# Patient Record
Sex: Female | Born: 1935
Health system: Southern US, Community
[De-identification: ages and names within clinical notes are randomized; demographics above are authoritative.]

## PROBLEM LIST (undated history)

## (undated) DIAGNOSIS — J349 Unspecified disorder of nose and nasal sinuses: Secondary | ICD-10-CM

## (undated) DIAGNOSIS — Z789 Other specified health status: Secondary | ICD-10-CM

## (undated) DIAGNOSIS — H9190 Unspecified hearing loss, unspecified ear: Secondary | ICD-10-CM

## (undated) HISTORY — PX: HERNIA REPAIR: SHX51

---

## 1999-05-26 ENCOUNTER — Other Ambulatory Visit: Admission: RE | Admit: 1999-05-26 | Discharge: 1999-05-26 | Payer: Self-pay | Admitting: Obstetrics and Gynecology

## 1999-05-26 ENCOUNTER — Ambulatory Visit (HOSPITAL_COMMUNITY): Admission: RE | Admit: 1999-05-26 | Discharge: 1999-05-26 | Payer: Self-pay | Admitting: Family Medicine

## 1999-05-26 ENCOUNTER — Encounter: Payer: Self-pay | Admitting: Family Medicine

## 2001-07-19 ENCOUNTER — Ambulatory Visit (HOSPITAL_COMMUNITY): Admission: RE | Admit: 2001-07-19 | Discharge: 2001-07-19 | Payer: Self-pay | Admitting: Family Medicine

## 2001-07-19 ENCOUNTER — Encounter: Payer: Self-pay | Admitting: Family Medicine

## 2001-08-11 ENCOUNTER — Encounter: Payer: Self-pay | Admitting: Emergency Medicine

## 2001-08-11 ENCOUNTER — Emergency Department (HOSPITAL_COMMUNITY): Admission: EM | Admit: 2001-08-11 | Discharge: 2001-08-11 | Payer: Self-pay | Admitting: Emergency Medicine

## 2001-08-20 ENCOUNTER — Ambulatory Visit (HOSPITAL_COMMUNITY): Admission: RE | Admit: 2001-08-20 | Discharge: 2001-08-20 | Payer: Self-pay | Admitting: Pulmonary Disease

## 2003-03-19 ENCOUNTER — Ambulatory Visit (HOSPITAL_COMMUNITY): Admission: RE | Admit: 2003-03-19 | Discharge: 2003-03-19 | Payer: Self-pay | Admitting: Obstetrics & Gynecology

## 2003-03-19 ENCOUNTER — Encounter: Payer: Self-pay | Admitting: Obstetrics & Gynecology

## 2003-03-26 ENCOUNTER — Encounter (HOSPITAL_COMMUNITY): Admission: RE | Admit: 2003-03-26 | Discharge: 2003-04-25 | Payer: Self-pay | Admitting: Obstetrics & Gynecology

## 2003-03-26 ENCOUNTER — Encounter: Payer: Self-pay | Admitting: Obstetrics & Gynecology

## 2004-06-13 ENCOUNTER — Emergency Department (HOSPITAL_COMMUNITY): Admission: EM | Admit: 2004-06-13 | Discharge: 2004-06-13 | Payer: Self-pay | Admitting: Emergency Medicine

## 2004-11-30 ENCOUNTER — Ambulatory Visit (HOSPITAL_COMMUNITY): Admission: RE | Admit: 2004-11-30 | Discharge: 2004-11-30 | Payer: Self-pay | Admitting: Family Medicine

## 2004-12-02 ENCOUNTER — Ambulatory Visit: Payer: Self-pay | Admitting: Internal Medicine

## 2004-12-07 ENCOUNTER — Encounter (HOSPITAL_COMMUNITY): Admission: RE | Admit: 2004-12-07 | Discharge: 2004-12-08 | Payer: Self-pay | Admitting: Internal Medicine

## 2004-12-07 ENCOUNTER — Ambulatory Visit: Payer: Self-pay | Admitting: *Deleted

## 2004-12-16 ENCOUNTER — Ambulatory Visit (HOSPITAL_COMMUNITY): Admission: RE | Admit: 2004-12-16 | Discharge: 2004-12-16 | Payer: Self-pay | Admitting: Internal Medicine

## 2004-12-16 ENCOUNTER — Ambulatory Visit: Payer: Self-pay | Admitting: *Deleted

## 2004-12-17 ENCOUNTER — Ambulatory Visit: Payer: Self-pay | Admitting: *Deleted

## 2004-12-21 ENCOUNTER — Ambulatory Visit (HOSPITAL_COMMUNITY): Admission: RE | Admit: 2004-12-21 | Discharge: 2004-12-21 | Payer: Self-pay | Admitting: Family Medicine

## 2005-01-19 ENCOUNTER — Ambulatory Visit: Payer: Self-pay | Admitting: *Deleted

## 2005-07-25 ENCOUNTER — Ambulatory Visit (HOSPITAL_COMMUNITY): Admission: RE | Admit: 2005-07-25 | Discharge: 2005-07-25 | Payer: Self-pay | Admitting: Family Medicine

## 2005-10-31 HISTORY — PX: COLONOSCOPY: SHX174

## 2006-04-04 ENCOUNTER — Ambulatory Visit (HOSPITAL_COMMUNITY): Admission: RE | Admit: 2006-04-04 | Discharge: 2006-04-04 | Payer: Self-pay | Admitting: Family Medicine

## 2006-04-20 ENCOUNTER — Ambulatory Visit (HOSPITAL_COMMUNITY): Admission: RE | Admit: 2006-04-20 | Discharge: 2006-04-20 | Payer: Self-pay | Admitting: Family Medicine

## 2006-05-11 ENCOUNTER — Ambulatory Visit: Payer: Self-pay | Admitting: Internal Medicine

## 2006-05-30 ENCOUNTER — Ambulatory Visit (HOSPITAL_COMMUNITY): Admission: RE | Admit: 2006-05-30 | Discharge: 2006-05-30 | Payer: Self-pay | Admitting: Internal Medicine

## 2006-05-30 ENCOUNTER — Ambulatory Visit: Payer: Self-pay | Admitting: Internal Medicine

## 2010-11-21 ENCOUNTER — Encounter: Payer: Self-pay | Admitting: *Deleted

## 2010-12-28 ENCOUNTER — Other Ambulatory Visit (HOSPITAL_COMMUNITY): Payer: Self-pay | Admitting: Family Medicine

## 2010-12-28 ENCOUNTER — Ambulatory Visit (HOSPITAL_COMMUNITY)
Admission: RE | Admit: 2010-12-28 | Discharge: 2010-12-28 | Disposition: A | Discharge status: Home / Self Care | Payer: Medicare Other | Source: Ambulatory Visit | Attending: Family Medicine | Admitting: Family Medicine

## 2010-12-28 DIAGNOSIS — R52 Pain, unspecified: Secondary | ICD-10-CM

## 2010-12-28 DIAGNOSIS — M79609 Pain in unspecified limb: Secondary | ICD-10-CM | POA: Insufficient documentation

## 2012-01-12 ENCOUNTER — Other Ambulatory Visit (HOSPITAL_COMMUNITY): Payer: Self-pay | Admitting: Family Medicine

## 2012-01-12 ENCOUNTER — Ambulatory Visit (HOSPITAL_COMMUNITY)
Admission: RE | Admit: 2012-01-12 | Discharge: 2012-01-12 | Disposition: A | Discharge status: Home / Self Care | Payer: Medicare Other | Source: Ambulatory Visit | Attending: Family Medicine | Admitting: Family Medicine

## 2012-01-12 DIAGNOSIS — M25551 Pain in right hip: Secondary | ICD-10-CM

## 2012-01-12 DIAGNOSIS — W19XXXA Unspecified fall, initial encounter: Secondary | ICD-10-CM

## 2012-01-12 DIAGNOSIS — M25559 Pain in unspecified hip: Secondary | ICD-10-CM | POA: Insufficient documentation

## 2012-01-12 DIAGNOSIS — M898X5 Other specified disorders of bone, thigh: Secondary | ICD-10-CM

## 2012-05-08 ENCOUNTER — Other Ambulatory Visit: Payer: Self-pay | Admitting: Obstetrics & Gynecology

## 2012-05-08 MED ORDER — KETOROLAC TROMETHAMINE 30 MG/ML IJ SOLN: 30.0000 mg | Freq: Once | INTRAMUSCULAR | Status: DC

## 2012-05-09 ENCOUNTER — Encounter (HOSPITAL_COMMUNITY): Payer: Self-pay | Admitting: Pharmacy Technician

## 2012-05-18 ENCOUNTER — Encounter (HOSPITAL_COMMUNITY)
Admission: RE | Admit: 2012-05-18 | Discharge: 2012-05-18 | Disposition: A | Discharge status: Home / Self Care | Payer: Medicare Other | Source: Ambulatory Visit | Attending: Obstetrics & Gynecology | Admitting: Obstetrics & Gynecology

## 2012-05-18 ENCOUNTER — Other Ambulatory Visit: Payer: Self-pay

## 2012-05-18 ENCOUNTER — Encounter (HOSPITAL_COMMUNITY): Payer: Self-pay

## 2012-05-18 HISTORY — DX: Other specified health status: Z78.9

## 2012-05-18 HISTORY — DX: Unspecified disorder of nose and nasal sinuses: J34.9

## 2012-05-18 HISTORY — DX: Unspecified hearing loss, unspecified ear: H91.90

## 2012-05-18 LAB — COMPREHENSIVE METABOLIC PANEL
ALT: 13 U/L (ref 0–35)
AST: 18 U/L (ref 0–37)
Albumin: 4.3 g/dL (ref 3.5–5.2)
CO2: 32 mEq/L (ref 19–32)
Calcium: 10.6 mg/dL — ABNORMAL HIGH (ref 8.4–10.5)
Chloride: 99 mEq/L (ref 96–112)
GFR calc non Af Amer: 78 mL/min — ABNORMAL LOW (ref >90)
Sodium: 140 mEq/L (ref 135–145)
Total Bilirubin: 0.7 mg/dL (ref 0.3–1.2)

## 2012-05-18 LAB — CBC
Platelets: 204 10*3/uL (ref 150–400)
RBC: 4.56 MIL/uL (ref 3.87–5.11)
RDW: 12.9 % (ref 11.5–15.5)
WBC: 6.5 10*3/uL (ref 4.0–10.5)

## 2012-05-18 LAB — SURGICAL PCR SCREEN
MRSA, PCR: NEGATIVE
Staphylococcus aureus: NEGATIVE

## 2012-05-18 NOTE — Patient Instructions (Addendum)
22 Candice Ray  05/18/2012   Your procedure is scheduled on:  Wednesday, 05/23/12  Report to [facility] at 0700 AM.  Call this number if you have problems the morning of surgery: [phone]   Remember:   Do not eat food:After Midnight.  May have clear liquids:until Midnight .  NOTHING after midnight, Tuesday!  Take these medicines the morning of surgery with A SIP OF WATER: Please DO NOT take any of your diabetic medications. We will check your blood sugar upon arrival to the short stay area.   Do not wear jewelry, make-up or nail polish.  Do not wear lotions, powders, or perfumes. You may wear deodorant.  Do not shave 48 hours prior to surgery. Men may shave face and neck.  Do not bring valuables to the hospital.  Contacts, dentures or bridgework may not be worn into surgery.  Leave suitcase in the car. After surgery it may be brought to your room.  For patients admitted to the hospital, checkout time is 11:00 AM the day of discharge.   Patients discharged the day of surgery will not be allowed to drive home.  Name and phone number of your driver: family  Special Instructions: CHG Shower Use Special Wash: 1/2 bottle night before surgery and 1/2 bottle morning of surgery.   Please read over the following fact sheets that you were given: Pain Booklet, MRSA Information, Surgical Site Infection Prevention, Anesthesia Post-op Instructions and Care and Recovery After Surgery   Endometrial Ablation Endometrial ablation removes the lining of the uterus (endometrium). It is usually a same day, outpatient treatment. Ablation helps avoid major surgery (such as a hysterectomy). A hysterectomy is removal of the cervix and uterus. Endometrial ablation has less risk and complications, has a shorter recovery period and is less expensive. After endometrial ablation, most women will have little or no menstrual bleeding. You may not keep your fertility. Pregnancy is no longer likely after this procedure  but if you are pre-menopausal, you still need to use a reliable method of birth control following the procedure because pregnancy can occur. REASONS TO HAVE THE PROCEDURE MAY INCLUDE:  Heavy periods.   Bleeding that is causing anemia.   Anovulatory bleeding, very irregular, bleeding.   Bleeding submucous fibroids (on the lining inside the uterus) if they are smaller than 3 centimeters.  REASONS NOT TO HAVE THE PROCEDURE MAY INCLUDE:  You wish to have more children.   You have a pre-cancerous or cancerous problem. The cause of any abnormal bleeding must be diagnosed before having the procedure.   You have pain coming from the uterus.   You have a submucus fibroid larger than 3 centimeters.   You recently had a baby.   You recently had an infection in the uterus.   You have a severe retro-flexed, tipped uterus and cannot insert the instrument to do the ablation.   You had a Cesarean section or deep major surgery on the uterus.   The inner cavity of the uterus is too large for the endometrial ablation instrument.  RISKS AND COMPLICATIONS   Perforation of the uterus.   Bleeding.   Infection of the uterus, bladder or vagina.   Injury to surrounding organs.   Cutting the cervix.   An air bubble to the lung (air embolus).   Pregnancy following the procedure.   Failure of the procedure to help the problem requiring hysterectomy.   Decreased ability to diagnose cancer in the lining of the uterus.  BEFORE  THE PROCEDURE  The lining of the uterus must be tested to make sure there is no pre-cancerous or cancer cells present.   Medications may be given to make the lining of the uterus thinner.   Ultrasound may be used to evaluate the size and look for abnormalities of the uterus.   Future pregnancy is not desired.  PROCEDURE  There are different ways to destroy the lining of the uterus.   Resectoscope - radio frequency-alternating electric current is the most common one  used.   Cryotherapy - freezing the lining of the uterus.   Heated Free Liquid - heated salt (saline) solution inserted into the uterus.   Microwave - uses high energy microwaves in the uterus.   Thermal Balloon - a catheter with a balloon tip is inserted into the uterus and filled with heated fluid.  Your caregiver will talk with you about the method used in this clinic. They will also instruct you on the pros and cons of the procedure. Endometrial ablation is performed along with a procedure called operative hysteroscopy. A narrow viewing tube is inserted through the birth canal (vagina) and through the cervix into the uterus. A tiny camera attached to the viewing tube (hysteroscope) allows the uterine cavity to be shown on a TV monitor during surgery. Your uterus is filled with a harmless liquid to make the procedure easier. The lining of the uterus is then removed. The lining can also be removed with a resectoscope which allows your surgeon to cut away the lining of the uterus under direct vision. Usually, you will be able to go home within an hour after the procedure. HOME CARE INSTRUCTIONS   Do not drive for 24 hours.   No tampons, douching or intercourse for 2 weeks or until your caregiver approves.   Rest at home for 24 to 48 hours. You may then resume normal activities unless told differently by your caregiver.   Take your temperature two times a day for 4 days, and record it.   Take any medications your caregiver has ordered, as directed.   Use some form of contraception if you are pre-menopausal and do not want to get pregnant.  Bleeding after the procedure is normal. It varies from light spotting and mildly watery to bloody discharge for 4 to 6 weeks. You may also have mild cramping. Only take over-the-counter or prescription medicines for pain, discomfort, or fever as directed by your caregiver. Do not use aspirin, as this may aggravate bleeding. Frequent urination during the first  24 hours is normal. You will not know how effective your surgery is until at least 3 months after the surgery. SEEK IMMEDIATE MEDICAL CARE IF:   Bleeding is heavier than a normal menstrual cycle.   An oral temperature above 102 F (38.9 C) develops.   You have increasing cramps or pains not relieved with medication or develop belly (abdominal) pain which does not seem to be related to the same area of earlier cramping and pain.   You are light headed, weak or have fainting episodes.   You develop pain in the shoulder strap areas.   You have chest or leg pain.   You have abnormal vaginal discharge.   You have painful urination.  Document Released: 08/26/2004 Document Revised: 10/06/2011 Document Reviewed: 11/24/2007 [facility] Patient Information 2012 [place], [place].Dilation and Curettage or Vacuum Curettage Care After These instructions give you information on caring for yourself after your procedure. Your doctor may also give you more specific instructions.  Call your doctor if you have any problems or questions after your procedure. HOME CARE  Do not drive for 24 hours.   Return to normal activities in 1 week.   Take your temperature 2 times a day for 4 days. Write it down. Tell your doctor if you have a fever.   Do not stand for a long time.   Do not lift, push, or pull anything over 10 pounds (4.5 kilograms).   Limit stair climbing to once or twice a day.   Rest often.   Continue with your usual diet.   Drink enough fluids to keep your pee (urine) clear or pale yellow.   If you have dry, hard poop (constipation), you may:   Take a medicine to help you go poop (laxative) as told by your doctor.   Add fruit and bran to your diet.   Take showers, not baths, for as long as told by your doctor.   Do not swim or use a hot tub until your doctor says it is okay.   Have someone with you for 2 to 4 days after the procedure.   Do not douche, use tampons, or have sex  (intercourse) until your doctor says it is okay.   Only take medicines as told by your doctor. Do not take aspirin. It can cause bleeding.   Keep all doctor visits.  GET HELP RIGHT AWAY IF:   You start to bleed more than a regular period.   You have a fever.   You have chest pain.   You have trouble breathing.   You feel dizzy or feel like passing out (fainting).   You pass out.   You have pain in the tops of your shoulders.   You have vaginal bleeding with or without clumps (clots) of blood.   You have cramps or pain not helped by medicine.   You have belly (abdominal) pain.   You have a bad smelling fluid coming from your vagina.   You get a rash.   You have problems with any medicine.  MAKE SURE YOU:   Understand these instructions.   Will watch your condition.   Will get help right away if you are not doing well or get worse.  Document Released: 07/26/2008 Document Revised: 10/06/2011 Document Reviewed: 07/26/2008 [facility] Patient Information 2012 [place], [place].

## 2012-05-23 ENCOUNTER — Ambulatory Visit (HOSPITAL_COMMUNITY): Payer: Medicare Other | Admitting: Anesthesiology

## 2012-05-23 ENCOUNTER — Encounter (HOSPITAL_COMMUNITY)
Admission: RE | Disposition: A | Discharge status: Home / Self Care | Payer: Self-pay | Source: Ambulatory Visit | Attending: Obstetrics & Gynecology

## 2012-05-23 ENCOUNTER — Encounter (HOSPITAL_COMMUNITY): Payer: Self-pay | Admitting: Anesthesiology

## 2012-05-23 ENCOUNTER — Ambulatory Visit (HOSPITAL_COMMUNITY)
Admission: RE | Admit: 2012-05-23 | Discharge: 2012-05-23 | Disposition: A | Discharge status: Home / Self Care | Payer: Medicare Other | Source: Ambulatory Visit | Attending: Obstetrics & Gynecology | Admitting: Obstetrics & Gynecology

## 2012-05-23 ENCOUNTER — Encounter (HOSPITAL_COMMUNITY): Payer: Self-pay | Admitting: *Deleted

## 2012-05-23 DIAGNOSIS — N840 Polyp of corpus uteri: Secondary | ICD-10-CM

## 2012-05-23 DIAGNOSIS — Z01812 Encounter for preprocedural laboratory examination: Secondary | ICD-10-CM | POA: Insufficient documentation

## 2012-05-23 DIAGNOSIS — Z01810 Encounter for preprocedural cardiovascular examination: Secondary | ICD-10-CM | POA: Insufficient documentation

## 2012-05-23 DIAGNOSIS — E119 Type 2 diabetes mellitus without complications: Secondary | ICD-10-CM | POA: Insufficient documentation

## 2012-05-23 HISTORY — PX: HYSTEROSCOPY WITH D & C: SHX1775

## 2012-05-23 HISTORY — PX: POLYPECTOMY: SHX5525

## 2012-05-23 LAB — GLUCOSE, CAPILLARY: Glucose-Capillary: 174 mg/dL — ABNORMAL HIGH (ref 70–99)

## 2012-05-23 SURGERY — POLYPECTOMY
Anesthesia: General | Site: Uterus | Wound class: Clean Contaminated

## 2012-05-23 MED ORDER — ONDANSETRON HCL 4 MG/2ML IJ SOLN: 4.0000 mg | Freq: Once | INTRAMUSCULAR | Status: DC | PRN

## 2012-05-23 MED ORDER — KETOROLAC TROMETHAMINE 30 MG/ML IJ SOLN
INTRAMUSCULAR | Status: AC
  Filled 2012-05-23: qty 1

## 2012-05-23 MED ORDER — CEFAZOLIN SODIUM-DEXTROSE 2-3 GM-% IV SOLR: 2.0000 g | INTRAVENOUS | Status: DC

## 2012-05-23 MED ORDER — LACTATED RINGERS IV SOLN
INTRAVENOUS | Status: DC
  Administered 2012-05-23: 1000 mL via INTRAVENOUS

## 2012-05-23 MED ORDER — FENTANYL CITRATE 0.05 MG/ML IJ SOLN
INTRAMUSCULAR | Status: AC
  Filled 2012-05-23: qty 2

## 2012-05-23 MED ORDER — FENTANYL CITRATE 0.05 MG/ML IJ SOLN: 25.0000 ug | INTRAMUSCULAR | Status: DC | PRN

## 2012-05-23 MED ORDER — 0.9 % SODIUM CHLORIDE (POUR BTL) OPTIME
TOPICAL | Status: DC | PRN
  Administered 2012-05-23: 1000 mL

## 2012-05-23 MED ORDER — MIDAZOLAM HCL 2 MG/2ML IJ SOLN
1.0000 mg | INTRAMUSCULAR | Status: DC | PRN
  Administered 2012-05-23: 2 mg via INTRAVENOUS

## 2012-05-23 MED ORDER — LIDOCAINE HCL (PF) 1 % IJ SOLN
INTRAMUSCULAR | Status: AC
  Filled 2012-05-23: qty 5

## 2012-05-23 MED ORDER — MIDAZOLAM HCL 2 MG/2ML IJ SOLN
INTRAMUSCULAR | Status: AC
  Filled 2012-05-23: qty 2

## 2012-05-23 MED ORDER — PROPOFOL 10 MG/ML IV BOLUS
INTRAVENOUS | Status: DC | PRN
  Administered 2012-05-23: 120 mg via INTRAVENOUS

## 2012-05-23 MED ORDER — FENTANYL CITRATE 0.05 MG/ML IJ SOLN
INTRAMUSCULAR | Status: DC | PRN
  Administered 2012-05-23: 50 ug via INTRAVENOUS
  Administered 2012-05-23: 25 ug via INTRAVENOUS

## 2012-05-23 MED ORDER — ONDANSETRON HCL 8 MG PO TABS: 8.0000 mg | ORAL_TABLET | Freq: Three times a day (TID) | ORAL | Status: AC | PRN

## 2012-05-23 MED ORDER — SODIUM CHLORIDE 0.9 % IR SOLN
Status: DC | PRN
  Administered 2012-05-23: 3000 mL

## 2012-05-23 MED ORDER — ONDANSETRON HCL 4 MG/2ML IJ SOLN
INTRAMUSCULAR | Status: AC
  Filled 2012-05-23: qty 2

## 2012-05-23 MED ORDER — HYDROCODONE-ACETAMINOPHEN 5-500 MG PO TABS: 1.0000 | ORAL_TABLET | Freq: Four times a day (QID) | ORAL | Status: AC | PRN

## 2012-05-23 MED ORDER — CEFAZOLIN SODIUM 1-5 GM-% IV SOLN
INTRAVENOUS | Status: DC | PRN
  Administered 2012-05-23: 2 g via INTRAVENOUS

## 2012-05-23 MED ORDER — LIDOCAINE HCL 1 % IJ SOLN
INTRAMUSCULAR | Status: DC | PRN
  Administered 2012-05-23: 50 mg via INTRADERMAL

## 2012-05-23 MED ORDER — CEFAZOLIN SODIUM-DEXTROSE 2-3 GM-% IV SOLR
INTRAVENOUS | Status: AC
  Filled 2012-05-23: qty 50

## 2012-05-23 MED ORDER — KETOROLAC TROMETHAMINE 10 MG PO TABS: 10.0000 mg | ORAL_TABLET | Freq: Three times a day (TID) | ORAL | Status: AC | PRN

## 2012-05-23 MED ORDER — PROPOFOL 10 MG/ML IV EMUL
INTRAVENOUS | Status: AC
  Filled 2012-05-23: qty 20

## 2012-05-23 MED ORDER — KETOROLAC TROMETHAMINE 30 MG/ML IJ SOLN
30.0000 mg | Freq: Once | INTRAMUSCULAR | Status: AC
  Administered 2012-05-23: 30 mg via INTRAVENOUS

## 2012-05-23 SURGICAL SUPPLY — 37 items
BAG DECANTER FOR FLEXI CONT (MISCELLANEOUS) IMPLANT
BAG HAMPER (MISCELLANEOUS) ×3 IMPLANT
BLADE SURG 15 STRL LF DISP TIS (BLADE) IMPLANT
BLADE SURG 15 STRL SS (BLADE)
CATH THERMACHOICE III (CATHETERS) IMPLANT
CLOTH BEACON ORANGE TIMEOUT ST (SAFETY) ×3 IMPLANT
COVER LIGHT HANDLE STERIS (MISCELLANEOUS) ×6 IMPLANT
FORMALIN 10 PREFIL 120ML (MISCELLANEOUS) ×3 IMPLANT
FORMALIN 10 PREFIL 480ML (MISCELLANEOUS) IMPLANT
GAUZE SPONGE 4X4 16PLY XRAY LF (GAUZE/BANDAGES/DRESSINGS) ×3 IMPLANT
GLOVE BIOGEL PI IND STRL 7.0 (GLOVE) ×4 IMPLANT
GLOVE BIOGEL PI IND STRL 8 (GLOVE) ×2 IMPLANT
GLOVE BIOGEL PI INDICATOR 7.0 (GLOVE) ×2
GLOVE BIOGEL PI INDICATOR 8 (GLOVE) ×1
GLOVE ECLIPSE 6.5 STRL STRAW (GLOVE) ×3 IMPLANT
GLOVE ECLIPSE 8.0 STRL XLNG CF (GLOVE) ×3 IMPLANT
GLOVE EXAM NITRILE MD LF STRL (GLOVE) ×3 IMPLANT
GOWN STRL REIN XL XLG (GOWN DISPOSABLE) ×9 IMPLANT
INST SET HYSTEROSCOPY (KITS) ×3 IMPLANT
IV D5W 500ML (IV SOLUTION) IMPLANT
IV NS IRRIG 3000ML ARTHROMATIC (IV SOLUTION) ×3 IMPLANT
KIT ROOM TURNOVER AP CYSTO (KITS) ×3 IMPLANT
KIT ROOM TURNOVER APOR (KITS) IMPLANT
MANIFOLD NEPTUNE II (INSTRUMENTS) ×3 IMPLANT
MARKER SKIN DUAL TIP RULER LAB (MISCELLANEOUS) ×3 IMPLANT
NS IRRIG 1000ML POUR BTL (IV SOLUTION) ×3 IMPLANT
PACK BASIC III (CUSTOM PROCEDURE TRAY) ×1
PACK PERI GYN (CUSTOM PROCEDURE TRAY) IMPLANT
PACK SRG BSC III STRL LF ECLPS (CUSTOM PROCEDURE TRAY) ×2 IMPLANT
PAD ARMBOARD 7.5X6 YLW CONV (MISCELLANEOUS) ×3 IMPLANT
PAD TELFA 3X4 1S STER (GAUZE/BANDAGES/DRESSINGS) ×3 IMPLANT
SET BASIN LINEN APH (SET/KITS/TRAYS/PACK) ×3 IMPLANT
SET IRRIG Y TYPE TUR BLADDER L (SET/KITS/TRAYS/PACK) ×3 IMPLANT
SHEET LAVH (DRAPES) ×3 IMPLANT
SUT MNCRL 0 VIOLET CTX 36 (SUTURE) IMPLANT
SUT MONOCRYL 0 CTX 36 (SUTURE)
YANKAUER SUCT BULB TIP 10FT TU (MISCELLANEOUS) ×3 IMPLANT

## 2012-05-23 NOTE — H&P (Signed)
Candice Ray is an 78 y.o. female long menopausal who had evaluation for pelvic pain which included an ultrasound which revealed an endometrial polyp.  Biopsy in the office was consistent with that and was benign.  We will do an operative hysteroscopy and removal of polyp and uterine curettage.  No LMP recorded. Patient is postmenopausal.    Past Medical History  Diagnosis Date  . Diabetes mellitus   . Sinus trouble   . HOH (hard of hearing)   . Poor historian     Past Surgical History  Procedure Date  . Hernia repair     umbilical, [facility]    History reviewed. No pertinent family history.  Social History:  reports that she has never smoked. She does not have any smokeless tobacco history on file. She reports that she does not drink alcohol or use illicit drugs.  Allergies:  Allergies  Allergen Reactions  . Contrast Media (Iodinated Diagnostic Agents) Swelling    Prescriptions prior to admission  Medication Sig Dispense Refill  . Calcium Carbonate-Vitamin D (CALTRATE 600+D PO) Take 1 tablet by mouth 2 (two) times daily.      . metFORMIN (GLUCOPHAGE) 500 MG tablet Take 500 mg by mouth 2 (two) times daily.      . Multiple Vitamin (MULTIVITAMIN WITH MINERALS) TABS Take 1 tablet by mouth daily.      . saxagliptin HCl (ONGLYZA) 5 MG TABS tablet Take 5 mg by mouth daily.        ROS  Review of Systems  Constitutional: Negative for fever, chills, weight loss, malaise/fatigue and diaphoresis.  HENT: Negative for hearing loss, ear pain, nosebleeds, congestion, sore throat, neck pain, tinnitus and ear discharge.   Eyes: Negative for blurred vision, double vision, photophobia, pain, discharge and redness.  Respiratory: Negative for cough, hemoptysis, sputum production, shortness of breath, wheezing and stridor.   Cardiovascular: Negative for chest pain, palpitations, orthopnea, claudication, leg swelling and PND.  Gastrointestinal: Positive for abdominal pain. Negative for heartburn,  nausea, vomiting, diarrhea, constipation, blood in stool and melena.  Genitourinary: Negative for dysuria, urgency, frequency, hematuria and flank pain.  Musculoskeletal: Negative for myalgias, back pain, joint pain and falls.  Skin: Negative for itching and rash.  Neurological: Negative for dizziness, tingling, tremors, sensory change, speech change, focal weakness, seizures, loss of consciousness, weakness and headaches.  Endo/Heme/Allergies: Negative for environmental allergies and polydipsia. Does not bruise/bleed easily.  Psychiatric/Behavioral: Negative for depression, suicidal ideas, hallucinations, memory loss and substance abuse. The patient is not nervous/anxious and does not have insomnia.      Blood pressure 128/85, pulse 107, temperature 98.1 F (36.7 C), temperature source Oral, resp. rate 19, height 5\' 5"  (1.651 m), weight 77.111 kg (170 lb), SpO2 93.00%. Physical Exam Physical Exam  Vitals reviewed. Constitutional: She is oriented to person, place, and time. She appears well-developed and well-nourished.  HENT:  Head: Normocephalic and atraumatic.  Right Ear: External ear normal.  Left Ear: External ear normal.  Nose: Nose normal.  Mouth/Throat: Oropharynx is clear and moist.  Eyes: Conjunctivae and EOM are normal. Pupils are equal, round, and reactive to light. Right eye exhibits no discharge. Left eye exhibits no discharge. No scleral icterus.  Neck: Normal range of motion. Neck supple. No tracheal deviation present. No thyromegaly present.  Cardiovascular: Normal rate, regular rhythm, normal heart sounds and intact distal pulses.  Exam reveals no gallop and no friction rub.   No murmur heard. Respiratory: Effort normal and breath sounds normal. No respiratory distress.  She has no wheezes. She has no rales. She exhibits no tenderness.  GI: Soft. Bowel sounds are normal. She exhibits no distension and no mass. There is tenderness. There is no rebound and no guarding.    Genitourinary:       Vulva is normal without lesions Vagina is pink moist without discharge Cervix normal in appearance and pap is normal Uterus is normal except endometrial polyp Adnexa is negative  by sonogram  Musculoskeletal: Normal range of motion. She exhibits no edema and no tenderness.  Neurological: She is alert and oriented to person, place, and time. She has normal reflexes. She displays normal reflexes. No cranial nerve deficit. She exhibits normal muscle tone. Coordination normal.  Skin: Skin is warm and dry. No rash noted. No erythema. No pallor.  Psychiatric: She has a normal mood and affect. Her behavior is normal. Judgment and thought content normal.   Recent Results (from the past 336 hour(s))  SURGICAL PCR SCREEN   Collection Time   05/18/12  7:36 AM      Component Value Range   MRSA, PCR NEGATIVE  NEGATIVE   Staphylococcus aureus NEGATIVE  NEGATIVE  CBC   Collection Time   05/18/12  8:00 AM      Component Value Range   WBC 6.5  4.0 - 10.5 K/uL   RBC 4.56  3.87 - 5.11 MIL/uL   Hemoglobin 13.5  12.0 - 15.0 g/dL   HCT 82.9  56.2 - 13.0 %   MCV 87.1  78.0 - 100.0 fL   MCH 29.6  26.0 - 34.0 pg   MCHC 34.0  30.0 - 36.0 g/dL   RDW 86.5  78.4 - 69.6 %   Platelets 204  150 - 400 K/uL  COMPREHENSIVE METABOLIC PANEL   Collection Time   05/18/12  8:00 AM      Component Value Range   Sodium 140  135 - 145 mEq/L   Potassium 4.4  3.5 - 5.1 mEq/L   Chloride 99  96 - 112 mEq/L   CO2 32  19 - 32 mEq/L   Glucose, Bld 213 (*) 70 - 99 mg/dL   BUN 13  6 - 23 mg/dL   Creatinine, Ser 2.95  0.50 - 1.10 mg/dL   Calcium 28.4 (*) 8.4 - 10.5 mg/dL   Total Protein 7.0  6.0 - 8.3 g/dL   Albumin 4.3  3.5 - 5.2 g/dL   AST 18  0 - 37 U/L   ALT 13  0 - 35 U/L   Alkaline Phosphatase 47  39 - 117 U/L   Total Bilirubin 0.7  0.3 - 1.2 mg/dL   GFR calc non Af Amer 78 (*) >90 mL/min   GFR calc Af Amer >90  >90 mL/min  GLUCOSE, CAPILLARY   Collection Time   05/23/12  7:01 AM       Component Value Range   Glucose-Capillary 198 (*) 70 - 99 mg/dL     Results for orders placed during the hospital encounter of 05/23/12 (from the past 24 hour(s))  GLUCOSE, CAPILLARY     Status: Abnormal   Collection Time   05/23/12  7:01 AM      Component Value Range   Glucose-Capillary 198 (*) 70 - 99 mg/dL    No results found.  Assessment/Plan: 1.  Post menopausal female with endometrial polyp  Patient understands reason for procedure and removal.  Alichia Alridge H 05/23/2012, 8:39 AM

## 2012-05-23 NOTE — Anesthesia Preprocedure Evaluation (Signed)
Anesthesia Evaluation  Patient identified by MRN, date of birth, ID band Patient awake    Reviewed: Allergy & Precautions, H&P , NPO status , Patient's Chart, lab work & pertinent test results  Airway Mallampati: I      Dental  (+) Edentulous Upper and Partial Lower   Pulmonary neg pulmonary ROS,  breath sounds clear to auscultation        Cardiovascular negative cardio ROS  Rhythm:Regular     Neuro/Psych    GI/Hepatic   Endo/Other  Poorly Controlled, Type 2, Oral Hypoglycemic Agents  Renal/GU      Musculoskeletal   Abdominal   Peds  Hematology   Anesthesia Other Findings   Reproductive/Obstetrics                           Anesthesia Physical Anesthesia Plan  ASA: III  Anesthesia Plan: General   Post-op Pain Management:    Induction: Intravenous  Airway Management Planned: LMA  Additional Equipment:   Intra-op Plan:   Post-operative Plan: Extubation in OR  Informed Consent: I have reviewed the patients History and Physical, chart, labs and discussed the procedure including the risks, benefits and alternatives for the proposed anesthesia with the patient or authorized representative who has indicated his/her understanding and acceptance.     Plan Discussed with:   Anesthesia Plan Comments:         Anesthesia Quick Evaluation

## 2012-05-23 NOTE — Anesthesia Postprocedure Evaluation (Signed)
  Anesthesia Post-op Note  Patient: Candice Ray  Procedure(s) Performed: Procedure(s) (LRB): POLYPECTOMY (N/A) DILATATION AND CURETTAGE /HYSTEROSCOPY (N/A)  Patient Location: PACU  Anesthesia Type: General  Level of Consciousness: awake, alert , oriented and patient cooperative  Airway and Oxygen Therapy: Patient Spontanous Breathing  Post-op Pain: none  Post-op Assessment: Post-op Vital signs reviewed, Patient's Cardiovascular Status Stable, Respiratory Function Stable, Patent Airway and No signs of Nausea or vomiting  Post-op Vital Signs: Reviewed and stable  Complications: No apparent anesthesia complications

## 2012-05-23 NOTE — Op Note (Signed)
Preoperative diagnosis:  Endometrial polyp  Postoperative diagnosis:  Same as above  Procedure:  Operative hysteroscopy with removal of endometrial polyp and uterine curettage  Surgeon:  Duane Lope H  Anesthesia:  Laryngeal mask airway  Findings:  Patient had an outpatient workup for pelvic pain which included an ultrasound.  The ultrasound was normal Except for a thickened endometrium consistent with an endometrial polyp.  I did an endometrial biopsy in the office which returned as benign and fragments of an endometrial polyp.  As a result she is brought in today for an outpatient operative hysteroscopy with removal of polyp.  At the time of surgery today the patient has a single fundal benign appearing endometrial polyp.  The rest of the endometrium was normal.  Description of operation:  The patient was brought to the operating room and placed in the supine position where she underwent laryngeal mask airway anesthesia.  She was placed in the dorsal lithotomy position prepped and draped in the usual sterile fashion.  A Graves speculum was placed.  The cervix was grasped with a single-tooth tenaculum.  The cervix was dilated using Hegar dilators.  The hysteroscope was placed.   Normal saline was used as a distending media.  The endometrial polyp was found to be at the fundus single and benign-appearing.  The rest of the endometrium was normal.  The polyp was removed using forceps.  The endometrium was then scraped with a curet with no real significant specimen obtained.  The patient was awakened from anesthesia taken to the recovery room in good stable condition with all counts being correct she received 1 g of Ancef preoperatively.  She'll be discharged time from the PACU.  Ela Moffat H 9:34 AM 05/23/2012

## 2012-05-23 NOTE — Transfer of Care (Signed)
Immediate Anesthesia Transfer of Care Note  Patient: Candice Ray  Procedure(s) Performed: Procedure(s) (LRB): POLYPECTOMY (N/A) DILATATION AND CURETTAGE /HYSTEROSCOPY (N/A)  Patient Location: PACU  Anesthesia Type: General  Level of Consciousness: awake and patient cooperative  Airway & Oxygen Therapy: Patient Spontanous Breathing and Patient connected to face mask oxygen  Post-op Assessment: Report given to PACU RN, Post -op Vital signs reviewed and stable and Patient moving all extremities  Post vital signs: Reviewed and stable  Complications: No apparent anesthesia complications

## 2012-05-28 ENCOUNTER — Encounter (HOSPITAL_COMMUNITY): Payer: Self-pay | Admitting: Obstetrics & Gynecology

## 2012-07-27 ENCOUNTER — Ambulatory Visit (HOSPITAL_COMMUNITY)
Admission: RE | Admit: 2012-07-27 | Discharge: 2012-07-27 | Disposition: A | Discharge status: Home / Self Care | Payer: Medicare Other | Source: Ambulatory Visit | Attending: Family Medicine | Admitting: Family Medicine

## 2012-07-27 ENCOUNTER — Other Ambulatory Visit (HOSPITAL_COMMUNITY): Payer: Self-pay | Admitting: Family Medicine

## 2012-07-27 DIAGNOSIS — M25559 Pain in unspecified hip: Secondary | ICD-10-CM | POA: Insufficient documentation

## 2012-07-27 DIAGNOSIS — G8929 Other chronic pain: Secondary | ICD-10-CM

## 2013-01-15 ENCOUNTER — Other Ambulatory Visit: Payer: Self-pay | Admitting: Obstetrics & Gynecology

## 2013-01-15 MED ORDER — KETOROLAC TROMETHAMINE 10 MG PO TABS: 10.0000 mg | ORAL_TABLET | Freq: Three times a day (TID) | ORAL | Status: DC | PRN

## 2013-11-20 ENCOUNTER — Other Ambulatory Visit (HOSPITAL_COMMUNITY): Payer: Self-pay | Admitting: Family Medicine

## 2013-11-20 DIAGNOSIS — R109 Unspecified abdominal pain: Secondary | ICD-10-CM

## 2013-11-22 ENCOUNTER — Other Ambulatory Visit (HOSPITAL_COMMUNITY): Payer: Self-pay | Admitting: Family Medicine

## 2013-11-22 ENCOUNTER — Ambulatory Visit (HOSPITAL_COMMUNITY)
Admission: RE | Admit: 2013-11-22 | Discharge: 2013-11-22 | Disposition: A | Discharge status: Home / Self Care | Payer: Medicare Other | Source: Ambulatory Visit | Attending: Family Medicine | Admitting: Family Medicine

## 2013-11-22 DIAGNOSIS — R1031 Right lower quadrant pain: Secondary | ICD-10-CM | POA: Insufficient documentation

## 2013-11-22 DIAGNOSIS — R109 Unspecified abdominal pain: Secondary | ICD-10-CM

## 2013-12-24 ENCOUNTER — Encounter: Payer: Self-pay | Admitting: Gastroenterology

## 2013-12-24 ENCOUNTER — Ambulatory Visit (INDEPENDENT_AMBULATORY_CARE_PROVIDER_SITE_OTHER): Payer: Medicare Other | Admitting: Gastroenterology

## 2013-12-24 VITALS — BP 136/78 | HR 109 | Temp 97.9°F | Ht 61.0 in | Wt 172.2 lb

## 2013-12-24 DIAGNOSIS — R197 Diarrhea, unspecified: Secondary | ICD-10-CM

## 2013-12-24 DIAGNOSIS — R1031 Right lower quadrant pain: Secondary | ICD-10-CM | POA: Insufficient documentation

## 2013-12-24 DIAGNOSIS — Z860101 Personal history of adenomatous and serrated colon polyps: Secondary | ICD-10-CM | POA: Insufficient documentation

## 2013-12-24 DIAGNOSIS — R198 Other specified symptoms and signs involving the digestive system and abdomen: Secondary | ICD-10-CM

## 2013-12-24 DIAGNOSIS — K529 Noninfective gastroenteritis and colitis, unspecified: Secondary | ICD-10-CM

## 2013-12-24 DIAGNOSIS — Z86010 Personal history of colonic polyps: Secondary | ICD-10-CM

## 2013-12-24 DIAGNOSIS — R194 Change in bowel habit: Secondary | ICD-10-CM | POA: Insufficient documentation

## 2013-12-24 MED ORDER — PEG 3350-KCL-NA BICARB-NACL 420 G PO SOLR: 4000.0000 mL | ORAL | Status: DC

## 2013-12-24 NOTE — Patient Instructions (Signed)
1. Please have your labs done and take stool specimen to lab. 2. We have scheduled you for a colonoscopy with Dr. Gala Romney. Please see separate instructions.  3. Apply warm compress five times a day on your sore. You may try neosporin per directions. If no improvement in couple of days, call Dr. Everette Rank.

## 2013-12-24 NOTE — Progress Notes (Signed)
Primary Care Physician:  MCINNIS,ANGUS G, MD  Primary Gastroenterologist:  Michael Rourk, MD   Chief Complaint  Patient presents with  . Diarrhea    HPI:  Candice Ray is a 80 y.o. female here for further evaluation of RLQ pain, diarrhea. Patient last seen by our practice room 2007 when she had her colonoscopy. Previously evaluated on multiple occasions for right lower quadrant pain, constipation. Colonoscopy in 2007 revealed multiple rectal tubular adenomas.   C/o new RLQ pain since summer time. Watery stools for awhile, six months or so. BM 4-5 per day. But if stools just soft, then 2-3 per day. Rare hard stool. No melena, brbpr. At times nauseated but no vomiting. Denies weight loss, heartburn, dysphagia. Complains of a sore in her genitalia region which has had some drainage. Began about 4 days ago. Feels better in the last day or 2 however.   Recent noncontrast CT abdomen and pelvis was unremarkable.  Please note, patient is a very difficult historian in part due to her difficulty hearing  Current Outpatient Prescriptions  Medication Sig Dispense Refill  . Calcium Carbonate-Vitamin D (CALTRATE 600+D PO) Take 1 tablet by mouth 2 (two) times daily.      . metFORMIN (GLUCOPHAGE) 500 MG tablet Take 500 mg by mouth 2 (two) times daily.      . Multiple Vitamin (MULTIVITAMIN WITH MINERALS) TABS Take 1 tablet by mouth daily.      . saxagliptin HCl (ONGLYZA) 5 MG TABS tablet Take 5 mg by mouth daily.      . polyethylene glycol-electrolytes (TRILYTE) 420 G solution Take 4,000 mLs by mouth as directed.  4000 mL  0   No current facility-administered medications for this visit.    Allergies as of 12/24/2013 - Review Complete 12/24/2013  Allergen Reaction Noted  . Contrast media [iodinated diagnostic agents] Swelling 05/23/2012    Past Medical History  Diagnosis Date  . Diabetes mellitus   . Sinus trouble   . HOH (hard of hearing)   . Poor historian     Past Surgical History   Procedure Laterality Date  . Hernia repair      umbilical, [facility]  . Polypectomy  05/23/2012    Procedure: POLYPECTOMY;  Surgeon: Luther H Eure, MD;  Location: [facility];  Service: Gynecology;  Laterality: N/A;  endometrial polypectomy  . Hysteroscopy w/d&c  05/23/2012    Procedure: DILATATION AND CURETTAGE /HYSTEROSCOPY;  Surgeon: Luther H Eure, MD;  Location: [facility];  Service: Gynecology;  Laterality: N/A;  . Colonoscopy  2007    Dr. Rourk: tubular adenomas/rectal    Family History  Problem Relation Age of Onset  . Colon cancer Neg Hx     History   Social History  . Marital Status: Married    Spouse Name: N/A    Number of Children: N/A  . Years of Education: N/A   Occupational History  . Not on file.   Social History Main Topics  . Smoking status: Never Smoker   . Smokeless tobacco: Not on file  . Alcohol Use: No  . Drug Use: No  . Sexual Activity:    Other Topics Concern  . Not on file   Social History Narrative  . No narrative on file      ROS:  General: Negative for anorexia, weight loss, fever, chills, fatigue, weakness. Eyes: Negative for vision changes.  ENT: Negative for hoarseness, difficulty swallowing , nasal congestion. CV: Negative for chest pain, angina, palpitations, dyspnea on exertion, peripheral   edema.  Respiratory: Negative for dyspnea at rest, dyspnea on exertion, cough, sputum, wheezing.  GI: See history of present illness. GU:  Negative for dysuria, hematuria, urinary incontinence, urinary frequency, nocturnal urination. See hpi. MS: Negative for joint pain, low back pain.  Derm: Negative for rash or itching.  Neuro: Negative for weakness, abnormal sensation, seizure, frequent headaches, memory loss, confusion.  Psych: Negative for anxiety, depression, suicidal ideation, hallucinations.  Endo: Negative for unusual weight change.  Heme: Negative for bruising or bleeding. Allergy: Negative for rash or hives.    Physical Examination:  BP  136/78  Pulse 109  Temp(Src) 97.9 F (36.6 C) (Oral)  Ht 5\' 1"  (1.549 m)  Wt 172 lb 3.2 oz (78.109 kg)  BMI 32.55 kg/m2   General: Well-nourished, well-developed in no acute distress.  Head: Normocephalic, atraumatic.   Eyes: Conjunctiva pink, no icterus. Mouth: Oropharyngeal mucosa moist and pink , no lesions erythema or exudate. Neck: Supple without thyromegaly, masses, or lymphadenopathy.  Lungs: Clear to auscultation bilaterally.  Heart: Regular rate and rhythm, no murmurs rubs or gallops.  Abdomen: Bowel sounds are normal, right mid to right lower abdominal pain very mild to deep palpation,nondistended, no hepatosplenomegaly or masses, no abdominal bruits or    hernia , no rebound or guarding.   Rectal: Not performed. Evaluation of external genitalia revealed very small boil on outside of labia, no erythema, scab present. Extremities: No lower extremity edema. No clubbing or deformities.  Neuro: Alert and oriented x 4 , grossly normal neurologically.  Skin: Warm and dry, no rash or jaundice.   Psych: Alert and cooperative, normal mood and affect.  Labs: Hemoglobin A1c 9.5  Imaging Studies: No results found.

## 2013-12-24 NOTE — Assessment & Plan Note (Signed)
80 year old lady with history of chronic right-sided abdominal pain predominantly mid to lower abdomen, change in bowel habits with diarrhea for 6 months. History of tubular adenomas at time of colonoscopy in 2007. Of note, patient has had history of chronic right-sided abdominal pain which we evaluated her extensively in the remote past although we've not seen her in the last 8 years. Would recommend checking stool specimens and labs. Otherwise we'll go ahead and put her on the schedule for colonoscopy primarily for diagnostic purposes but also for surveillance given her history of polyps. She remains to be a fairly active 80 year old with only significant medical history of diabetes. She voiced interest in pursuing colonoscopy today as well.  I have discussed the risks, alternatives, benefits with regards to but not limited to the risk of reaction to medication, bleeding, infection, perforation and the patient is agreeable to proceed. Written consent to be obtained.

## 2013-12-25 ENCOUNTER — Encounter (HOSPITAL_COMMUNITY): Payer: Self-pay | Admitting: Pharmacy Technician

## 2013-12-25 NOTE — Progress Notes (Signed)
Cc to pcp °

## 2013-12-30 LAB — GIARDIA/CRYPTOSPORIDIUM (EIA)
Cryptosporidium Screen (EIA): NEGATIVE
GIARDIA SCREEN (EIA): NEGATIVE

## 2013-12-30 LAB — CLOSTRIDIUM DIFFICILE BY PCR: Toxigenic C. Difficile by PCR: NOT DETECTED

## 2014-01-06 ENCOUNTER — Encounter (HOSPITAL_COMMUNITY)
Admission: RE | Disposition: A | Discharge status: Home / Self Care | Payer: Self-pay | Source: Ambulatory Visit | Attending: Internal Medicine

## 2014-01-06 ENCOUNTER — Ambulatory Visit (HOSPITAL_COMMUNITY)
Admission: RE | Admit: 2014-01-06 | Discharge: 2014-01-06 | Disposition: A | Discharge status: Home / Self Care | Payer: Medicare Other | Source: Ambulatory Visit | Attending: Internal Medicine | Admitting: Internal Medicine

## 2014-01-06 ENCOUNTER — Encounter (HOSPITAL_COMMUNITY): Payer: Self-pay | Admitting: *Deleted

## 2014-01-06 DIAGNOSIS — Z01812 Encounter for preprocedural laboratory examination: Secondary | ICD-10-CM | POA: Insufficient documentation

## 2014-01-06 DIAGNOSIS — R197 Diarrhea, unspecified: Secondary | ICD-10-CM | POA: Insufficient documentation

## 2014-01-06 DIAGNOSIS — K529 Noninfective gastroenteritis and colitis, unspecified: Secondary | ICD-10-CM

## 2014-01-06 DIAGNOSIS — D126 Benign neoplasm of colon, unspecified: Secondary | ICD-10-CM | POA: Insufficient documentation

## 2014-01-06 DIAGNOSIS — R1031 Right lower quadrant pain: Secondary | ICD-10-CM

## 2014-01-06 DIAGNOSIS — R194 Change in bowel habit: Secondary | ICD-10-CM

## 2014-01-06 DIAGNOSIS — E119 Type 2 diabetes mellitus without complications: Secondary | ICD-10-CM | POA: Insufficient documentation

## 2014-01-06 DIAGNOSIS — K5730 Diverticulosis of large intestine without perforation or abscess without bleeding: Secondary | ICD-10-CM | POA: Insufficient documentation

## 2014-01-06 DIAGNOSIS — Z86010 Personal history of colonic polyps: Secondary | ICD-10-CM

## 2014-01-06 HISTORY — PX: COLONOSCOPY: SHX5424

## 2014-01-06 LAB — GLUCOSE, CAPILLARY: Glucose-Capillary: 200 mg/dL — ABNORMAL HIGH (ref 70–99)

## 2014-01-06 SURGERY — COLONOSCOPY
Anesthesia: Moderate Sedation

## 2014-01-06 MED ORDER — MIDAZOLAM HCL 5 MG/5ML IJ SOLN
INTRAMUSCULAR | Status: AC
  Filled 2014-01-06: qty 10

## 2014-01-06 MED ORDER — SIMETHICONE 40 MG/0.6ML PO SUSP
ORAL | Status: DC | PRN
  Administered 2014-01-06: 10:00:00

## 2014-01-06 MED ORDER — ONDANSETRON HCL 4 MG/2ML IJ SOLN
INTRAMUSCULAR | Status: AC
  Filled 2014-01-06: qty 2

## 2014-01-06 MED ORDER — SODIUM CHLORIDE 0.9 % IV SOLN
INTRAVENOUS | Status: DC
  Administered 2014-01-06: 10:00:00 via INTRAVENOUS

## 2014-01-06 MED ORDER — ONDANSETRON HCL 4 MG/2ML IJ SOLN
INTRAMUSCULAR | Status: DC | PRN
  Administered 2014-01-06: 4 mg via INTRAVENOUS

## 2014-01-06 MED ORDER — MIDAZOLAM HCL 5 MG/5ML IJ SOLN
INTRAMUSCULAR | Status: DC | PRN
  Administered 2014-01-06 (×2): 1 mg via INTRAVENOUS
  Administered 2014-01-06 (×2): 2 mg via INTRAVENOUS

## 2014-01-06 MED ORDER — MEPERIDINE HCL 100 MG/ML IJ SOLN
INTRAMUSCULAR | Status: DC | PRN
  Administered 2014-01-06 (×3): 25 mg via INTRAVENOUS

## 2014-01-06 MED ORDER — MEPERIDINE HCL 100 MG/ML IJ SOLN
INTRAMUSCULAR | Status: AC
  Filled 2014-01-06: qty 2

## 2014-01-06 NOTE — Interval H&P Note (Signed)
History and Physical Interval Note:  01/06/2014 10:24 AM  Candice Ray  has presented today for surgery, with the diagnosis of RIGHT SIDED ABDOMINAL PAIN, CHRONIC DIARRHEA AND HISTORY OF COLON POLYPS  The various methods of treatment have been discussed with the patient and family. After consideration of risks, benefits and other options for treatment, the patient has consented to  Procedure(s) with comments: COLONOSCOPY (N/A) - 11:00 as a surgical intervention .  The patient's history has been reviewed, patient examined, no change in status, stable for surgery.  I have reviewed the patient's chart and labs.  Questions were answered to the patient's satisfaction.     No change. Colonoscopy per plan.The risks, benefits, limitations, alternatives and imponderables have been reviewed with the patient. Questions have been answered. All parties are agreeable.   Manus Rudd

## 2014-01-06 NOTE — Discharge Instructions (Addendum)

## 2014-01-06 NOTE — H&P (View-Only) (Signed)
Primary Care Physician:  Lanette Hampshire, MD  Primary Gastroenterologist:  Garfield Cornea, MD   Chief Complaint  Patient presents with  . Diarrhea    HPI:  Candice Ray is a 80 y.o. female here for further evaluation of RLQ pain, diarrhea. Patient last seen by our practice room 2007 when she had her colonoscopy. Previously evaluated on multiple occasions for right lower quadrant pain, constipation. Colonoscopy in 2007 revealed multiple rectal tubular adenomas.   C/o new RLQ pain since summer time. Watery stools for awhile, six months or so. BM 4-5 per day. But if stools just soft, then 2-3 per day. Rare hard stool. No melena, brbpr. At times nauseated but no vomiting. Denies weight loss, heartburn, dysphagia. Complains of a sore in her genitalia region which has had some drainage. Began about 4 days ago. Feels better in the last day or 2 however.   Recent noncontrast CT abdomen and pelvis was unremarkable.  Please note, patient is a very difficult historian in part due to her difficulty hearing  Current Outpatient Prescriptions  Medication Sig Dispense Refill  . Calcium Carbonate-Vitamin D (CALTRATE 600+D PO) Take 1 tablet by mouth 2 (two) times daily.      . metFORMIN (GLUCOPHAGE) 500 MG tablet Take 500 mg by mouth 2 (two) times daily.      . Multiple Vitamin (MULTIVITAMIN WITH MINERALS) TABS Take 1 tablet by mouth daily.      . saxagliptin HCl (ONGLYZA) 5 MG TABS tablet Take 5 mg by mouth daily.      . polyethylene glycol-electrolytes (TRILYTE) 420 G solution Take 4,000 mLs by mouth as directed.  4000 mL  0   No current facility-administered medications for this visit.    Allergies as of 12/24/2013 - Review Complete 12/24/2013  Allergen Reaction Noted  . Contrast media [iodinated diagnostic agents] Swelling 05/23/2012    Past Medical History  Diagnosis Date  . Diabetes mellitus   . Sinus trouble   . HOH (hard of hearing)   . Poor historian     Past Surgical History   Procedure Laterality Date  . Hernia repair      umbilical, [place]  . Polypectomy  05/23/2012    Procedure: POLYPECTOMY;  Surgeon: Florian Buff, MD;  Location: [facility];  Service: Gynecology;  Laterality: N/A;  endometrial polypectomy  . Hysteroscopy w/d&c  05/23/2012    Procedure: DILATATION AND CURETTAGE /HYSTEROSCOPY;  Surgeon: Florian Buff, MD;  Location: [facility];  Service: Gynecology;  Laterality: N/A;  . Colonoscopy  2007    Dr. Gala Romney: tubular adenomas/rectal    Family History  Problem Relation Age of Onset  . Colon cancer Neg Hx     History   Social History  . Marital Status: Married    Spouse Name: N/A    Number of Children: N/A  . Years of Education: N/A   Occupational History  . Not on file.   Social History Main Topics  . Smoking status: Never Smoker   . Smokeless tobacco: Not on file  . Alcohol Use: No  . Drug Use: No  . Sexual Activity:    Other Topics Concern  . Not on file   Social History Narrative  . No narrative on file      ROS:  General: Negative for anorexia, weight loss, fever, chills, fatigue, weakness. Eyes: Negative for vision changes.  ENT: Negative for hoarseness, difficulty swallowing , nasal congestion. CV: Negative for chest pain, angina, palpitations, dyspnea on exertion, peripheral  edema.  Respiratory: Negative for dyspnea at rest, dyspnea on exertion, cough, sputum, wheezing.  GI: See history of present illness. GU:  Negative for dysuria, hematuria, urinary incontinence, urinary frequency, nocturnal urination. See hpi. MS: Negative for joint pain, low back pain.  Derm: Negative for rash or itching.  Neuro: Negative for weakness, abnormal sensation, seizure, frequent headaches, memory loss, confusion.  Psych: Negative for anxiety, depression, suicidal ideation, hallucinations.  Endo: Negative for unusual weight change.  Heme: Negative for bruising or bleeding. Allergy: Negative for rash or hives.    Physical Examination:  BP  136/78  Pulse 109  Temp(Src) 97.9 F (36.6 C) (Oral)  Ht 5\' 1"  (1.549 m)  Wt 172 lb 3.2 oz (78.109 kg)  BMI 32.55 kg/m2   General: Well-nourished, well-developed in no acute distress.  Head: Normocephalic, atraumatic.   Eyes: Conjunctiva pink, no icterus. Mouth: Oropharyngeal mucosa moist and pink , no lesions erythema or exudate. Neck: Supple without thyromegaly, masses, or lymphadenopathy.  Lungs: Clear to auscultation bilaterally.  Heart: Regular rate and rhythm, no murmurs rubs or gallops.  Abdomen: Bowel sounds are normal, right mid to right lower abdominal pain very mild to deep palpation,nondistended, no hepatosplenomegaly or masses, no abdominal bruits or    hernia , no rebound or guarding.   Rectal: Not performed. Evaluation of external genitalia revealed very small boil on outside of labia, no erythema, scab present. Extremities: No lower extremity edema. No clubbing or deformities.  Neuro: Alert and oriented x 4 , grossly normal neurologically.  Skin: Warm and dry, no rash or jaundice.   Psych: Alert and cooperative, normal mood and affect.  Labs: Hemoglobin A1c 9.5  Imaging Studies: No results found.

## 2014-01-06 NOTE — Op Note (Signed)
[facility] [address] [place], [address]   COLONOSCOPY PROCEDURE REPORT  PATIENT: [name], [name]  MR#:         [id number] BIRTHDATE: Nov 17, 1931 , 29  yrs. old GENDER: Female ENDOSCOPIST: R.  Garfield Cornea, MD FACP FACG REFERRED BY:  Marjean Donna, M.D. PROCEDURE DATE:  01/06/2014 PROCEDURE:     Ileocolonoscopy with segmental biopsy, polypectomy and stool sampling  INDICATIONS: Chronic diarrhea  INFORMED CONSENT:  The risks, benefits, alternatives and imponderables including but not limited to bleeding, perforation as well as the possibility of a missed lesion have been reviewed.  The potential for biopsy, lesion removal, etc. have also been discussed.  Questions have been answered.  All parties agreeable. Please see the history and physical in the medical record for more information.  MEDICATIONS: Versed 6 mg IV and Demerol 75 mg IV in divided doses. Zofran 4 mg IV  DESCRIPTION OF PROCEDURE:  After a digital rectal exam was performed, the EC-[id number] ([id number])  colonoscope was advanced from the anus through the rectum and colon to the area of the cecum, ileocecal valve and appendiceal orifice.  The cecum was deeply intubated.  These structures were well-seen and photographed for the record.  From the level of the cecum and ileocecal valve, the scope was slowly and cautiously withdrawn.  The mucosal surfaces were carefully surveyed utilizing scope tip deflection to facilitate fold flattening as needed.  The scope was pulled down into the rectum where a thorough examination including retroflexion was performed.    FINDINGS:  Adequate preparation. Normal rectum. Left-sided diverticula; (1) 5 mm polyp at the hepatic flexure; otherwise, the remaining colonic mucosa appeared normal. The distal 5 cm of terminal ileal mucosa also appeared normal.  THERAPEUTIC / DIAGNOSTIC MANEUVERS PERFORMED:  biopsies of the ascvending and descending segments taken. The  hepatic flexure polyp was cold snare removed  COMPLICATIONS: None  CECAL WITHDRAWAL TIME:  0.5 minutes  IMPRESSION:  Colonic diverticulosis. Colonic polyp-removed as described above. Status post segmental biopsy and stool collection for culture  RECOMMENDATIONS: Follow up on pending studies. If they are negative in terms of elucidating a cause of her diarrhea, will consider a drug holiday off of metformin.   _______________________________ eSigned:  R. Garfield Cornea, MD FACP [facility] 01/06/2014 11:35 AM   CC:    PATIENT NAME:  Candice Ray, Candice Ray MR#: [id number]

## 2014-01-09 ENCOUNTER — Encounter (HOSPITAL_COMMUNITY): Payer: Self-pay | Admitting: Internal Medicine

## 2014-01-10 LAB — STOOL CULTURE

## 2014-01-19 ENCOUNTER — Encounter: Payer: Self-pay | Admitting: Internal Medicine

## 2014-01-21 ENCOUNTER — Telehealth: Payer: Self-pay

## 2014-01-21 NOTE — Telephone Encounter (Signed)
Letter from: Daneil Dolin  Send letter to patient.  Send copy of letter with path to referring provider and PCP.   Needs a followup appt w extender in the coming weeks

## 2014-01-21 NOTE — Telephone Encounter (Signed)
Results Cc to PCP  

## 2014-01-22 NOTE — Telephone Encounter (Signed)
Letter has been mailed to pt.

## 2014-01-23 ENCOUNTER — Encounter: Payer: Self-pay | Admitting: Gastroenterology

## 2014-01-23 NOTE — Telephone Encounter (Signed)
Pt is aware of OV on 4/22 at 11 with [facility] and appt card mailed

## 2014-02-19 ENCOUNTER — Telehealth: Payer: Self-pay | Admitting: Gastroenterology

## 2014-02-19 ENCOUNTER — Ambulatory Visit: Payer: Medicare Other | Admitting: Gastroenterology

## 2014-02-19 NOTE — Telephone Encounter (Signed)
Pt was a no show

## 2014-02-19 NOTE — Telephone Encounter (Signed)
LETTER MAILED

## 2016-02-04 ENCOUNTER — Encounter (INDEPENDENT_AMBULATORY_CARE_PROVIDER_SITE_OTHER): Payer: Medicare Other | Admitting: Ophthalmology

## 2016-02-04 DIAGNOSIS — H43813 Vitreous degeneration, bilateral: Secondary | ICD-10-CM

## 2016-02-04 DIAGNOSIS — E11319 Type 2 diabetes mellitus with unspecified diabetic retinopathy without macular edema: Secondary | ICD-10-CM

## 2016-02-04 DIAGNOSIS — H353131 Nonexudative age-related macular degeneration, bilateral, early dry stage: Secondary | ICD-10-CM | POA: Diagnosis not present

## 2016-02-04 DIAGNOSIS — E113213 Type 2 diabetes mellitus with mild nonproliferative diabetic retinopathy with macular edema, bilateral: Secondary | ICD-10-CM

## 2016-02-22 ENCOUNTER — Other Ambulatory Visit (INDEPENDENT_AMBULATORY_CARE_PROVIDER_SITE_OTHER): Payer: Medicare Other | Admitting: Ophthalmology

## 2016-02-22 DIAGNOSIS — E11311 Type 2 diabetes mellitus with unspecified diabetic retinopathy with macular edema: Secondary | ICD-10-CM

## 2016-02-22 DIAGNOSIS — E113211 Type 2 diabetes mellitus with mild nonproliferative diabetic retinopathy with macular edema, right eye: Secondary | ICD-10-CM | POA: Diagnosis not present

## 2016-03-10 ENCOUNTER — Ambulatory Visit: Payer: Medicare Other | Admitting: Podiatry

## 2016-06-23 ENCOUNTER — Ambulatory Visit (INDEPENDENT_AMBULATORY_CARE_PROVIDER_SITE_OTHER): Payer: Medicare Other | Admitting: Ophthalmology

## 2017-01-11 DIAGNOSIS — L110 Acquired keratosis follicularis: Secondary | ICD-10-CM | POA: Diagnosis not present

## 2017-01-11 DIAGNOSIS — B351 Tinea unguium: Secondary | ICD-10-CM | POA: Diagnosis not present

## 2017-01-11 DIAGNOSIS — E1140 Type 2 diabetes mellitus with diabetic neuropathy, unspecified: Secondary | ICD-10-CM | POA: Diagnosis not present

## 2017-01-18 ENCOUNTER — Ambulatory Visit (INDEPENDENT_AMBULATORY_CARE_PROVIDER_SITE_OTHER): Payer: Medicare Other | Admitting: Ophthalmology

## 2017-01-23 DIAGNOSIS — R413 Other amnesia: Secondary | ICD-10-CM | POA: Diagnosis not present

## 2017-01-23 DIAGNOSIS — Z0000 Encounter for general adult medical examination without abnormal findings: Secondary | ICD-10-CM | POA: Diagnosis not present

## 2017-01-23 DIAGNOSIS — E1140 Type 2 diabetes mellitus with diabetic neuropathy, unspecified: Secondary | ICD-10-CM | POA: Diagnosis not present

## 2017-01-23 DIAGNOSIS — E119 Type 2 diabetes mellitus without complications: Secondary | ICD-10-CM | POA: Diagnosis not present

## 2017-01-24 DIAGNOSIS — Z0001 Encounter for general adult medical examination with abnormal findings: Secondary | ICD-10-CM | POA: Diagnosis not present

## 2017-01-26 ENCOUNTER — Other Ambulatory Visit (HOSPITAL_COMMUNITY): Payer: Self-pay | Admitting: Internal Medicine

## 2017-01-26 DIAGNOSIS — Z1231 Encounter for screening mammogram for malignant neoplasm of breast: Secondary | ICD-10-CM

## 2017-02-06 ENCOUNTER — Ambulatory Visit (HOSPITAL_COMMUNITY)
Admission: RE | Admit: 2017-02-06 | Discharge: 2017-02-06 | Disposition: A | Discharge status: Home / Self Care | Payer: Medicare Other | Source: Ambulatory Visit | Attending: Internal Medicine | Admitting: Internal Medicine

## 2017-02-06 ENCOUNTER — Other Ambulatory Visit (HOSPITAL_COMMUNITY): Payer: Self-pay | Admitting: Internal Medicine

## 2017-02-06 DIAGNOSIS — Z1231 Encounter for screening mammogram for malignant neoplasm of breast: Secondary | ICD-10-CM | POA: Diagnosis not present

## 2017-02-06 DIAGNOSIS — I700 Atherosclerosis of aorta: Secondary | ICD-10-CM | POA: Insufficient documentation

## 2017-02-06 DIAGNOSIS — R634 Abnormal weight loss: Secondary | ICD-10-CM | POA: Diagnosis present

## 2017-02-06 DIAGNOSIS — Q676 Pectus excavatum: Secondary | ICD-10-CM | POA: Diagnosis not present

## 2017-02-10 DIAGNOSIS — E46 Unspecified protein-calorie malnutrition: Secondary | ICD-10-CM | POA: Diagnosis not present

## 2017-02-10 DIAGNOSIS — E1140 Type 2 diabetes mellitus with diabetic neuropathy, unspecified: Secondary | ICD-10-CM | POA: Diagnosis not present

## 2017-02-13 ENCOUNTER — Ambulatory Visit (INDEPENDENT_AMBULATORY_CARE_PROVIDER_SITE_OTHER): Payer: Medicare Other | Admitting: Ophthalmology

## 2017-02-13 DIAGNOSIS — E11319 Type 2 diabetes mellitus with unspecified diabetic retinopathy without macular edema: Secondary | ICD-10-CM

## 2017-02-13 DIAGNOSIS — H43813 Vitreous degeneration, bilateral: Secondary | ICD-10-CM | POA: Diagnosis not present

## 2017-02-13 DIAGNOSIS — E113393 Type 2 diabetes mellitus with moderate nonproliferative diabetic retinopathy without macular edema, bilateral: Secondary | ICD-10-CM | POA: Diagnosis not present

## 2017-02-20 ENCOUNTER — Other Ambulatory Visit (HOSPITAL_COMMUNITY): Payer: Self-pay | Admitting: Internal Medicine

## 2017-02-20 DIAGNOSIS — R928 Other abnormal and inconclusive findings on diagnostic imaging of breast: Secondary | ICD-10-CM

## 2017-02-21 ENCOUNTER — Ambulatory Visit (HOSPITAL_COMMUNITY)
Admission: RE | Admit: 2017-02-21 | Discharge: 2017-02-21 | Disposition: A | Discharge status: Home / Self Care | Payer: Medicare Other | Source: Ambulatory Visit | Attending: Internal Medicine | Admitting: Internal Medicine

## 2017-02-21 DIAGNOSIS — R928 Other abnormal and inconclusive findings on diagnostic imaging of breast: Secondary | ICD-10-CM | POA: Diagnosis not present

## 2017-02-21 DIAGNOSIS — N6321 Unspecified lump in the left breast, upper outer quadrant: Secondary | ICD-10-CM | POA: Diagnosis not present

## 2017-03-29 DIAGNOSIS — B351 Tinea unguium: Secondary | ICD-10-CM | POA: Diagnosis not present

## 2017-03-29 DIAGNOSIS — E1140 Type 2 diabetes mellitus with diabetic neuropathy, unspecified: Secondary | ICD-10-CM | POA: Diagnosis not present

## 2017-03-29 DIAGNOSIS — L110 Acquired keratosis follicularis: Secondary | ICD-10-CM | POA: Diagnosis not present

## 2017-06-07 DIAGNOSIS — E1140 Type 2 diabetes mellitus with diabetic neuropathy, unspecified: Secondary | ICD-10-CM | POA: Diagnosis not present

## 2017-06-07 DIAGNOSIS — L110 Acquired keratosis follicularis: Secondary | ICD-10-CM | POA: Diagnosis not present

## 2017-06-07 DIAGNOSIS — B351 Tinea unguium: Secondary | ICD-10-CM | POA: Diagnosis not present

## 2017-06-14 DIAGNOSIS — H903 Sensorineural hearing loss, bilateral: Secondary | ICD-10-CM | POA: Diagnosis not present

## 2017-06-28 DIAGNOSIS — E1140 Type 2 diabetes mellitus with diabetic neuropathy, unspecified: Secondary | ICD-10-CM | POA: Diagnosis not present

## 2017-06-28 DIAGNOSIS — E119 Type 2 diabetes mellitus without complications: Secondary | ICD-10-CM | POA: Diagnosis not present

## 2017-06-30 ENCOUNTER — Other Ambulatory Visit (HOSPITAL_COMMUNITY): Payer: Self-pay | Admitting: Internal Medicine

## 2017-06-30 DIAGNOSIS — F0390 Unspecified dementia without behavioral disturbance: Secondary | ICD-10-CM

## 2017-06-30 DIAGNOSIS — R634 Abnormal weight loss: Secondary | ICD-10-CM

## 2017-07-10 DIAGNOSIS — I10 Essential (primary) hypertension: Secondary | ICD-10-CM | POA: Diagnosis not present

## 2017-07-10 DIAGNOSIS — E118 Type 2 diabetes mellitus with unspecified complications: Secondary | ICD-10-CM | POA: Diagnosis not present

## 2017-07-14 ENCOUNTER — Ambulatory Visit (HOSPITAL_COMMUNITY): Payer: Medicare Other

## 2017-07-18 ENCOUNTER — Ambulatory Visit (HOSPITAL_COMMUNITY)
Admission: RE | Admit: 2017-07-18 | Discharge: 2017-07-18 | Disposition: A | Discharge status: Home / Self Care | Payer: Medicare Other | Source: Ambulatory Visit | Attending: Internal Medicine | Admitting: Internal Medicine

## 2017-07-18 ENCOUNTER — Ambulatory Visit (HOSPITAL_COMMUNITY): Payer: Medicare Other

## 2017-07-18 ENCOUNTER — Encounter (HOSPITAL_COMMUNITY): Payer: Self-pay

## 2017-07-18 DIAGNOSIS — F0390 Unspecified dementia without behavioral disturbance: Secondary | ICD-10-CM

## 2017-07-18 DIAGNOSIS — R634 Abnormal weight loss: Secondary | ICD-10-CM

## 2017-07-28 ENCOUNTER — Other Ambulatory Visit (HOSPITAL_COMMUNITY): Payer: Self-pay | Admitting: Internal Medicine

## 2017-07-28 ENCOUNTER — Ambulatory Visit (HOSPITAL_COMMUNITY)
Admission: RE | Admit: 2017-07-28 | Discharge: 2017-07-28 | Disposition: A | Discharge status: Home / Self Care | Payer: Medicare Other | Source: Ambulatory Visit | Attending: Internal Medicine | Admitting: Internal Medicine

## 2017-07-28 DIAGNOSIS — R634 Abnormal weight loss: Secondary | ICD-10-CM

## 2017-07-28 DIAGNOSIS — K8020 Calculus of gallbladder without cholecystitis without obstruction: Secondary | ICD-10-CM | POA: Diagnosis not present

## 2017-07-28 DIAGNOSIS — I6782 Cerebral ischemia: Secondary | ICD-10-CM | POA: Insufficient documentation

## 2017-07-28 DIAGNOSIS — I2510 Atherosclerotic heart disease of native coronary artery without angina pectoris: Secondary | ICD-10-CM | POA: Insufficient documentation

## 2017-07-28 DIAGNOSIS — I7389 Other specified peripheral vascular diseases: Secondary | ICD-10-CM | POA: Insufficient documentation

## 2017-07-28 DIAGNOSIS — I700 Atherosclerosis of aorta: Secondary | ICD-10-CM | POA: Insufficient documentation

## 2017-07-28 DIAGNOSIS — I6789 Other cerebrovascular disease: Secondary | ICD-10-CM | POA: Diagnosis not present

## 2017-08-02 DIAGNOSIS — E119 Type 2 diabetes mellitus without complications: Secondary | ICD-10-CM | POA: Diagnosis not present

## 2017-08-28 ENCOUNTER — Ambulatory Visit (INDEPENDENT_AMBULATORY_CARE_PROVIDER_SITE_OTHER): Payer: Medicare Other | Admitting: Ophthalmology

## 2017-08-28 DIAGNOSIS — E11319 Type 2 diabetes mellitus with unspecified diabetic retinopathy without macular edema: Secondary | ICD-10-CM | POA: Diagnosis not present

## 2017-08-28 DIAGNOSIS — E113393 Type 2 diabetes mellitus with moderate nonproliferative diabetic retinopathy without macular edema, bilateral: Secondary | ICD-10-CM

## 2017-08-28 DIAGNOSIS — H43813 Vitreous degeneration, bilateral: Secondary | ICD-10-CM

## 2017-08-31 DIAGNOSIS — B351 Tinea unguium: Secondary | ICD-10-CM | POA: Diagnosis not present

## 2017-08-31 DIAGNOSIS — E1140 Type 2 diabetes mellitus with diabetic neuropathy, unspecified: Secondary | ICD-10-CM | POA: Diagnosis not present

## 2017-08-31 DIAGNOSIS — L110 Acquired keratosis follicularis: Secondary | ICD-10-CM | POA: Diagnosis not present

## 2017-11-02 DIAGNOSIS — E119 Type 2 diabetes mellitus without complications: Secondary | ICD-10-CM | POA: Diagnosis not present

## 2017-11-02 DIAGNOSIS — I10 Essential (primary) hypertension: Secondary | ICD-10-CM | POA: Diagnosis not present

## 2017-11-06 DIAGNOSIS — E1140 Type 2 diabetes mellitus with diabetic neuropathy, unspecified: Secondary | ICD-10-CM | POA: Diagnosis not present

## 2017-11-06 DIAGNOSIS — H903 Sensorineural hearing loss, bilateral: Secondary | ICD-10-CM | POA: Diagnosis not present

## 2017-11-23 DIAGNOSIS — B351 Tinea unguium: Secondary | ICD-10-CM | POA: Diagnosis not present

## 2017-11-23 DIAGNOSIS — L110 Acquired keratosis follicularis: Secondary | ICD-10-CM | POA: Diagnosis not present

## 2017-11-23 DIAGNOSIS — E1140 Type 2 diabetes mellitus with diabetic neuropathy, unspecified: Secondary | ICD-10-CM | POA: Diagnosis not present

## 2018-02-08 DIAGNOSIS — L89893 Pressure ulcer of other site, stage 3: Secondary | ICD-10-CM | POA: Diagnosis not present

## 2018-02-08 DIAGNOSIS — E1140 Type 2 diabetes mellitus with diabetic neuropathy, unspecified: Secondary | ICD-10-CM | POA: Diagnosis not present

## 2018-02-28 ENCOUNTER — Encounter (INDEPENDENT_AMBULATORY_CARE_PROVIDER_SITE_OTHER): Payer: Medicare Other | Admitting: Ophthalmology

## 2018-03-07 DIAGNOSIS — E1140 Type 2 diabetes mellitus with diabetic neuropathy, unspecified: Secondary | ICD-10-CM | POA: Diagnosis not present

## 2018-03-07 DIAGNOSIS — L89892 Pressure ulcer of other site, stage 2: Secondary | ICD-10-CM | POA: Diagnosis not present

## 2018-03-14 DIAGNOSIS — H903 Sensorineural hearing loss, bilateral: Secondary | ICD-10-CM | POA: Diagnosis not present

## 2018-03-14 DIAGNOSIS — E119 Type 2 diabetes mellitus without complications: Secondary | ICD-10-CM | POA: Diagnosis not present

## 2018-03-14 DIAGNOSIS — Z0001 Encounter for general adult medical examination with abnormal findings: Secondary | ICD-10-CM | POA: Diagnosis not present

## 2018-03-14 DIAGNOSIS — Z9119 Patient's noncompliance with other medical treatment and regimen: Secondary | ICD-10-CM | POA: Diagnosis not present

## 2018-03-20 DIAGNOSIS — Z79899 Other long term (current) drug therapy: Secondary | ICD-10-CM | POA: Diagnosis not present

## 2018-03-20 DIAGNOSIS — Z1389 Encounter for screening for other disorder: Secondary | ICD-10-CM | POA: Diagnosis not present

## 2018-03-20 DIAGNOSIS — E118 Type 2 diabetes mellitus with unspecified complications: Secondary | ICD-10-CM | POA: Diagnosis not present

## 2018-03-20 DIAGNOSIS — I10 Essential (primary) hypertension: Secondary | ICD-10-CM | POA: Diagnosis not present

## 2018-04-11 DIAGNOSIS — E118 Type 2 diabetes mellitus with unspecified complications: Secondary | ICD-10-CM | POA: Diagnosis not present

## 2018-04-11 DIAGNOSIS — Z79899 Other long term (current) drug therapy: Secondary | ICD-10-CM | POA: Diagnosis not present

## 2018-04-11 DIAGNOSIS — Z7984 Long term (current) use of oral hypoglycemic drugs: Secondary | ICD-10-CM | POA: Diagnosis not present

## 2018-05-20 IMAGING — US ULTRASOUND LEFT BREAST LIMITED
1 series · 7 of 7 positions shown · non-contrast
Comparison: Screening mammogram dated 02/06/2017.

CLINICAL DATA: Screening recall for possible left breast mass.

EXAM:
2D DIGITAL DIAGNOSTIC UNILATERAL LEFT MAMMOGRAM WITH CAD AND ADJUNCT
TOMO
LEFT BREAST ULTRASOUND

[Series 1: ultrasound left breast limited · 0.07mm/px · 7 of 7 slices shown]
[im 1/7]
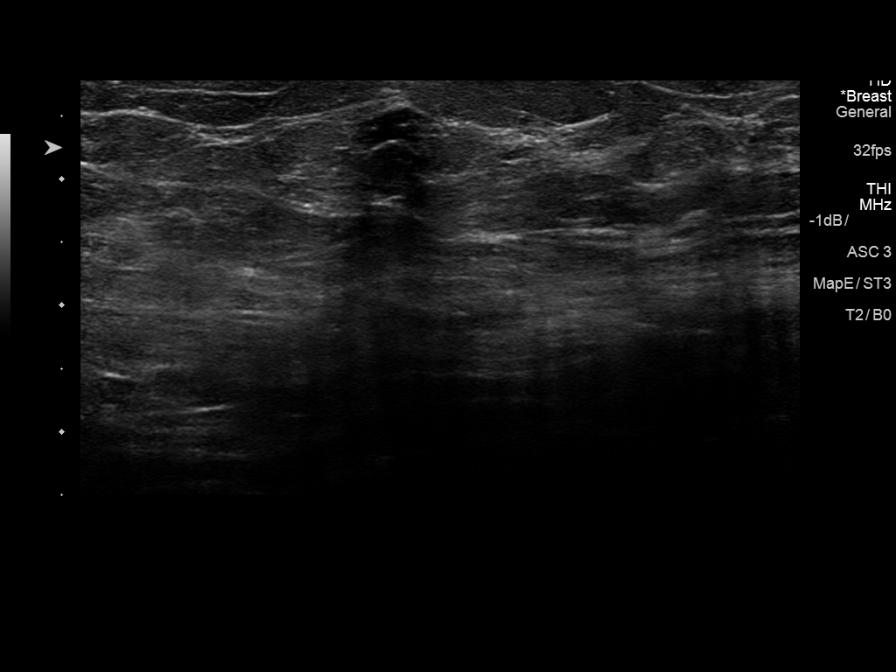
[im 2/7]
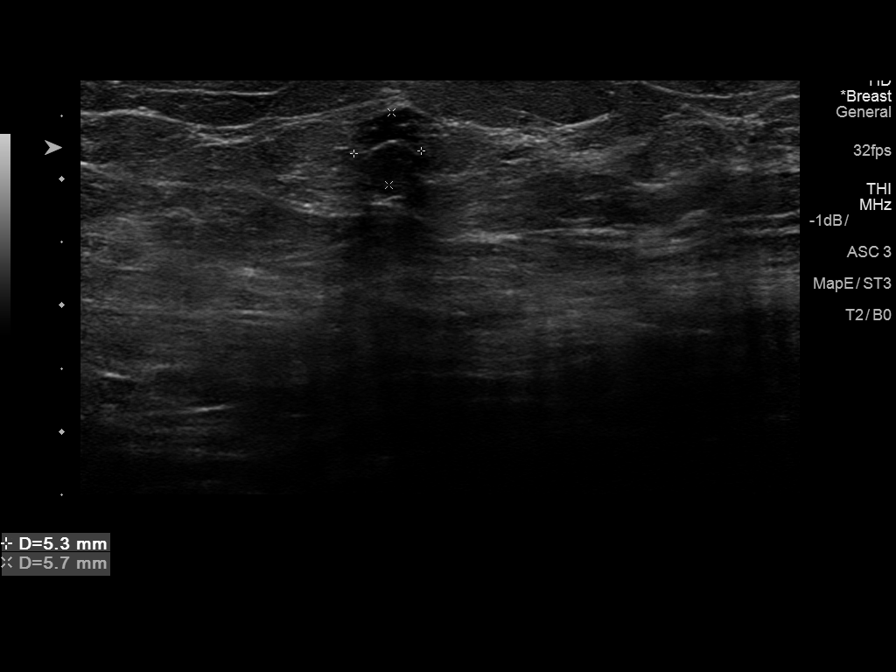
[im 3/7]
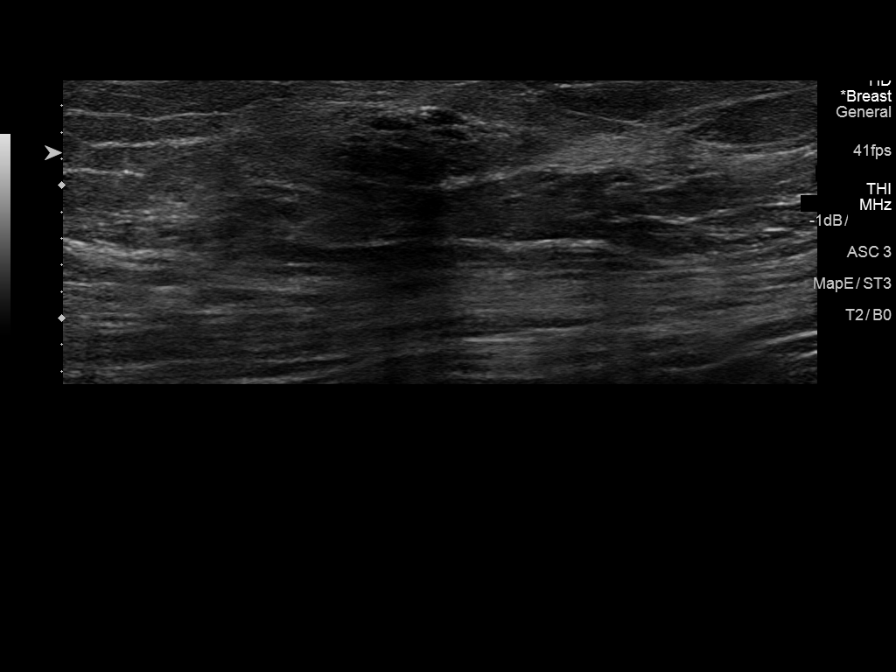
[im 4/7]
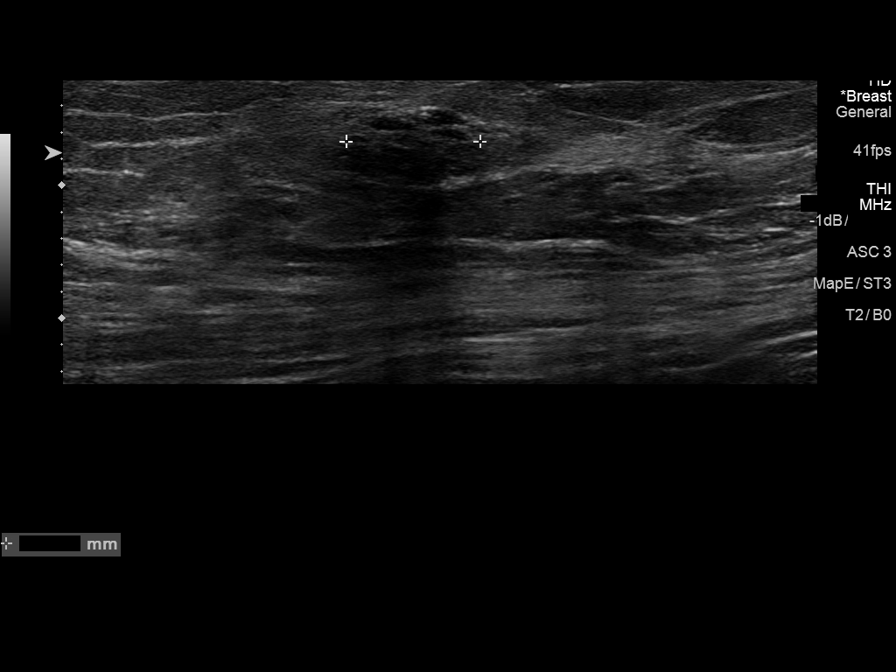
[im 5/7]
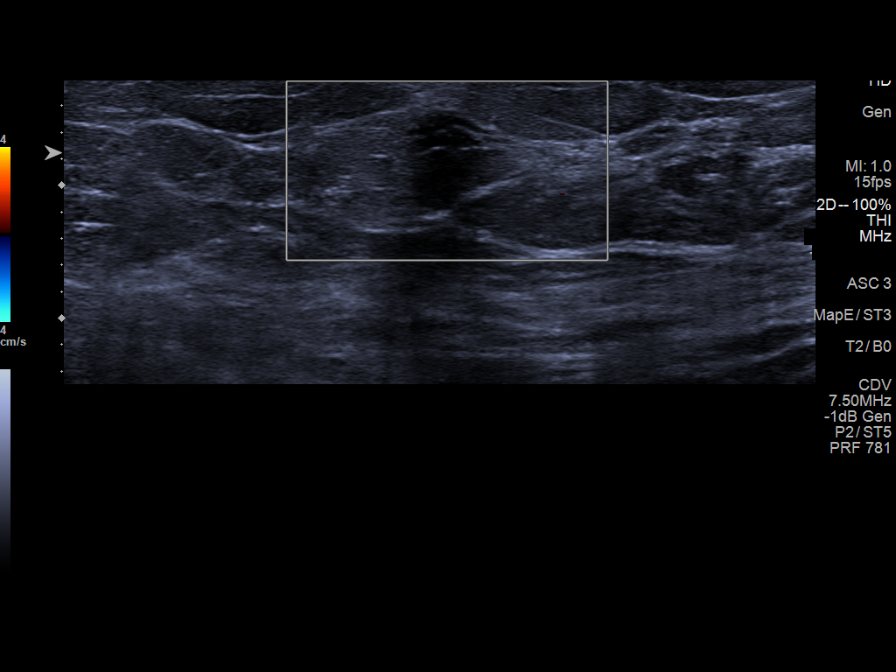
[im 6/7]
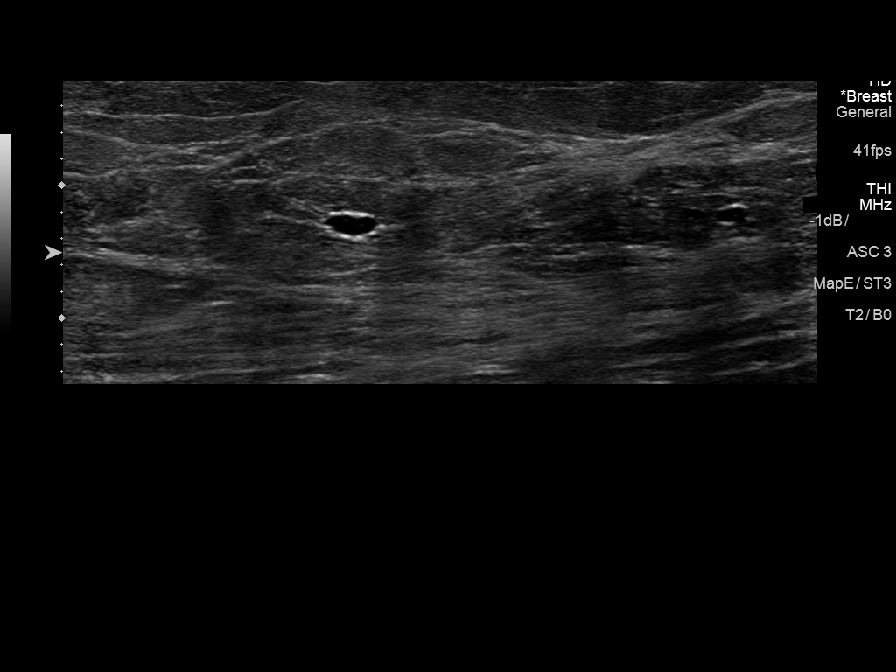
[im 7/7]
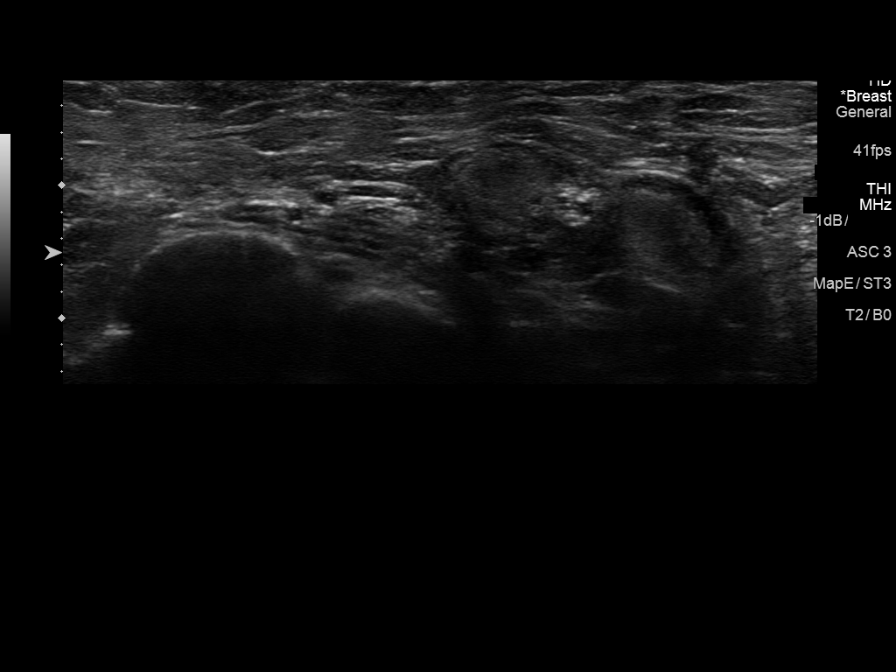

[7 of 7 positions shown; findings below may reference images not displayed]

ACR Breast Density Category b: There are scattered areas of
fibroglandular density.
FINDINGS: Spot compression CC and MLO tomograms were performed over the
upper-outer posterior left breast demonstrating a persistent low
density asymmetry/possible mass measuring approximately 1 cm.

Mammographic images were processed with CAD.

Physical examination of the upper-outer left breast does not reveal
any palpable masses.

Targeted ultrasound of the left breast was performed demonstrating a
cluster of oval near anechoic masses at 1 o'clock 10 cm from nipple
likely representing a cluster of mildly complicated cyst at 1
o'clock 10 cm from nipple measuring 0.5 x 0.6 x 1 cm. This is felt
to correspond with the asymmetry seen at mammography. Multiple
additional smaller cysts are seen throughout the outer left breast.
No morphologically abnormal lymph nodes seen in the left axilla.
IMPRESSION: Probably benign left breast mass.

RECOMMENDATION:
Diagnostic mammography of the left breast with left breast
ultrasound in 6 months.

This was explained to the patient in detail several times, however
it is unclear whether the patient fully understood the findings and
recommendations that were explained to her. The patient refused to
have her family member (daughter-in-law) present while the findings
and recommendations were explained. A documented power of attorney
could not be found in the patient's [REDACTED] chart, however per
the patient's [place], [place], the patient's son is
power of attorney and will return to obtain a copy of the patient's
report.

I have discussed the findings and recommendations with the patient.
Results were also provided in writing at the conclusion of the
visit. If applicable, a reminder letter will be sent to the patient
regarding the next appointment.

BI-RADS CATEGORY  3: Probably benign.

## 2018-05-23 DIAGNOSIS — E1140 Type 2 diabetes mellitus with diabetic neuropathy, unspecified: Secondary | ICD-10-CM | POA: Diagnosis not present

## 2018-05-23 DIAGNOSIS — B351 Tinea unguium: Secondary | ICD-10-CM | POA: Diagnosis not present

## 2018-05-23 DIAGNOSIS — L110 Acquired keratosis follicularis: Secondary | ICD-10-CM | POA: Diagnosis not present

## 2018-06-26 ENCOUNTER — Other Ambulatory Visit: Payer: Self-pay

## 2018-06-26 NOTE — Patient Outreach (Signed)
[facility]) Care Management  06/26/2018  Candice Ray [birth year] [id number]   Medication Adherence call to Candice Ray patient is due on Invokamet 50/1000 mg spoke with patient she said she already pick this medication up from the pharmacy.Candice Ray is showing past due under [place].   [facility] Direct Dial [phone]  Fax [phone] [email or web address]

## 2018-07-12 DIAGNOSIS — E118 Type 2 diabetes mellitus with unspecified complications: Secondary | ICD-10-CM | POA: Diagnosis not present

## 2018-07-12 DIAGNOSIS — Z9181 History of falling: Secondary | ICD-10-CM | POA: Diagnosis not present

## 2018-07-12 DIAGNOSIS — R2681 Unsteadiness on feet: Secondary | ICD-10-CM | POA: Diagnosis not present

## 2018-07-12 DIAGNOSIS — M6281 Muscle weakness (generalized): Secondary | ICD-10-CM | POA: Diagnosis not present

## 2018-07-12 DIAGNOSIS — Z7984 Long term (current) use of oral hypoglycemic drugs: Secondary | ICD-10-CM | POA: Diagnosis not present

## 2018-08-06 DIAGNOSIS — I10 Essential (primary) hypertension: Secondary | ICD-10-CM | POA: Diagnosis not present

## 2018-08-06 DIAGNOSIS — Z79899 Other long term (current) drug therapy: Secondary | ICD-10-CM | POA: Diagnosis not present

## 2018-08-06 DIAGNOSIS — E118 Type 2 diabetes mellitus with unspecified complications: Secondary | ICD-10-CM | POA: Diagnosis not present

## 2018-08-08 DIAGNOSIS — L110 Acquired keratosis follicularis: Secondary | ICD-10-CM | POA: Diagnosis not present

## 2018-08-08 DIAGNOSIS — E1140 Type 2 diabetes mellitus with diabetic neuropathy, unspecified: Secondary | ICD-10-CM | POA: Diagnosis not present

## 2018-08-08 DIAGNOSIS — B351 Tinea unguium: Secondary | ICD-10-CM | POA: Diagnosis not present

## 2018-08-09 DIAGNOSIS — H903 Sensorineural hearing loss, bilateral: Secondary | ICD-10-CM | POA: Diagnosis not present

## 2018-08-09 DIAGNOSIS — Z23 Encounter for immunization: Secondary | ICD-10-CM | POA: Diagnosis not present

## 2018-08-09 DIAGNOSIS — Z7984 Long term (current) use of oral hypoglycemic drugs: Secondary | ICD-10-CM | POA: Diagnosis not present

## 2018-08-09 DIAGNOSIS — E118 Type 2 diabetes mellitus with unspecified complications: Secondary | ICD-10-CM | POA: Diagnosis not present

## 2018-11-07 DIAGNOSIS — B351 Tinea unguium: Secondary | ICD-10-CM | POA: Diagnosis not present

## 2018-11-07 DIAGNOSIS — L110 Acquired keratosis follicularis: Secondary | ICD-10-CM | POA: Diagnosis not present

## 2018-11-07 DIAGNOSIS — E1140 Type 2 diabetes mellitus with diabetic neuropathy, unspecified: Secondary | ICD-10-CM | POA: Diagnosis not present

## 2018-11-08 DIAGNOSIS — R319 Hematuria, unspecified: Secondary | ICD-10-CM | POA: Diagnosis not present

## 2018-11-08 DIAGNOSIS — Z7984 Long term (current) use of oral hypoglycemic drugs: Secondary | ICD-10-CM | POA: Diagnosis not present

## 2018-11-08 DIAGNOSIS — R3912 Poor urinary stream: Secondary | ICD-10-CM | POA: Diagnosis not present

## 2018-11-08 DIAGNOSIS — E118 Type 2 diabetes mellitus with unspecified complications: Secondary | ICD-10-CM | POA: Diagnosis not present

## 2019-02-07 DIAGNOSIS — E1140 Type 2 diabetes mellitus with diabetic neuropathy, unspecified: Secondary | ICD-10-CM | POA: Diagnosis not present

## 2019-02-07 DIAGNOSIS — L110 Acquired keratosis follicularis: Secondary | ICD-10-CM | POA: Diagnosis not present

## 2019-02-07 DIAGNOSIS — B351 Tinea unguium: Secondary | ICD-10-CM | POA: Diagnosis not present

## 2019-03-05 DIAGNOSIS — H903 Sensorineural hearing loss, bilateral: Secondary | ICD-10-CM | POA: Diagnosis not present

## 2019-03-05 DIAGNOSIS — E118 Type 2 diabetes mellitus with unspecified complications: Secondary | ICD-10-CM | POA: Diagnosis not present

## 2019-03-05 DIAGNOSIS — Z7984 Long term (current) use of oral hypoglycemic drugs: Secondary | ICD-10-CM | POA: Diagnosis not present

## 2019-03-05 DIAGNOSIS — Z9119 Patient's noncompliance with other medical treatment and regimen: Secondary | ICD-10-CM | POA: Diagnosis not present

## 2019-04-24 DIAGNOSIS — E1140 Type 2 diabetes mellitus with diabetic neuropathy, unspecified: Secondary | ICD-10-CM | POA: Diagnosis not present

## 2019-04-24 DIAGNOSIS — B351 Tinea unguium: Secondary | ICD-10-CM | POA: Diagnosis not present

## 2019-04-24 DIAGNOSIS — L110 Acquired keratosis follicularis: Secondary | ICD-10-CM | POA: Diagnosis not present

## 2019-06-04 DIAGNOSIS — I10 Essential (primary) hypertension: Secondary | ICD-10-CM | POA: Diagnosis not present

## 2019-06-04 DIAGNOSIS — R319 Hematuria, unspecified: Secondary | ICD-10-CM | POA: Diagnosis not present

## 2019-06-04 DIAGNOSIS — Z79899 Other long term (current) drug therapy: Secondary | ICD-10-CM | POA: Diagnosis not present

## 2019-06-04 DIAGNOSIS — E118 Type 2 diabetes mellitus with unspecified complications: Secondary | ICD-10-CM | POA: Diagnosis not present

## 2019-06-06 DIAGNOSIS — Z9119 Patient's noncompliance with other medical treatment and regimen: Secondary | ICD-10-CM | POA: Diagnosis not present

## 2019-06-06 DIAGNOSIS — Z0001 Encounter for general adult medical examination with abnormal findings: Secondary | ICD-10-CM | POA: Diagnosis not present

## 2019-06-06 DIAGNOSIS — Z7984 Long term (current) use of oral hypoglycemic drugs: Secondary | ICD-10-CM | POA: Diagnosis not present

## 2019-06-06 DIAGNOSIS — H903 Sensorineural hearing loss, bilateral: Secondary | ICD-10-CM | POA: Diagnosis not present

## 2019-06-06 DIAGNOSIS — E1165 Type 2 diabetes mellitus with hyperglycemia: Secondary | ICD-10-CM | POA: Diagnosis not present

## 2019-07-17 DIAGNOSIS — L110 Acquired keratosis follicularis: Secondary | ICD-10-CM | POA: Diagnosis not present

## 2019-07-17 DIAGNOSIS — B351 Tinea unguium: Secondary | ICD-10-CM | POA: Diagnosis not present

## 2019-07-17 DIAGNOSIS — E1140 Type 2 diabetes mellitus with diabetic neuropathy, unspecified: Secondary | ICD-10-CM | POA: Diagnosis not present

## 2019-09-09 DIAGNOSIS — M6281 Muscle weakness (generalized): Secondary | ICD-10-CM | POA: Diagnosis not present

## 2019-09-09 DIAGNOSIS — E119 Type 2 diabetes mellitus without complications: Secondary | ICD-10-CM | POA: Diagnosis not present

## 2019-09-09 DIAGNOSIS — Z7984 Long term (current) use of oral hypoglycemic drugs: Secondary | ICD-10-CM | POA: Diagnosis not present

## 2019-09-09 DIAGNOSIS — Z23 Encounter for immunization: Secondary | ICD-10-CM | POA: Diagnosis not present

## 2020-03-03 ENCOUNTER — Other Ambulatory Visit: Payer: Self-pay

## 2020-03-03 ENCOUNTER — Encounter (HOSPITAL_COMMUNITY): Payer: Self-pay | Admitting: Emergency Medicine

## 2020-03-03 ENCOUNTER — Emergency Department (HOSPITAL_COMMUNITY)
Admission: EM | Admit: 2020-03-03 | Discharge: 2020-03-03 | Disposition: A | Discharge status: Home / Self Care | Payer: Medicare Other | Attending: Emergency Medicine | Admitting: Emergency Medicine

## 2020-03-03 ENCOUNTER — Emergency Department (HOSPITAL_COMMUNITY): Payer: Medicare Other

## 2020-03-03 DIAGNOSIS — Z7982 Long term (current) use of aspirin: Secondary | ICD-10-CM | POA: Insufficient documentation

## 2020-03-03 DIAGNOSIS — E119 Type 2 diabetes mellitus without complications: Secondary | ICD-10-CM | POA: Diagnosis not present

## 2020-03-03 DIAGNOSIS — W01198A Fall on same level from slipping, tripping and stumbling with subsequent striking against other object, initial encounter: Secondary | ICD-10-CM | POA: Diagnosis not present

## 2020-03-03 DIAGNOSIS — Y9289 Other specified places as the place of occurrence of the external cause: Secondary | ICD-10-CM | POA: Diagnosis not present

## 2020-03-03 DIAGNOSIS — S0003XA Contusion of scalp, initial encounter: Secondary | ICD-10-CM | POA: Diagnosis not present

## 2020-03-03 DIAGNOSIS — S199XXA Unspecified injury of neck, initial encounter: Secondary | ICD-10-CM | POA: Diagnosis not present

## 2020-03-03 DIAGNOSIS — S0101XA Laceration without foreign body of scalp, initial encounter: Secondary | ICD-10-CM

## 2020-03-03 DIAGNOSIS — Y999 Unspecified external cause status: Secondary | ICD-10-CM | POA: Diagnosis not present

## 2020-03-03 DIAGNOSIS — Z79899 Other long term (current) drug therapy: Secondary | ICD-10-CM | POA: Insufficient documentation

## 2020-03-03 DIAGNOSIS — S0990XA Unspecified injury of head, initial encounter: Secondary | ICD-10-CM | POA: Diagnosis present

## 2020-03-03 DIAGNOSIS — Y9389 Activity, other specified: Secondary | ICD-10-CM | POA: Diagnosis not present

## 2020-03-03 MED ORDER — LIDOCAINE-EPINEPHRINE (PF) 2 %-1:200000 IJ SOLN
INTRAMUSCULAR | Status: AC
  Administered 2020-03-03: 20 mL
  Filled 2020-03-03: qty 20

## 2020-03-03 MED ORDER — ACETAMINOPHEN 325 MG PO TABS
650.0000 mg | ORAL_TABLET | Freq: Once | ORAL | Status: AC
  Administered 2020-03-03: 17:00:00 650 mg via ORAL
  Filled 2020-03-03: qty 2

## 2020-03-03 NOTE — ED Triage Notes (Signed)
Pt was leaning over to pick something up, falling backwards and hitting the back of her head. Denies being on blood thinners.

## 2020-03-03 NOTE — ED Provider Notes (Signed)
[facility] EMERGENCY DEPARTMENT Provider Note  CSN: [id number] Arrival date & time: 03/03/20 1436    History Chief Complaint  Patient presents with  . Fall    HPI  Candice Ray is a 86 y.o. female who presents after a fall. She reports she was leaning over to pick up a shoe when she lost her balance, fell backwards and hit her head on a TV. She denies LOC. Denies any neck pain or other injuries. She had a laceration that was bandaged prior to my evaluation. Does not take blood thinners, but does take ASA. Daughter-in-law is at bedside.    Past Medical History:  Diagnosis Date  . Diabetes mellitus   . HOH (hard of hearing)   . Poor historian   . Sinus trouble     Past Surgical History:  Procedure Laterality Date  . COLONOSCOPY  2007   Dr. Gala Romney: tubular adenomas/rectal  . COLONOSCOPY N/A 01/06/2014   [id number] diverticulosis. Colonic polyp-removed as described above. Status post segmental biopsy and stool collection for culture  . HERNIA REPAIR     umbilical, [place]  . HYSTEROSCOPY WITH D & C  05/23/2012   Procedure: DILATATION AND CURETTAGE /HYSTEROSCOPY;  Surgeon: Florian Buff, MD;  Location: [facility];  Service: Gynecology;  Laterality: N/A;  . POLYPECTOMY  05/23/2012   Procedure: POLYPECTOMY;  Surgeon: Florian Buff, MD;  Location: [facility];  Service: Gynecology;  Laterality: N/A;  endometrial polypectomy    Family History  Problem Relation Age of Onset  . Colon cancer Neg Hx     Social History   Tobacco Use  . Smoking status: Never Smoker  . Smokeless tobacco: Never Used  Substance Use Topics  . Alcohol use: No  . Drug use: No     Home Medications Prior to Admission medications   Medication Sig Start Date End Date Taking? Authorizing Provider  aspirin EC 81 MG tablet Take 81 mg by mouth daily.   Yes [provider]  Calcium Carbonate-Vitamin D (CALTRATE 600+D PO) Take 1 tablet by mouth 2 (two) times daily.   Yes [provider]  Multiple  Vitamin (MULTIVITAMIN WITH MINERALS) TABS Take 1 tablet by mouth daily.   Yes [provider]  SYNJARDY 12.02-999 MG TABS Take 1 tablet by mouth 2 (two) times daily. 02/17/20  Yes [provider]     Allergies    Contrast media [iodinated diagnostic agents] and Metrizamide   Review of Systems   Review of Systems  Constitutional: Negative for fever.  HENT: Negative for congestion and sore throat.   Respiratory: Negative for cough and shortness of breath.   Cardiovascular: Negative for chest pain.  Gastrointestinal: Negative for abdominal pain, diarrhea, nausea and vomiting.  Genitourinary: Negative for dysuria.  Musculoskeletal: Negative for myalgias.  Skin: Negative for rash.  Neurological: Positive for headaches.  Psychiatric/Behavioral: Negative for behavioral problems.     Physical Exam BP (!) 154/91 (BP Location: Left Arm)   Pulse (!) 106   Temp 97.9 F (36.6 C) (Oral)   Resp (!) 24   Ht 5\' 1"  (1.549 m)   Wt 78 kg   SpO2 97%   BMI 32.50 kg/m   Physical Exam Vitals and nursing note reviewed.  Constitutional:      Appearance: Normal appearance.  HENT:     Head: Normocephalic.     Comments: Laceration of posterior scalp 3cm    Nose: Nose normal.     Mouth/Throat:  Mouth: Mucous membranes are moist.  Eyes:     Extraocular Movements: Extraocular movements intact.     Conjunctiva/sclera: Conjunctivae normal.  Cardiovascular:     Rate and Rhythm: Normal rate.  Pulmonary:     Effort: Pulmonary effort is normal.     Breath sounds: Normal breath sounds.  Chest:     Chest wall: No tenderness.  Abdominal:     General: Abdomen is flat.     Palpations: Abdomen is soft.     Tenderness: There is no abdominal tenderness.  Musculoskeletal:        General: No swelling, tenderness or deformity. Normal range of motion.     Cervical back: Normal range of motion and neck supple. No tenderness.  Skin:    General: Skin is warm and dry.  Neurological:      General: No focal deficit present.     Mental Status: She is alert.  Psychiatric:        Mood and Affect: Mood normal.      ED Results / Procedures / Treatments   Labs (all labs ordered are listed, but only abnormal results are displayed) Labs Reviewed - No data to display  EKG None  Radiology CT Head Wo Contrast  Result Date: 03/03/2020 CLINICAL DATA:  [place] backwards, struck back of head EXAM: CT HEAD WITHOUT CONTRAST TECHNIQUE: Contiguous axial images were obtained from the base of the skull through the vertex without intravenous contrast. COMPARISON:  07/28/2017 FINDINGS: Brain: No acute infarct or hemorrhage. Chronic small vessel ischemic changes within the bilateral basal ganglia and periventricular white matter, stable. Lateral ventricles and remaining midline structures are unremarkable. No acute extra-axial fluid collections. No mass effect. Vascular: No hyperdense vessel or unexpected calcification. Skull: Scalp hematoma along the parietal convexity, with no underlying fracture. No acute bony abnormalities. Sinuses/Orbits: No acute finding. Other: None. IMPRESSION: 1. Stable chronic ischemic changes.  No acute intracranial process. Electronically Signed   By: Randa Ngo M.D.   On: 03/03/2020 16:38   CT Cervical Spine Wo Contrast  Result Date: 03/03/2020 CLINICAL DATA:  [place], hit back of head EXAM: CT CERVICAL SPINE WITHOUT CONTRAST TECHNIQUE: Multidetector CT imaging of the cervical spine was performed without intravenous contrast. Multiplanar CT image reconstructions were also generated. COMPARISON:  None. FINDINGS: Alignment: Mild straightening of cervical spine due to multilevel spondylosis and facet hypertrophy. Otherwise anatomic alignment. Skull base and vertebrae: No acute displaced fracture. Soft tissues and spinal canal: No prevertebral fluid or swelling. No visible canal hematoma. Disc levels: There is multilevel spondylosis greatest at C3-4, C4-5, and C5-6. Symmetrical  neural foraminal narrowing is seen as result of facet hypertrophy and disc osteophyte complex. Upper chest: Airway is patent.  Lung apices are clear. Other: Reconstructed images demonstrate no additional findings. IMPRESSION: 1. Extensive multilevel cervical spondylosis and facet hypertrophy. 2. No acute displaced fracture. Electronically Signed   By: Randa Ngo M.D.   On: 03/03/2020 16:40    Procedures .Marland KitchenLaceration Repair  Date/Time: 03/03/2020 6:11 PM Performed by: Truddie Hidden, MD Authorized by: Truddie Hidden, MD   Consent:    Consent obtained:  Verbal   Consent given by:  Patient Anesthesia (see MAR for exact dosages):    Anesthesia method:  Local infiltration   Local anesthetic:  Lidocaine 1% WITH epi Laceration details:    Location:  Scalp   Scalp location:  Occipital   Length (cm):  3 Repair type:    Repair type:  Intermediate Pre-procedure details:  Preparation:  Patient was prepped and draped in usual sterile fashion Exploration:    Hemostasis achieved with:  Epinephrine and direct pressure Treatment:    Area cleansed with:  Saline   Amount of cleaning:  Standard Skin repair:    Repair method:  Staples   Number of staples:  4 Approximation:    Approximation:  Close Post-procedure details:    Patient tolerance of procedure:  Tolerated well, no immediate complications    Medications Ordered in the ED Medications  acetaminophen (TYLENOL) tablet 650 mg (650 mg Oral Given 03/03/20 1729)  lidocaine-EPINEPHrine (XYLOCAINE W/EPI) 2 %-1:200000 (PF) injection (20 mLs  Given by Other 03/03/20 1819)     ED Course  I have reviewed the triage vital signs and the nursing notes.  Pertinent labs & imaging results that were available during my care of the patient were reviewed by me and considered in my medical decision making (see chart for details).  Clinical Course as of Mar 03 2250  Tue Mar 03, 2020  1525 Patient with mechanical fall and head injury. Wound  is currently bandaged, will send for CT to eval intracranial or c-spine injury and then reassess wound for closure.    [CS]  [id number] CT images and results reviewed, neg for intracranial injury. Dressing was removed and there is a hematoma that is oozing blood, difficult to stop bleeding or identify source due to matted blood in hair. Attempted hemostasis with lidocaine/epi and blind figure of eight sutures and staples without success. RN cleaned matted hair and was able to identify a 3cm laceration, irregular borders. Previous suture and staples removed. This wound was then closed with staples. Still oozing some, but much improved. RN to apply quick clot gauze and pressure dressing then will reassess.     [CS]  1910 Patient's bleeding is controlled. Patient and family at bedside advised to leave dressing in place until tomorrow. Standard wound care and suture removal instructions given.    [CS]    Clinical Course User Index [CS] Truddie Hidden, MD    MDM Rules/Calculators/A&P MDM  Final Clinical Impression(s) / ED Diagnoses Final diagnoses:  Injury of head, initial encounter  Laceration of scalp, initial encounter    Rx / DC Orders ED Discharge Orders    None       Truddie Hidden, MD 03/03/20 2252

## 2020-03-16 DIAGNOSIS — E1165 Type 2 diabetes mellitus with hyperglycemia: Secondary | ICD-10-CM | POA: Diagnosis not present

## 2020-03-16 DIAGNOSIS — I10 Essential (primary) hypertension: Secondary | ICD-10-CM | POA: Diagnosis not present

## 2020-03-18 DIAGNOSIS — R2681 Unsteadiness on feet: Secondary | ICD-10-CM | POA: Diagnosis not present

## 2020-03-18 DIAGNOSIS — R296 Repeated falls: Secondary | ICD-10-CM | POA: Diagnosis not present

## 2020-03-18 DIAGNOSIS — M6281 Muscle weakness (generalized): Secondary | ICD-10-CM | POA: Diagnosis not present

## 2020-03-18 DIAGNOSIS — E1165 Type 2 diabetes mellitus with hyperglycemia: Secondary | ICD-10-CM | POA: Diagnosis not present

## 2020-03-18 DIAGNOSIS — Z7984 Long term (current) use of oral hypoglycemic drugs: Secondary | ICD-10-CM | POA: Diagnosis not present

## 2021-03-17 ENCOUNTER — Encounter (HOSPITAL_COMMUNITY): Payer: Self-pay

## 2021-03-17 ENCOUNTER — Other Ambulatory Visit: Payer: Self-pay

## 2021-03-17 ENCOUNTER — Emergency Department (HOSPITAL_COMMUNITY)
Admission: EM | Admit: 2021-03-17 | Discharge: 2021-03-18 | Disposition: A | Discharge status: Home / Self Care | Payer: Medicare Other | Attending: Emergency Medicine | Admitting: Emergency Medicine

## 2021-03-17 ENCOUNTER — Emergency Department (HOSPITAL_COMMUNITY): Payer: Medicare Other

## 2021-03-17 DIAGNOSIS — E119 Type 2 diabetes mellitus without complications: Secondary | ICD-10-CM | POA: Diagnosis not present

## 2021-03-17 DIAGNOSIS — R1011 Right upper quadrant pain: Secondary | ICD-10-CM | POA: Insufficient documentation

## 2021-03-17 DIAGNOSIS — Z7982 Long term (current) use of aspirin: Secondary | ICD-10-CM | POA: Insufficient documentation

## 2021-03-17 DIAGNOSIS — R109 Unspecified abdominal pain: Secondary | ICD-10-CM | POA: Diagnosis present

## 2021-03-17 LAB — COMPREHENSIVE METABOLIC PANEL
ALT: 19 U/L (ref 0–44)
AST: 24 U/L (ref 15–41)
Albumin: 4.1 g/dL (ref 3.5–5.0)
Alkaline Phosphatase: 45 U/L (ref 38–126)
Anion gap: 9 (ref 5–15)
BUN: 25 mg/dL — ABNORMAL HIGH (ref 8–23)
CO2: 27 mmol/L (ref 22–32)
Calcium: 9 mg/dL (ref 8.9–10.3)
Chloride: 96 mmol/L — ABNORMAL LOW (ref 98–111)
Creatinine, Ser: 0.76 mg/dL (ref 0.44–1.00)
GFR, Estimated: >60 mL/min (ref >60)
Glucose, Bld: 187 mg/dL — ABNORMAL HIGH (ref 70–99)
Potassium: 4.5 mmol/L (ref 3.5–5.1)
Sodium: 132 mmol/L — ABNORMAL LOW (ref 135–145)
Total Bilirubin: 0.7 mg/dL (ref 0.3–1.2)
Total Protein: 6.6 g/dL (ref 6.5–8.1)

## 2021-03-17 LAB — CBC
HCT: 41.9 % (ref 36.0–46.0)
Hemoglobin: 13.6 g/dL (ref 12.0–15.0)
MCH: 30.7 pg (ref 26.0–34.0)
MCHC: 32.5 g/dL (ref 30.0–36.0)
MCV: 94.6 fL (ref 80.0–100.0)
Platelets: 177 10*3/uL (ref 150–400)
RBC: 4.43 MIL/uL (ref 3.87–5.11)
RDW: 13.2 % (ref 11.5–15.5)
WBC: 4.9 10*3/uL (ref 4.0–10.5)
nRBC: 0 % (ref 0.0–0.2)

## 2021-03-17 LAB — LIPASE, BLOOD: Lipase: 43 U/L (ref 11–51)

## 2021-03-17 NOTE — ED Provider Notes (Addendum)
[facility] EMERGENCY DEPARTMENT Provider Note   CSN: [id number] Arrival date & time: 03/17/21  1444 Patient screened by me at triage, at 3:20 PM    History Chief Complaint  Patient presents with  . Abdominal Pain    Candice Ray is a 87 y.o. female.  HPI She is here for evaluation of abdominal pain which began yesterday.  She is not vomiting.  She is not having fever.  She is unable to communicate to give history because of severe hearing loss.  She is here with her daughter who helps to communicate.  Level 5 caveat-difficulty communicating    Past Medical History:  Diagnosis Date  . Diabetes mellitus   . HOH (hard of hearing)   . Poor historian   . Sinus trouble     Patient Active Problem List   Diagnosis Date Noted  . RLQ abdominal pain 12/24/2013  . Chronic diarrhea 12/24/2013  . Bowel habit changes 12/24/2013  . Hx of adenomatous colonic polyps 12/24/2013    Past Surgical History:  Procedure Laterality Date  . COLONOSCOPY  2007   Dr. Gala Romney: tubular adenomas/rectal  . COLONOSCOPY N/A 01/06/2014   [id number] diverticulosis. Colonic polyp-removed as described above. Status post segmental biopsy and stool collection for culture  . HERNIA REPAIR     umbilical, [place]  . HYSTEROSCOPY WITH D & C  05/23/2012   Procedure: DILATATION AND CURETTAGE /HYSTEROSCOPY;  Surgeon: Florian Buff, MD;  Location: [facility];  Service: Gynecology;  Laterality: N/A;  . POLYPECTOMY  05/23/2012   Procedure: POLYPECTOMY;  Surgeon: Florian Buff, MD;  Location: [facility];  Service: Gynecology;  Laterality: N/A;  endometrial polypectomy     OB History   No obstetric history on file.     Family History  Problem Relation Age of Onset  . Colon cancer Neg Hx     Social History   Tobacco Use  . Smoking status: Never Smoker  . Smokeless tobacco: Never Used  Vaping Use  . Vaping Use: Never used  Substance Use Topics  . Alcohol use: No  . Drug use: No    Home Medications Prior to  Admission medications   Medication Sig Start Date End Date Taking? Authorizing Provider  aspirin EC 81 MG tablet Take 81 mg by mouth daily.    [provider]  Calcium Carbonate-Vitamin D (CALTRATE 600+D PO) Take 1 tablet by mouth 2 (two) times daily.    [provider]  Multiple Vitamin (MULTIVITAMIN WITH MINERALS) TABS Take 1 tablet by mouth daily.    [provider]  SYNJARDY 12.02-999 MG TABS Take 1 tablet by mouth 2 (two) times daily. 02/17/20   [provider]    Allergies    Contrast media [iodinated diagnostic agents] and Metrizamide  Review of Systems   Review of Systems  Unable to perform ROS: Mental status change    Physical Exam Updated Vital Signs BP (!) 143/92   Pulse 72   Temp 98.2 F (36.8 C) (Oral)   Resp 18   Ht 5\' 2"  (1.575 m)   Wt 63.5 kg   SpO2 98%   BMI 25.61 kg/m   Physical Exam Vitals and nursing note reviewed.  Constitutional:      General: She is not in acute distress.    Appearance: She is well-developed. She is obese. She is not ill-appearing, toxic-appearing or diaphoretic.  HENT:     Head: Normocephalic and atraumatic.     Right Ear: External ear  normal.     Left Ear: External ear normal.  Eyes:     Conjunctiva/sclera: Conjunctivae normal.     Pupils: Pupils are equal, round, and reactive to light.  Neck:     Trachea: Phonation normal.  Cardiovascular:     Rate and Rhythm: Normal rate and regular rhythm.     Heart sounds: Normal heart sounds.  Pulmonary:     Effort: Pulmonary effort is normal. No respiratory distress.     Breath sounds: Normal breath sounds. No stridor.  Abdominal:     General: There is no distension.     Palpations: Abdomen is soft. There is no mass.     Tenderness: There is abdominal tenderness (Right upper quadrant, mild). There is no guarding.     Hernia: No hernia is present.  Musculoskeletal:        General: Normal range of motion.     Cervical back: Normal range of motion  and neck supple.  Skin:    General: Skin is warm and dry.  Neurological:     Mental Status: She is alert and oriented to person, place, and time.     Cranial Nerves: No cranial nerve deficit.     Sensory: No sensory deficit.     Motor: No abnormal muscle tone.     Coordination: Coordination normal.  Psychiatric:        Mood and Affect: Mood normal.        Behavior: Behavior normal.        Thought Content: Thought content normal.        Judgment: Judgment normal.     ED Results / Procedures / Treatments   Labs (all labs ordered are listed, but only abnormal results are displayed) Labs Reviewed  COMPREHENSIVE METABOLIC PANEL - Abnormal; Notable for the following components:      Result Value   Sodium 132 (*)    Chloride 96 (*)    Glucose, Bld 187 (*)    BUN 25 (*)    All other components within normal limits  LIPASE, BLOOD  CBC  URINALYSIS, ROUTINE W REFLEX MICROSCOPIC    EKG None  Radiology No results found.  Procedures Procedures   Medications Ordered in ED Medications - No data to display  ED Course  I have reviewed the triage vital signs and the nursing notes.  Pertinent labs & imaging results that were available during my care of the patient were reviewed by me and considered in my medical decision making (see chart for details).  Clinical Course as of 03/17/21 2332  Wed Mar 17, 2021  2330 CT Abdomen Pelvis Wo Contrast [EW]    Clinical Course User Index [EW] Daleen Bo, MD   MDM Rules/Calculators/A&P                           Patient Vitals for the past 24 hrs:  BP Temp Temp src Pulse Resp SpO2 Height Weight  03/17/21 2230 (!) 143/92 -- -- 72 18 98 % -- --  03/17/21 2200 129/88 -- -- 88 18 97 % -- --  03/17/21 2128 (!) 149/92 98.2 F (36.8 C) Oral 93 18 98 % -- --  03/17/21 1508 (!) 144/85 99 F (37.2 C) Oral 90 18 96 % -- --  03/17/21 1507 -- -- -- -- -- -- 5\' 2"  (1.575 m) 63.5 kg      Medical Decision Making:  This patient is  presenting for evaluation of  abdominal pain, which does require a range of treatment options, and is a complaint that involves a moderate risk of morbidity and mortality. The differential diagnoses include enteritis, gallbladder disease, intestinal disorder, metabolic disorder. I decided to review old records, and in summary elderly female, presenting with nonspecific symptoms and normal vital signs.  I received additional historical information from daughter at bedside.  Clinical Laboratory Tests Ordered, included CBC, Metabolic panel and Urinalysis. Review indicates normal except glucose high, sodium low, BUN high.  Urinalysis ordered at 1511, not returned as of 11:32 PM Radiologic Tests Ordered, included CT abdomen pel.     Critical Interventions-clinical evaluation, laboratory testing, CT imaging  After These Interventions, the Patient was reevaluated and was found to require additional testing, including CT abdomen pelvis to evaluate for abdominal pain.  Initial screening test are reassuring.  CRITICAL CARE-no   Nursing Notes Reviewed/ Care Coordinated Applicable Imaging Reviewed Interpretation of Laboratory Data incorporated into ED treatment'  11:40 PM-transfer care to oncoming team.  I anticipate she will be able to be discharged following return of urinalysis and CT imaging    Final Clinical Impression(s) / ED Diagnoses Final diagnoses:  Right upper quadrant abdominal pain    Rx / DC Orders ED Discharge Orders    None       Daleen Bo, MD 03/17/21 2778    Daleen Bo, MD 03/17/21 2351

## 2021-03-17 NOTE — ED Triage Notes (Signed)
Pt to er with daughter in law, family states that pt has dementia and is hard of hearing, states that she is here for r sided abd pain, states that the pain started yesterday, states that it is a constant pain.

## 2021-03-18 LAB — URINALYSIS, ROUTINE W REFLEX MICROSCOPIC
Bacteria, UA: NONE SEEN
Bilirubin Urine: NEGATIVE
Glucose, UA: >=500 mg/dL — AB
Hgb urine dipstick: NEGATIVE
Ketones, ur: 5 mg/dL — AB
Leukocytes,Ua: NEGATIVE
Nitrite: NEGATIVE
Protein, ur: NEGATIVE mg/dL
Specific Gravity, Urine: 1.006 (ref 1.005–1.030)
pH: 7 (ref 5.0–8.0)

## 2021-03-18 NOTE — Discharge Instructions (Addendum)
Your CT scan and work-up is reassuring.  Follow-up with your primary physician.

## 2021-03-18 NOTE — ED Provider Notes (Signed)
Patient signed out sending CT scan of the abdomen and pelvis and urinalysis.  Both reviewed and are largely reassuring.  On recheck, patient's daughter states she has not complained of any pain since being in the emergency room.  She took Tylenol at home and has felt better since.  She is nontoxic and her vital signs are reassuring.  Will discharge home and have her follow-up with her primary physician.   Physical Exam  BP 138/87   Pulse 84   Temp 98.2 F (36.8 C) (Oral)   Resp 18   Ht 1.575 m (5\' 2" )   Wt 63.5 kg   SpO2 97%   BMI 25.61 kg/m   Problem List Items Addressed This Visit   None   Visit Diagnoses    Right upper quadrant abdominal pain    -  Primary        Merryl Hacker, MD 03/18/21 [phone]

## 2021-12-30 ENCOUNTER — Emergency Department (HOSPITAL_COMMUNITY): Payer: Medicare Other

## 2021-12-30 ENCOUNTER — Encounter (HOSPITAL_COMMUNITY): Payer: Self-pay | Admitting: *Deleted

## 2021-12-30 ENCOUNTER — Inpatient Hospital Stay (HOSPITAL_COMMUNITY)
Admission: EM | Admit: 2021-12-30 | Discharge: 2022-01-03 | DRG: 536 | Disposition: A | Discharge status: Skilled Nursing Facility | Payer: Medicare Other | Attending: Family Medicine | Admitting: Family Medicine

## 2021-12-30 ENCOUNTER — Other Ambulatory Visit: Payer: Self-pay

## 2021-12-30 DIAGNOSIS — S32512A Fracture of superior rim of left pubis, initial encounter for closed fracture: Principal | ICD-10-CM | POA: Diagnosis present

## 2021-12-30 DIAGNOSIS — E1149 Type 2 diabetes mellitus with other diabetic neurological complication: Secondary | ICD-10-CM

## 2021-12-30 DIAGNOSIS — W1830XA Fall on same level, unspecified, initial encounter: Secondary | ICD-10-CM | POA: Diagnosis present

## 2021-12-30 DIAGNOSIS — Z79899 Other long term (current) drug therapy: Secondary | ICD-10-CM

## 2021-12-30 DIAGNOSIS — W19XXXA Unspecified fall, initial encounter: Secondary | ICD-10-CM | POA: Diagnosis present

## 2021-12-30 DIAGNOSIS — Z7984 Long term (current) use of oral hypoglycemic drugs: Secondary | ICD-10-CM

## 2021-12-30 DIAGNOSIS — S329XXA Fracture of unspecified parts of lumbosacral spine and pelvis, initial encounter for closed fracture: Secondary | ICD-10-CM | POA: Diagnosis present

## 2021-12-30 DIAGNOSIS — F0390 Unspecified dementia without behavioral disturbance: Secondary | ICD-10-CM

## 2021-12-30 DIAGNOSIS — Z20822 Contact with and (suspected) exposure to covid-19: Secondary | ICD-10-CM | POA: Diagnosis present

## 2021-12-30 DIAGNOSIS — M6282 Rhabdomyolysis: Secondary | ICD-10-CM

## 2021-12-30 DIAGNOSIS — Z66 Do not resuscitate: Secondary | ICD-10-CM | POA: Diagnosis present

## 2021-12-30 DIAGNOSIS — Z7985 Long-term (current) use of injectable non-insulin antidiabetic drugs: Secondary | ICD-10-CM

## 2021-12-30 DIAGNOSIS — Z888 Allergy status to other drugs, medicaments and biological substances status: Secondary | ICD-10-CM

## 2021-12-30 DIAGNOSIS — H9190 Unspecified hearing loss, unspecified ear: Secondary | ICD-10-CM | POA: Diagnosis present

## 2021-12-30 DIAGNOSIS — Z7982 Long term (current) use of aspirin: Secondary | ICD-10-CM

## 2021-12-30 DIAGNOSIS — E119 Type 2 diabetes mellitus without complications: Secondary | ICD-10-CM | POA: Diagnosis not present

## 2021-12-30 DIAGNOSIS — R2681 Unsteadiness on feet: Secondary | ICD-10-CM | POA: Diagnosis present

## 2021-12-30 DIAGNOSIS — Z91041 Radiographic dye allergy status: Secondary | ICD-10-CM

## 2021-12-30 DIAGNOSIS — L8990 Pressure ulcer of unspecified site, unspecified stage: Secondary | ICD-10-CM | POA: Insufficient documentation

## 2021-12-30 DIAGNOSIS — L89152 Pressure ulcer of sacral region, stage 2: Secondary | ICD-10-CM | POA: Diagnosis present

## 2021-12-30 HISTORY — DX: Fracture of unspecified parts of lumbosacral spine and pelvis, initial encounter for closed fracture: S32.9XXA

## 2021-12-30 LAB — CBC WITH DIFFERENTIAL/PLATELET
Abs Immature Granulocytes: 0.04 10*3/uL (ref 0.00–0.07)
Basophils Absolute: 0.1 10*3/uL (ref 0.0–0.1)
Basophils Relative: 0 %
Eosinophils Absolute: 0 10*3/uL (ref 0.0–0.5)
Eosinophils Relative: 0 %
HCT: 47.3 % — ABNORMAL HIGH (ref 36.0–46.0)
Hemoglobin: 15.7 g/dL — ABNORMAL HIGH (ref 12.0–15.0)
Immature Granulocytes: 0 %
Lymphocytes Relative: 9 %
Lymphs Abs: 1.1 10*3/uL (ref 0.7–4.0)
MCH: 31.2 pg (ref 26.0–34.0)
MCHC: 33.2 g/dL (ref 30.0–36.0)
MCV: 94 fL (ref 80.0–100.0)
Monocytes Absolute: 1.1 10*3/uL — ABNORMAL HIGH (ref 0.1–1.0)
Monocytes Relative: 9 %
Neutro Abs: 10.2 10*3/uL — ABNORMAL HIGH (ref 1.7–7.7)
Neutrophils Relative %: 82 %
Platelets: 185 10*3/uL (ref 150–400)
RBC: 5.03 MIL/uL (ref 3.87–5.11)
RDW: 13.5 % (ref 11.5–15.5)
WBC: 12.4 10*3/uL — ABNORMAL HIGH (ref 4.0–10.5)
nRBC: 0 % (ref 0.0–0.2)

## 2021-12-30 LAB — TROPONIN I (HIGH SENSITIVITY)
Troponin I (High Sensitivity): 13 ng/L (ref <18)
Troponin I (High Sensitivity): 11 ng/L (ref <18)

## 2021-12-30 LAB — COMPREHENSIVE METABOLIC PANEL
ALT: 43 U/L (ref 0–44)
AST: 122 U/L — ABNORMAL HIGH (ref 15–41)
Albumin: 4.5 g/dL (ref 3.5–5.0)
Alkaline Phosphatase: 52 U/L (ref 38–126)
Anion gap: 18 — ABNORMAL HIGH (ref 5–15)
BUN: 30 mg/dL — ABNORMAL HIGH (ref 8–23)
CO2: 19 mmol/L — ABNORMAL LOW (ref 22–32)
Calcium: 9.3 mg/dL (ref 8.9–10.3)
Chloride: 100 mmol/L (ref 98–111)
Creatinine, Ser: 0.81 mg/dL (ref 0.44–1.00)
GFR, Estimated: >60 mL/min (ref >60)
Glucose, Bld: 181 mg/dL — ABNORMAL HIGH (ref 70–99)
Potassium: 4.1 mmol/L (ref 3.5–5.1)
Sodium: 137 mmol/L (ref 135–145)
Total Bilirubin: 2.5 mg/dL — ABNORMAL HIGH (ref 0.3–1.2)
Total Protein: 7.5 g/dL (ref 6.5–8.1)

## 2021-12-30 LAB — CK: Total CK: 2062 U/L — ABNORMAL HIGH (ref 38–234)

## 2021-12-30 LAB — CBG MONITORING, ED: Glucose-Capillary: 272 mg/dL — ABNORMAL HIGH (ref 70–99)

## 2021-12-30 LAB — RESP PANEL BY RT-PCR (FLU A&B, COVID) ARPGX2
Influenza A by PCR: NEGATIVE
Influenza B by PCR: NEGATIVE
SARS Coronavirus 2 by RT PCR: NEGATIVE

## 2021-12-30 MED ORDER — INSULIN ASPART 100 UNIT/ML IJ SOLN
0.0000 [IU] | Freq: Every day | INTRAMUSCULAR | Status: DC
  Administered 2021-12-30: 3 [IU] via SUBCUTANEOUS
  Administered 2022-01-01 – 2022-01-02 (×2): 2 [IU] via SUBCUTANEOUS

## 2021-12-30 MED ORDER — INSULIN ASPART 100 UNIT/ML IJ SOLN
0.0000 [IU] | Freq: Three times a day (TID) | INTRAMUSCULAR | Status: DC
  Administered 2021-12-31: 3 [IU] via SUBCUTANEOUS
  Administered 2021-12-31: 7 [IU] via SUBCUTANEOUS
  Administered 2021-12-31: 3 [IU] via SUBCUTANEOUS
  Administered 2022-01-01: 2 [IU] via SUBCUTANEOUS
  Administered 2022-01-01 (×2): 5 [IU] via SUBCUTANEOUS
  Administered 2022-01-02: 2 [IU] via SUBCUTANEOUS
  Administered 2022-01-02: 3 [IU] via SUBCUTANEOUS
  Administered 2022-01-02: 5 [IU] via SUBCUTANEOUS
  Administered 2022-01-03 (×2): 3 [IU] via SUBCUTANEOUS

## 2021-12-30 MED ORDER — SODIUM CHLORIDE 0.9 % IV BOLUS
1000.0000 mL | Freq: Once | INTRAVENOUS | Status: AC
  Administered 2021-12-30: 1000 mL via INTRAVENOUS

## 2021-12-30 MED ORDER — SODIUM CHLORIDE 0.9 % IV SOLN
INTRAVENOUS | Status: DC
Start: 2021-12-30 — End: 2022-01-03

## 2021-12-30 NOTE — ED Triage Notes (Signed)
Pt brought in by [facility] from home with c/o fall at home with more confusion than normal. Pt was found laying on the floor when family found her. Family had last seen her on Monday night. Pt told them she fell on Tuesday but also said that she seems much more confused than her normal. Pt c/o pain to left upper leg. Vitals WNL for EMS. ?

## 2021-12-30 NOTE — ED Notes (Signed)
Patient is talking and does not appear to be confused.    She is alert and asking questions.    ?

## 2021-12-30 NOTE — ED Notes (Signed)
Pt had dislodged purewick, pericare performed, linens changed, pt placed in hospital gown. ?

## 2021-12-30 NOTE — H&P (Signed)
[facility] H&P   Patient Demographics:    Candice Ray, is a 88 y.o. female  MRN: [id number]   DOB - 24-May-1932  Admit Date - 12/30/2021  Outpatient Primary MD for the patient is Jani Gravel, MD  Referring MD/NP/PA: Dr Roderic Palau  Patient coming from: Home  Chief Complaint  Patient presents with   Fall      HPI:    Candice Ray  is a 88 y.o. female, with past medical history of diabetes mellitus, dementia, patient lives at home by herself with close family support, last time patient was seen on Monday by family, she is extremely hard of hearing, history was given by the daughter, patient was last seen by Monday, apparently she had a fall by Tuesday, family found her today, she was more confused than her baseline, patient is complaining of left upper leg pain, patient denies any dizziness, lightheadedness or loss of consciousness, per daughter mother refuses to use walker at home despite having 2 walkers. -In ED work-up significant for pelvic fracture, no hip fracture, labs significant for total CK elevated at 2000, but creatinine within normal limit, UA still pending, CT head and cervical spine with no acute findings, and hospitalist consulted to admit.    Review of systems:    In addition to the HPI above,   A full 10 point Review of Systems was done, except as stated above, all other Review of Systems were negative.   With Past History of the following :    Past Medical History:  Diagnosis Date   Diabetes mellitus    HOH (hard of hearing)    Poor historian    Sinus trouble       Past Surgical History:  Procedure Laterality Date   COLONOSCOPY  2007   Dr. Gala Romney: tubular adenomas/rectal   COLONOSCOPY N/A 01/06/2014   HGD:JMEQAST diverticulosis. Colonic polyp-removed as described above. Status post segmental biopsy and stool collection for culture   HERNIA REPAIR     umbilical,  [place]   HYSTEROSCOPY WITH D & C  05/23/2012   Procedure: DILATATION AND CURETTAGE /HYSTEROSCOPY;  Surgeon: Florian Buff, MD;  Location: [facility];  Service: Gynecology;  Laterality: N/A;   POLYPECTOMY  05/23/2012   Procedure: POLYPECTOMY;  Surgeon: Florian Buff, MD;  Location: [facility];  Service: Gynecology;  Laterality: N/A;  endometrial polypectomy      Social History:     Social History   Tobacco Use   Smoking status: Never   Smokeless tobacco: Never  Substance Use Topics   Alcohol use: No       Family History :     Family History  Problem Relation Age of Onset   Colon cancer Neg Hx       Home Medications:   Prior to Admission medications   Medication Sig Start Date End Date Taking? Authorizing Provider  aspirin EC 81  MG tablet Take 81 mg by mouth daily.   Yes [provider]  Calcium Carbonate-Vitamin D (CALTRATE 600+D PO) Take 1 tablet by mouth 2 (two) times daily.   Yes [provider]  Multiple Vitamin (MULTIVITAMIN WITH MINERALS) TABS Take 1 tablet by mouth daily.   Yes [provider]  SYNJARDY 12.02-999 MG TABS Take 1 tablet by mouth 2 (two) times daily. 02/17/20  Yes [provider]  OZEMPIC, 0.25 OR 0.5 MG/DOSE, 2 MG/1.5ML SOPN Inject into the skin. Patient not taking: Reported on 12/30/2021 07/31/21   [provider]     Allergies:     Allergies  Allergen Reactions   Contrast Media [Iodinated Contrast Media] Swelling   Metrizamide Swelling     Physical Exam:   Vitals  Blood pressure 133/89, pulse (!) 116, temperature 98.3 F (36.8 C), temperature source Oral, resp. rate 20, height 5\' 2"  (1.575 m), weight 59 kg, SpO2 98 %.   1. General frail, extremely hard of hearing  2. Normal affect and insight, Not Suicidal or Homicidal, Awake Alert, Oriented X 3.  3. No F.N deficits, ALL C.Nerves Intact, Strength 5/5 all 4 extremities, Sensation intact all 4 extremities, Plantars down going.  4. Ears and Eyes appear  Normal, Conjunctivae clear, PERRLA. Moist Oral Mucosa.  5. Supple Neck, No JVD, No cervical lymphadenopathy appriciated, No Carotid Bruits.  6. Symmetrical Chest wall movement, Good air movement bilaterally, CTAB.  7. RRR, No Gallops, Rubs or Murmurs, No Parasternal Heave.  8. Positive Bowel Sounds, Abdomen Soft, No tenderness, No organomegaly appriciated,No rebound -guarding or rigidity.  9.  No Cyanosis, Normal Skin Turgor, No Skin Rash or Bruise.  10. Good muscle tone,  joints appear normal , no effusions, Normal ROM.  11. No Palpable Lymph Nodes in Neck or Axillae   Data Review:    CBC Recent Labs  Lab 12/30/21 1743  WBC 12.4*  HGB 15.7*  HCT 47.3*  PLT 185  MCV 94.0  MCH 31.2  MCHC 33.2  RDW 13.5  LYMPHSABS 1.1  MONOABS 1.1*  EOSABS 0.0  BASOSABS 0.1   ------------------------------------------------------------------------------------------------------------------  Chemistries  Recent Labs  Lab 12/30/21 1743  NA 137  K 4.1  CL 100  CO2 19*  GLUCOSE 181*  BUN 30*  CREATININE 0.81  CALCIUM 9.3  AST 122*  ALT 43  ALKPHOS 52  BILITOT 2.5*   ------------------------------------------------------------------------------------------------------------------ estimated creatinine clearance is 38 mL/min (by C-G formula based on SCr of 0.81 mg/dL). ------------------------------------------------------------------------------------------------------------------ No results for input(s): TSH, T4TOTAL, T3FREE, THYROIDAB in the last 72 hours.  Invalid input(s): FREET3  Coagulation profile No results for input(s): INR, PROTIME in the last 168 hours. ------------------------------------------------------------------------------------------------------------------- No results for input(s): DDIMER in the last 72 hours. -------------------------------------------------------------------------------------------------------------------  Cardiac Enzymes No results  for input(s): CKMB, TROPONINI, MYOGLOBIN in the last 168 hours.  Invalid input(s): CK ------------------------------------------------------------------------------------------------------------------ No results found for: BNP   ---------------------------------------------------------------------------------------------------------------  Urinalysis    Component Value Date/Time   COLORURINE COLORLESS (A) 03/18/2021 0001   APPEARANCEUR CLEAR 03/18/2021 0001   LABSPEC 1.006 03/18/2021 0001   PHURINE 7.0 03/18/2021 0001   GLUCOSEU >=500 (A) 03/18/2021 0001   HGBUR NEGATIVE 03/18/2021 0001   BILIRUBINUR NEGATIVE 03/18/2021 0001   KETONESUR 5 (A) 03/18/2021 0001   PROTEINUR NEGATIVE 03/18/2021 0001   NITRITE NEGATIVE 03/18/2021 0001   LEUKOCYTESUR NEGATIVE 03/18/2021 0001    ----------------------------------------------------------------------------------------------------------------   Imaging Results:    DG Chest 2 View  Result Date: 12/30/2021 CLINICAL DATA:  Fall EXAM:  CHEST - 2 VIEW COMPARISON:  02/06/2018 FINDINGS: Cardiomegaly. Aortic atherosclerosis. No confluent opacities, effusions or edema. No acute bony abnormality. IMPRESSION: Mild cardiomegaly.  No active disease. Electronically Signed   By: Rolm Baptise M.D.   On: 12/30/2021 17:30   CT Head Wo Contrast  Result Date: 12/30/2021 CLINICAL DATA:  Head trauma, intracranial arterial injury suspected. Fall. EXAM: CT HEAD WITHOUT CONTRAST TECHNIQUE: Contiguous axial images were obtained from the base of the skull through the vertex without intravenous contrast. RADIATION DOSE REDUCTION: This exam was performed according to the departmental dose-optimization program which includes automated exposure control, adjustment of the mA and/or kV according to patient size and/or use of iterative reconstruction technique. COMPARISON:  03/03/2020 FINDINGS: Brain: There is atrophy and chronic small vessel disease changes. No acute  intracranial abnormality. Specifically, no hemorrhage, hydrocephalus, mass lesion, acute infarction, or significant intracranial injury. Vascular: No hyperdense vessel or unexpected calcification. Skull: No acute calvarial abnormality. Sinuses/Orbits: No acute findings Other: None IMPRESSION: Atrophy, chronic microvascular disease. No acute intracranial abnormality. Electronically Signed   By: Rolm Baptise M.D.   On: 12/30/2021 17:17   CT Cervical Spine Wo Contrast  Result Date: 12/30/2021 CLINICAL DATA:  Neck trauma (Age >= 65y).  Fall EXAM: CT CERVICAL SPINE WITHOUT CONTRAST TECHNIQUE: Multidetector CT imaging of the cervical spine was performed without intravenous contrast. Multiplanar CT image reconstructions were also generated. RADIATION DOSE REDUCTION: This exam was performed according to the departmental dose-optimization program which includes automated exposure control, adjustment of the mA and/or kV according to patient size and/or use of iterative reconstruction technique. COMPARISON:  03/03/2020 FINDINGS: Alignment: Normal Skull base and vertebrae: No acute fracture. No primary bone lesion or focal pathologic process. Soft tissues and spinal canal: No prevertebral fluid or swelling. No visible canal hematoma. Disc levels: Moderate diffuse degenerative disc disease and facet disease bilaterally, left greater than right. Upper chest: No acute findings Other: 8 mm low-density nodule in the right thyroid lobe. Not clinically significant; no follow-up imaging recommended (ref: J Am Coll Radiol. 2015 Feb;12(2): 143-50). IMPRESSION: Degenerative changes in the cervical spine. No acute bony abnormality. Electronically Signed   By: Rolm Baptise M.D.   On: 12/30/2021 17:18   DG Hip Unilat W or Wo Pelvis 2-3 Views Left  Result Date: 12/30/2021 CLINICAL DATA:  Fall EXAM: DG HIP (WITH OR WITHOUT PELVIS) 2-3V LEFT COMPARISON:  07/27/2012 FINDINGS: Fractures are noted through the left superior and inferior pubic  rami. Mild displacement of the superior pubic ramus fracture. No fracture in the proximal left femur. No subluxation or dislocation. IMPRESSION: Left superior and inferior pubic rami fractures. Electronically Signed   By: Rolm Baptise M.D.   On: 12/30/2021 17:29       Assessment & Plan:    Principal Problem:   Pelvic fracture (HCC) Active Problems:   Rhabdomyolysis   Diabetes mellitus ([place])   Dementia without behavioral disturbance (HCC)    Pelvic  fracture due to fall -Due to unsteady gait and poor balance, not associated with syncope or any neurological deficits -Most likely conservative management, consult placed for orthopedic regarding weightbearing status recommendation -Continue with as needed pain medications -PT/OT consult to be placed after orthopedic recommendation regarding weightbearing status  Rhabdomyolysis -Total CK elevated at 2000, continue with IV fluids and avoid nephrotoxic medication, monitor total CK closely  Dementia -High risk for delirium, continue with delirium precaution  Diabetes mellitus -Hold Ozempic and will keep on insulin sliding scale during hospital stay  DVT Prophylaxis Heparin -  AM Labs Ordered, also please review Full Orders  Family Communication: Admission, patients condition and plan of care including tests being ordered have been discussed with the patient and daughter at bedside who indicate understanding and agree with the plan and Code Status.  Code Status DNR  Likely DC to  SNF vs Home  Condition GUARDED    Consults called: ortho requested in EPIC    Admission status: observation    Time spent in minutes : 60 minutes   Phillips Climes M.D on 12/30/2021 at 9:22 PM   [facility] - Office  [phone]

## 2021-12-30 NOTE — ED Provider Notes (Signed)
[facility] Provider Note   CSN: [id number] Arrival date & time: 12/30/21  1510     History  Chief Complaint  Patient presents with   Candice Ray is a 88 y.o. female.  Patient fell on Tuesday and was unable to get help.  She complains of pain in her left hip.  Her daughter-in-law came by today and found on the floor.  Patient has a history of diabetes  The history is provided by the patient and medical records.  Fall This is a new problem. The current episode started 12 to 24 hours ago. The problem occurs constantly. The problem has not changed since onset.Pertinent negatives include no chest pain, no abdominal pain and no headaches. Nothing aggravates the symptoms. She has tried nothing for the symptoms.      Home Medications Prior to Admission medications   Medication Sig Start Date End Date Taking? Authorizing Provider  aspirin EC 81 MG tablet Take 81 mg by mouth daily.    [provider]  Calcium Carbonate-Vitamin D (CALTRATE 600+D PO) Take 1 tablet by mouth 2 (two) times daily.    [provider]  Multiple Vitamin (MULTIVITAMIN WITH MINERALS) TABS Take 1 tablet by mouth daily.    [provider]  OZEMPIC, 0.25 OR 0.5 MG/DOSE, 2 MG/1.5ML SOPN Inject into the skin. 07/31/21   [provider]  SYNJARDY 12.02-999 MG TABS Take 1 tablet by mouth 2 (two) times daily. 02/17/20   [provider]      Allergies    Contrast media [iodinated contrast media] and Metrizamide    Review of Systems   Review of Systems  Constitutional:  Negative for appetite change and fatigue.  HENT:  Negative for congestion, ear discharge and sinus pressure.   Eyes:  Negative for discharge.  Respiratory:  Negative for cough.   Cardiovascular:  Negative for chest pain.  Gastrointestinal:  Negative for abdominal pain and diarrhea.  Genitourinary:  Negative for frequency and hematuria.  Musculoskeletal:  Negative for back  pain.       Left hip pain  Skin:  Negative for rash.  Neurological:  Negative for seizures and headaches.  Psychiatric/Behavioral:  Negative for hallucinations.    Physical Exam Updated Vital Signs BP (!) 117/53    Pulse (!) 110    Temp 98.3 F (36.8 C) (Oral)    Resp (!) 23    Ht 5\' 2"  (1.575 m)    Wt 59 kg    SpO2 97%    BMI 23.78 kg/m  Physical Exam Vitals and nursing note reviewed.  Constitutional:      Appearance: She is well-developed.  HENT:     Head: Normocephalic.     Nose: Nose normal.  Eyes:     General: No scleral icterus.    Conjunctiva/sclera: Conjunctivae normal.  Neck:     Thyroid: No thyromegaly.  Cardiovascular:     Rate and Rhythm: Normal rate and regular rhythm.     Heart sounds: No murmur heard.   No friction rub. No gallop.  Pulmonary:     Breath sounds: No stridor. No wheezing or rales.  Chest:     Chest wall: No tenderness.  Abdominal:     General: There is no distension.     Tenderness: There is no abdominal tenderness. There is no rebound.  Musculoskeletal:     Cervical back: Neck supple.     Comments: Tender left hip  Lymphadenopathy:  Cervical: No cervical adenopathy.  Skin:    Findings: No erythema or rash.  Neurological:     Mental Status: She is alert and oriented to person, place, and time.     Motor: No abnormal muscle tone.     Coordination: Coordination normal.  Psychiatric:        Behavior: Behavior normal.    ED Results / Procedures / Treatments   Labs (all labs ordered are listed, but only abnormal results are displayed) Labs Reviewed  CBC WITH DIFFERENTIAL/PLATELET - Abnormal; Notable for the following components:      Result Value   WBC 12.4 (*)    Hemoglobin 15.7 (*)    HCT 47.3 (*)    Neutro Abs 10.2 (*)    Monocytes Absolute 1.1 (*)    All other components within normal limits  COMPREHENSIVE METABOLIC PANEL - Abnormal; Notable for the following components:   CO2 19 (*)    Glucose, Bld 181 (*)    BUN 30 (*)     AST 122 (*)    Total Bilirubin 2.5 (*)    Anion gap 18 (*)    All other components within normal limits  RESP PANEL BY RT-PCR (FLU A&B, COVID) ARPGX2  URINALYSIS, ROUTINE W REFLEX MICROSCOPIC  TROPONIN I (HIGH SENSITIVITY)  TROPONIN I (HIGH SENSITIVITY)    EKG None  Radiology DG Chest 2 View  Result Date: 12/30/2021 CLINICAL DATA:  Fall EXAM: CHEST - 2 VIEW COMPARISON:  02/06/2018 FINDINGS: Cardiomegaly. Aortic atherosclerosis. No confluent opacities, effusions or edema. No acute bony abnormality. IMPRESSION: Mild cardiomegaly.  No active disease. Electronically Signed   By: Rolm Baptise M.D.   On: 12/30/2021 17:30   CT Head Wo Contrast  Result Date: 12/30/2021 CLINICAL DATA:  Head trauma, intracranial arterial injury suspected. Fall. EXAM: CT HEAD WITHOUT CONTRAST TECHNIQUE: Contiguous axial images were obtained from the base of the skull through the vertex without intravenous contrast. RADIATION DOSE REDUCTION: This exam was performed according to the departmental dose-optimization program which includes automated exposure control, adjustment of the mA and/or kV according to patient size and/or use of iterative reconstruction technique. COMPARISON:  03/03/2020 FINDINGS: Brain: There is atrophy and chronic small vessel disease changes. No acute intracranial abnormality. Specifically, no hemorrhage, hydrocephalus, mass lesion, acute infarction, or significant intracranial injury. Vascular: No hyperdense vessel or unexpected calcification. Skull: No acute calvarial abnormality. Sinuses/Orbits: No acute findings Other: None IMPRESSION: Atrophy, chronic microvascular disease. No acute intracranial abnormality. Electronically Signed   By: Rolm Baptise M.D.   On: 12/30/2021 17:17   CT Cervical Spine Wo Contrast  Result Date: 12/30/2021 CLINICAL DATA:  Neck trauma (Age >= 65y).  Fall EXAM: CT CERVICAL SPINE WITHOUT CONTRAST TECHNIQUE: Multidetector CT imaging of the cervical spine was performed  without intravenous contrast. Multiplanar CT image reconstructions were also generated. RADIATION DOSE REDUCTION: This exam was performed according to the departmental dose-optimization program which includes automated exposure control, adjustment of the mA and/or kV according to patient size and/or use of iterative reconstruction technique. COMPARISON:  03/03/2020 FINDINGS: Alignment: Normal Skull base and vertebrae: No acute fracture. No primary bone lesion or focal pathologic process. Soft tissues and spinal canal: No prevertebral fluid or swelling. No visible canal hematoma. Disc levels: Moderate diffuse degenerative disc disease and facet disease bilaterally, left greater than right. Upper chest: No acute findings Other: 8 mm low-density nodule in the right thyroid lobe. Not clinically significant; no follow-up imaging recommended (ref: J Am Coll Radiol. 2015 Feb;12(2): 143-50). IMPRESSION:  Degenerative changes in the cervical spine. No acute bony abnormality. Electronically Signed   By: Rolm Baptise M.D.   On: 12/30/2021 17:18   DG Hip Unilat W or Wo Pelvis 2-3 Views Left  Result Date: 12/30/2021 CLINICAL DATA:  Fall EXAM: DG HIP (WITH OR WITHOUT PELVIS) 2-3V LEFT COMPARISON:  07/27/2012 FINDINGS: Fractures are noted through the left superior and inferior pubic rami. Mild displacement of the superior pubic ramus fracture. No fracture in the proximal left femur. No subluxation or dislocation. IMPRESSION: Left superior and inferior pubic rami fractures. Electronically Signed   By: Rolm Baptise M.D.   On: 12/30/2021 17:29    Procedures Procedures    Medications Ordered in ED Medications  sodium chloride 0.9 % bolus 1,000 mL (0 mLs Intravenous Stopped 12/30/21 1949)    ED Course/ Medical Decision Making/ A&P                           Medical Decision Making Amount and/or Complexity of Data Reviewed Labs: ordered. Radiology: ordered. ECG/medicine tests: ordered.  Risk Decision regarding  hospitalization.  This patient presents to the ED for concern of left hip pain, this involves an extensive number of treatment options, and is a complaint that carries with it a high risk of complications and morbidity.  The differential diagnosis includes acute hip, contusion hip   Co morbidities that complicate the patient evaluation  None   Additional history obtained:  Additional history obtained from daughter External records from outside source obtained and reviewed including hospital   Lab Tests:  I Ordered, and personally interpreted labs.  The pertinent results include: CBC and chemistries which showed elevated glucose of 272 and mild elevated white count   Imaging Studies ordered:  I ordered imaging studies including left hip x-ray I independently visualized and interpreted imaging which showed superior and inferior pubic rami fracture I agree with the radiologist interpretation   Cardiac Monitoring:  The patient was maintained on a cardiac monitor.  I personally viewed and interpreted the cardiac monitored which showed an underlying rhythm of: Normal sinus rhythm   Medicines ordered and prescription drug management:  I ordered medication including normal sinus rhythm for dehydration Reevaluation of the patient after these medicines showed that the patient improved I have reviewed the patients home medicines and have made adjustments as needed   Test Considered:  MRI of hip   Critical Interventions:  None   Consultations Obtained:  I requested consultation with the hospitalist,  and discussed lab and imaging findings as well as pertinent plan - they recommend: Admission and treatment for pelvic fracture   Problem List / ED Course:  Pelvic fracture    Social Determinants of Health:  Lives alone        Patient with inferior and superior left pelvic rami fractures.  She will be admitted to medicine for physical therapy and possible  rehab        Final Clinical Impression(s) / ED Diagnoses Final diagnoses:  Fall  Fall, initial encounter    Rx / DC Orders ED Discharge Orders     None         Milton Ferguson, MD 01/01/22 1000

## 2021-12-31 ENCOUNTER — Other Ambulatory Visit: Payer: Self-pay

## 2021-12-31 DIAGNOSIS — L8990 Pressure ulcer of unspecified site, unspecified stage: Secondary | ICD-10-CM | POA: Insufficient documentation

## 2021-12-31 DIAGNOSIS — E119 Type 2 diabetes mellitus without complications: Secondary | ICD-10-CM | POA: Diagnosis not present

## 2021-12-31 DIAGNOSIS — T796XXD Traumatic ischemia of muscle, subsequent encounter: Secondary | ICD-10-CM

## 2021-12-31 DIAGNOSIS — F0390 Unspecified dementia without behavioral disturbance: Secondary | ICD-10-CM | POA: Diagnosis not present

## 2021-12-31 DIAGNOSIS — S329XXA Fracture of unspecified parts of lumbosacral spine and pelvis, initial encounter for closed fracture: Secondary | ICD-10-CM | POA: Diagnosis not present

## 2021-12-31 LAB — BASIC METABOLIC PANEL
Anion gap: 9 (ref 5–15)
BUN: 28 mg/dL — ABNORMAL HIGH (ref 8–23)
CO2: 21 mmol/L — ABNORMAL LOW (ref 22–32)
Calcium: 8.4 mg/dL — ABNORMAL LOW (ref 8.9–10.3)
Chloride: 105 mmol/L (ref 98–111)
Creatinine, Ser: 0.66 mg/dL (ref 0.44–1.00)
GFR, Estimated: >60 mL/min (ref >60)
Glucose, Bld: 212 mg/dL — ABNORMAL HIGH (ref 70–99)
Potassium: 4 mmol/L (ref 3.5–5.1)
Sodium: 135 mmol/L (ref 135–145)

## 2021-12-31 LAB — URINALYSIS, ROUTINE W REFLEX MICROSCOPIC
Bacteria, UA: NONE SEEN
Bilirubin Urine: NEGATIVE
Glucose, UA: >=500 mg/dL — AB
Ketones, ur: 80 mg/dL — AB
Nitrite: NEGATIVE
Protein, ur: 30 mg/dL — AB
Specific Gravity, Urine: 1.024 (ref 1.005–1.030)
WBC, UA: >50 WBC/hpf — ABNORMAL HIGH (ref 0–5)
pH: 5 (ref 5.0–8.0)

## 2021-12-31 LAB — GLUCOSE, CAPILLARY
Glucose-Capillary: 204 mg/dL — ABNORMAL HIGH (ref 70–99)
Glucose-Capillary: 212 mg/dL — ABNORMAL HIGH (ref 70–99)
Glucose-Capillary: 350 mg/dL — ABNORMAL HIGH (ref 70–99)
Glucose-Capillary: 183 mg/dL — ABNORMAL HIGH (ref 70–99)

## 2021-12-31 LAB — CBC
HCT: 41.1 % (ref 36.0–46.0)
Hemoglobin: 12.9 g/dL (ref 12.0–15.0)
MCH: 29.7 pg (ref 26.0–34.0)
MCHC: 31.4 g/dL (ref 30.0–36.0)
MCV: 94.5 fL (ref 80.0–100.0)
Platelets: 160 10*3/uL (ref 150–400)
RBC: 4.35 MIL/uL (ref 3.87–5.11)
RDW: 13.3 % (ref 11.5–15.5)
WBC: 9.3 10*3/uL (ref 4.0–10.5)
nRBC: 0 % (ref 0.0–0.2)

## 2021-12-31 LAB — CK: Total CK: 964 U/L — ABNORMAL HIGH (ref 38–234)

## 2021-12-31 LAB — HEMOGLOBIN A1C
Hgb A1c MFr Bld: 8.4 % — ABNORMAL HIGH (ref 4.8–5.6)
Mean Plasma Glucose: 194.38 mg/dL

## 2021-12-31 MED ORDER — HYDROCODONE-ACETAMINOPHEN 5-325 MG PO TABS
1.0000 | ORAL_TABLET | ORAL | Status: DC | PRN
  Administered 2021-12-31 – 2022-01-01 (×2): 1 via ORAL
  Administered 2022-01-02: 2 via ORAL
  Filled 2021-12-31: qty 2
  Filled 2021-12-31 (×2): qty 1

## 2021-12-31 MED ORDER — ASPIRIN EC 81 MG PO TBEC
81.0000 mg | DELAYED_RELEASE_TABLET | Freq: Every day | ORAL | Status: DC
  Administered 2021-12-31 – 2022-01-03 (×4): 81 mg via ORAL
  Filled 2021-12-31 (×4): qty 1

## 2021-12-31 MED ORDER — IBUPROFEN 600 MG PO TABS
600.0000 mg | ORAL_TABLET | Freq: Four times a day (QID) | ORAL | Status: DC | PRN
  Administered 2022-01-01: 600 mg via ORAL
  Filled 2021-12-31: qty 1

## 2021-12-31 MED ORDER — HEPARIN SODIUM (PORCINE) 5000 UNIT/ML IJ SOLN
5000.0000 [IU] | Freq: Three times a day (TID) | INTRAMUSCULAR | Status: DC
  Administered 2021-12-31 – 2022-01-03 (×11): 5000 [IU] via SUBCUTANEOUS
  Filled 2021-12-31 (×11): qty 1

## 2021-12-31 MED ORDER — ACETAMINOPHEN 650 MG RE SUPP: 650.0000 mg | Freq: Four times a day (QID) | RECTAL | Status: DC | PRN

## 2021-12-31 MED ORDER — ACETAMINOPHEN 325 MG PO TABS: 650.0000 mg | ORAL_TABLET | Freq: Four times a day (QID) | ORAL | Status: DC | PRN

## 2021-12-31 MED ORDER — TRAZODONE HCL 50 MG PO TABS
50.0000 mg | ORAL_TABLET | Freq: Every evening | ORAL | Status: DC | PRN
  Administered 2021-12-31: 50 mg via ORAL
  Filled 2021-12-31 (×2): qty 1

## 2021-12-31 MED ORDER — METHOCARBAMOL 1000 MG/10ML IJ SOLN
500.0000 mg | Freq: Four times a day (QID) | INTRAVENOUS | Status: DC | PRN
  Filled 2021-12-31: qty 5

## 2021-12-31 NOTE — Progress Notes (Signed)
Inpatient Diabetes Program Recommendations ? ?AACE/ADA: New Consensus Statement on Inpatient Glycemic Control (2015) ? ?Target Ranges:  Prepandial:   less than 140 mg/dL ?     Peak postprandial:   less than 180 mg/dL (1-2 hours) ?     Critically ill patients:  140 - 180 mg/dL  ? ?Lab Results  ?Component Value Date  ? GLUCAP 204 (H) 12/31/2021  ? HGBA1C 8.4 (H) 12/30/2021  ? ? ?Review of Glycemic Control ? Latest Reference Range & Units 12/30/21 22:38 12/31/21 07:38  ?Glucose-Capillary 70 - 99 mg/dL 272 (H) 204 (H)  ? ?Diabetes history: DM 2 ?Outpatient Diabetes medications:  ?Synjardy 12.02-999 mg bid ?Current orders for Inpatient glycemic control:  ?Novolog sensitive tid with meals and HS ?Inpatient Diabetes Program Recommendations:   ?While in the hospital, consider adding Semglee 5 units daily.  ? ?Thanks,  ?Adah Perl, RN, BC-ADM ?Inpatient Diabetes Coordinator ?Pager [phone]  (8a-5p) ? ? ? ?

## 2021-12-31 NOTE — NC FL2 (Signed)
?[place] MEDICAID FL2 LEVEL OF CARE SCREENING TOOL  ?  ? ?IDENTIFICATION  ?Patient Name: ?Candice Ray Birthdate: 06/28/1932 Sex: female Admission Date (Current Location): ?12/30/2021  ?[place] and [place] Number: ? [place] and Address:  ?[facility] [address], [place] ?     Provider Number: ?[id number]  ?Attending Physician Name and Address:  ?Deatra James, MD ? Relative Name and Phone Number:  ?  ?   ?Current Level of Care: ?Hospital Recommended Level of Care: ?[place] Prior Approval Number: ?  ? ?Date Approved/Denied: ?  PASRR Number: ?[id number] A ? ?Discharge Plan: ?SNF ?  ? ?Current Diagnoses: ?Patient Active Problem List  ? Diagnosis Date Noted  ? Pressure injury of skin 12/31/2021  ? Pelvic fracture ([place]) 12/30/2021  ? Dementia without behavioral disturbance ([place]) 12/30/2021  ? Diabetes mellitus ([place]) 12/30/2021  ? Rhabdomyolysis 12/30/2021  ? Bilateral sensorineural hearing loss 06/14/2017  ? RLQ abdominal pain 12/24/2013  ? Chronic diarrhea 12/24/2013  ? Bowel habit changes 12/24/2013  ? Hx of adenomatous colonic polyps 12/24/2013  ? ? ?Orientation RESPIRATION BLADDER Height & Weight   ?  ?Self, Time, Situation, Place ? Normal Continent, External catheter Weight: 130 lb 15.3 oz (59.4 kg) ?Height:  5\' 2"  (157.5 cm)  ?BEHAVIORAL SYMPTOMS/MOOD NEUROLOGICAL BOWEL NUTRITION STATUS  ?    Continent Diet  ?AMBULATORY STATUS COMMUNICATION OF NEEDS Skin   ?Extensive Assist Verbally Normal ?  ?  ?  ?    ?     ?     ? ? ?Personal Care Assistance Level of Assistance  ?Bathing, Feeding, Dressing, Total care Bathing Assistance: Limited assistance ?Feeding assistance: Independent ?Dressing Assistance: Limited assistance ?Total Care Assistance: Limited assistance  ? ?Functional Limitations Info  ?Sight, Hearing, Speech Sight Info: Impaired ?Hearing Info: Impaired ?Speech Info: Adequate  ? ? ?SPECIAL CARE FACTORS FREQUENCY  ?PT (By licensed PT), OT (By  licensed OT)   ?  ?PT Frequency: 5 times weekly ?OT Frequency: 5 times weekly ?  ?  ?  ?   ? ? ?Contractures Contractures Info: Not present  ? ? ?Additional Factors Info  ?Code Status, Allergies Code Status Info: DNR ?Allergies Info: Contrast Media (iodinated Contrast Media) and Metrizamide ?  ?  ?  ?   ? ?Current Medications (12/31/2021):  This is the current hospital active medication list ?Current Facility-Administered Medications  ?Medication Dose Route Frequency Provider Last Rate Last Admin  ? 0.9 %  sodium chloride infusion   Intravenous Continuous Deatra James, MD 50 mL/hr at 12/31/21 0846 Rate Change at 12/31/21 0846  ? acetaminophen (TYLENOL) tablet 650 mg  650 mg Oral Q6H PRN Elgergawy, Silver Huguenin, MD      ? Or  ? acetaminophen (TYLENOL) suppository 650 mg  650 mg Rectal Q6H PRN Elgergawy, Silver Huguenin, MD      ? aspirin EC tablet 81 mg  81 mg Oral Daily Elgergawy, Silver Huguenin, MD   81 mg at 12/31/21 0845  ? heparin injection 5,000 Units  5,000 Units Subcutaneous Q8H Elgergawy, Silver Huguenin, MD   5,000 Units at 12/31/21 0519  ? HYDROcodone-acetaminophen (NORCO/VICODIN) 5-325 MG per tablet 1-2 tablet  1-2 tablet Oral Q4H PRN Elgergawy, Silver Huguenin, MD      ? ibuprofen (ADVIL) tablet 600 mg  600 mg Oral Q6H PRN Shahmehdi, Seyed A, MD      ? insulin aspart (novoLOG) injection 0-5 Units  0-5 Units Subcutaneous QHS Elgergawy, Dawood  S, MD   3 Units at 12/30/21 2239  ? insulin aspart (novoLOG) injection 0-9 Units  0-9 Units Subcutaneous TID WC Elgergawy, Silver Huguenin, MD   3 Units at 12/31/21 1156  ? methocarbamol (ROBAXIN) 500 mg in dextrose 5 % 50 mL IVPB  500 mg Intravenous Q6H PRN Elgergawy, Silver Huguenin, MD      ? traZODone (DESYREL) tablet 50 mg  50 mg Oral QHS PRN Elgergawy, Silver Huguenin, MD      ? ? ? ?Discharge Medications: ?Please see discharge summary for a list of discharge medications. ? ?Relevant Imaging Results: ? ?Relevant Lab Results: ? ? ?Additional Information ?SSN: [id number] ? ?Iona Beard, LCSWA ? ? ? ? ?

## 2021-12-31 NOTE — Plan of Care (Signed)
?  Problem: Acute Rehab PT Goals(only PT should resolve) ?Goal: Pt Will Go Supine/Side To Sit ?Outcome: Progressing ?Flowsheets (Taken 12/31/2021 1449) ?Pt will go Supine/Side to Sit: with minimal assist ?Goal: Pt Will Go Sit To Supine/Side ?Outcome: Progressing ?Flowsheets (Taken 12/31/2021 1449) ?Pt will go Sit to Supine/Side: with minimal assist ?Goal: Patient Will Transfer Sit To/From Stand ?Outcome: Progressing ?Flowsheets (Taken 12/31/2021 1449) ?Patient will transfer sit to/from stand: with minimal assist ?Goal: Pt Will Transfer Bed To Chair/Chair To Bed ?Outcome: Progressing ?Flowsheets (Taken 12/31/2021 1449) ?Pt will Transfer Bed to Chair/Chair to Bed: with min assist ?Goal: Pt/caregiver will Perform Home Exercise Program ?Outcome: Progressing ?Flowsheets (Taken 12/31/2021 1449) ?Pt/caregiver will Perform Home Exercise Program: ? For increased strengthening ? For improved balance ? With Supervision, verbal cues required/provided ? With minimal assist ? 2:50 PM, 12/31/21 ?Mearl Latin PT, DPT ?Physical Therapist at [facility] ?[facility] ? ?

## 2021-12-31 NOTE — TOC Initial Note (Signed)
Transition of Care (TOC) - Initial/Assessment Note  ? ? ?Patient Details  ?Name: Candice Ray ?MRN: [id number] ?Date of Birth: [birth year] ? ?Transition of Care (TOC) CM/SW Contact:    ?Iona Beard, LCSWA ?Phone Number: ?12/31/2021, 4:05 PM ? ?Clinical Narrative:                 ?TOC updated that pt is in need of SNF at D/C. CSW met with pt in room to discuss PT recommending SNF at D/C. Pt is agreeable to a referral being sent to [facility] facilities. CSW complete PASRR and Fl2. Referral sent to local facilities at this time. TOC to follow with bed offers. Insurance Josem Kaufmann has been started.  ? ?Expected Discharge Plan: [place] ?Barriers to Discharge: Continued Medical Work up ? ? ?Patient Goals and CMS Choice ?Patient states their goals for this hospitalization and ongoing recovery are:: Go to SNF ?CMS Medicare.gov Compare Post Acute Care list provided to:: Patient ?Choice offered to / list presented to : Patient ? ?Expected Discharge Plan and Services ?Expected Discharge Plan: [place] ?In-house Referral: Clinical Social Work ?Discharge Planning Services: CM Consult ?Post Acute Care Choice: [place] ?Living arrangements for the past 2 months: [place] ?                ?  ?  ?  ?  ?  ?  ?  ?  ?  ?  ? ?Prior Living Arrangements/Services ?Living arrangements for the past 2 months: [place] ?Lives with:: Self ?Patient language and need for interpreter reviewed:: Yes ?Do you feel safe going back to the place where you live?: Yes      ?Need for Family Participation in Patient Care: Yes (Comment) ?Care giver support system in place?: Yes (comment) ?  ?Criminal Activity/Legal Involvement Pertinent to Current Situation/Hospitalization: No - Comment as needed ? ?Activities of Daily Living ?Home Assistive Devices/Equipment: CBG Meter, Eyeglasses, Walker (specify type) ?ADL Screening (condition at time of admission) ?Patient's cognitive ability adequate to  safely complete daily activities?: Yes ?Is the patient deaf or have difficulty hearing?: Yes ?Does the patient have difficulty seeing, even when wearing glasses/contacts?: No ?Does the patient have difficulty concentrating, remembering, or making decisions?: Yes ?Patient able to express need for assistance with ADLs?: Yes ?Does the patient have difficulty dressing or bathing?: Yes ?Independently performs ADLs?: No ?Communication: Independent ?Dressing (OT): Needs assistance ?Is this a change from baseline?: Change from baseline, expected to last <3days ?Grooming: Needs assistance ?Is this a change from baseline?: Change from baseline, expected to last <3 days ?Feeding: Independent ?Bathing: Needs assistance ?Is this a change from baseline?: Change from baseline, expected to last <3 days ?Toileting: Needs assistance ?Is this a change from baseline?: Change from baseline, expected to last <3 days ?In/Out Bed: Dependent ?Is this a change from baseline?: Change from baseline, expected to last <3 days ?Walks in Home: Independent with device (comment) ?Does the patient have difficulty walking or climbing stairs?: Yes ?Weakness of Legs: Both ?Weakness of Arms/Hands: Both ? ?Permission Sought/Granted ?  ?  ?   ?   ?   ?   ? ?Emotional Assessment ?Appearance:: Appears stated age ?Attitude/Demeanor/Rapport: Engaged ?Affect (typically observed): Accepting ?Orientation: : Oriented to Self, Oriented to Place, Oriented to  Time, Oriented to Situation ?Alcohol / Substance Use: Not Applicable ?Psych Involvement: No (comment) ? ?Admission diagnosis:  Pelvic fracture ([place]) [S32.9XXA] ?Fall [W19.XXXA] ?Fall, initial encounter [W19.XXXA] ?Patient Active Problem List  ?  Diagnosis Date Noted  ? Pressure injury of skin 12/31/2021  ? Pelvic fracture ([place]) 12/30/2021  ? Dementia without behavioral disturbance ([place]) 12/30/2021  ? Diabetes mellitus ([place]) 12/30/2021  ? Rhabdomyolysis 12/30/2021  ? Bilateral sensorineural hearing loss  06/14/2017  ? RLQ abdominal pain 12/24/2013  ? Chronic diarrhea 12/24/2013  ? Bowel habit changes 12/24/2013  ? Hx of adenomatous colonic polyps 12/24/2013  ? ?PCP:  Jani Gravel, MD ?Pharmacy:   ?[facility] [phone] - [place], [place] [address] [place] ?[address] ?[place] [address] ?Phone: [phone] Fax: [phone] ? ? ? ? ?Social Determinants of Health (SDOH) Interventions ?  ? ?Readmission Risk Interventions ?No flowsheet data found. ? ? ?

## 2021-12-31 NOTE — Progress Notes (Addendum)
PROGRESS NOTE    Patient: Candice Ray                            PCP: Jani Gravel, MD                    DOB: 02-25-1932            DOA: 12/30/2021 QPY:[id number]             DOS: 12/31/2021, 11:19 AM   LOS: 0 days   Date of Service: The patient was seen and examined on 12/31/2021  Subjective:   The patient was seen and examined this morning. Hemodynamically stable, Laying comfortably in bed Otherwise no issues overnight .  Brief Narrative:   Candice Ray  is a 88 y.o. female, with past medical history of diabetes mellitus, dementia, patient lives at home by herself with close family support, last time patient was seen on Monday by family, she is extremely hard of hearing, history was given by the daughter, patient was last seen by Monday, apparently she had a fall by Tuesday, family found her today, she was more confused than her baseline, patient is complaining of left upper leg pain, patient denies any dizziness, lightheadedness or loss of consciousness, per daughter mother refuses to use walker at home despite having 2 walkers. -In ED work-up significant for pelvic fracture, no hip fracture, labs significant for total CK elevated at 2000, but creatinine within normal limit, UA still pending, CT head and cervical spine with no acute findings.     Assessment & Plan:   Principal Problem:   Pelvic fracture (HCC) Active Problems:   Rhabdomyolysis   Diabetes mellitus ([place])   Dementia without behavioral disturbance (HCC)   Pressure injury of skin     Assessment and Plan:  Pelvic  fracture due to fall -Continue supportive therapy -Pain management -Appreciate orthopedic input  -Due to unsteady gait and poor balance, not associated with syncope or any neurological deficits -Consulting PT/OT for evaluation recommendation   Rhabdomyolysis -Total CK elevated at 2000 >>  -Monitoring CK closely, creatinine closely -Continue IV fluid resuscitation -Avoiding nephrotoxins     Dementia -High risk for delirium, continue with delirium precaution -Mentation at baseline   Diabetes mellitus -Hold Ozempic  -Checking CBG QA CHS, SSI coverage   Ethics: DNR/DNI   Nutritional status:  The patient's BMI is: Body mass index is 23.95 kg/m. I agree with the assessment and plan as outlined    Skin Assessment: I have examined the patient's skin and I agree with the wound assessment as performed by wound care team As outlined belowe:  Pressure Injury 12/31/21 Sacrum Right;Left Stage 2 -  Partial thickness loss of dermis presenting as a shallow open injury with a red, pink wound bed without slough. (Active)  12/31/21 0139  Location: Sacrum  Location Orientation: Right;Left  Staging: Stage 2 -  Partial thickness loss of dermis presenting as a shallow open injury with a red, pink wound bed without slough.  Wound Description (Comments):   Present on Admission: Yes   --------------------------------------------------------------------------------------------   DVT prophylaxis:  heparin injection 5,000 Units Start: 12/31/21 0600 SCDs Start: 12/31/21 0047   Code Status:   Code Status: DNR  Family Communication:  Findings and plan of care was discussed with the patient daughter at bedside on admission  No family member present at bedside this a.m. -  attempt will be made to update daily  Admission status:   Status is: Observation The patient remains OBS appropriate and will d/c before 2 midnights.    Disposition: from home Discharge--likely home with home health versus SNF in 1-2 days        Procedures:   No admission procedures for hospital encounter.   Antimicrobials:  Anti-infectives (From admission, onward)    None        Medication:   aspirin EC  81 mg Oral Daily   heparin  5,000 Units Subcutaneous Q8H   insulin aspart  0-5 Units Subcutaneous QHS   insulin aspart  0-9 Units Subcutaneous TID WC    acetaminophen **OR**  acetaminophen, HYDROcodone-acetaminophen, ibuprofen, methocarbamol (ROBAXIN) IV, traZODone   Objective:   Vitals:   12/30/21 2300 12/30/21 2330 12/31/21 0119 12/31/21 0530  BP: 93/70 110/64 (!) 114/56 111/63  Pulse: (!) 113 (!) 113 (!) 110 (!) 102  Resp: 18 18 17 17   Temp:   98.3 F (36.8 C) (!) 97.5 F (36.4 C)  TempSrc:   Oral Oral  SpO2: 94% 100% 100% 99%  Weight:   59.4 kg   Height:   5\' 2"  (1.575 m)     Intake/Output Summary (Last 24 hours) at 12/31/2021 1119 Last data filed at 12/31/2021 0900 Gross per 24 hour  Intake 763.33 ml  Output 500 ml  Net 263.33 ml   Filed Weights   12/30/21 1515 12/30/21 1529 12/31/21 0119  Weight: 63.5 kg 59 kg 59.4 kg     Examination:   Physical Exam  Constitution:  Alert, cooperative, no distress,  Appears calm and comfortable  Psychiatric:   Normal and stable mood and affect, cognition intact,   HEENT:        Normocephalic, PERRL, otherwise with in Normal limits  Chest:         Chest symmetric Cardio vascular:  S1/S2, RRR, No murmure, No Rubs or Gallops  pulmonary: Clear to auscultation bilaterally, respirations unlabored, negative wheezes / crackles Abdomen: Soft, non-tender, non-distended, bowel sounds,no masses, no organomegaly Muscular skeletal: Limited exam - in bed, able to move all 4 extremities,   Neuro: CNII-XII intact. , normal motor and sensation, reflexes intact  Extremities: No pitting edema lower extremities, +2 pulses  Skin: Dry, warm to touch, negative for any Rashes, No open wounds Wounds: per nursing documentation   ------------------------------------------------------------------------------------------------------------------------------------------    LABs:  CBC Latest Ref Rng & Units 12/31/2021 12/30/2021 03/17/2021  WBC 4.0 - 10.5 K/uL 9.3 12.4(H) 4.9  Hemoglobin 12.0 - 15.0 g/dL 12.9 15.7(H) 13.6  Hematocrit 36.0 - 46.0 % 41.1 47.3(H) 41.9  Platelets 150 - 400 K/uL 160 185 177   CMP Latest Ref Rng & Units  12/31/2021 12/30/2021 03/17/2021  Glucose 70 - 99 mg/dL 212(H) 181(H) 187(H)  BUN 8 - 23 mg/dL 28(H) 30(H) 25(H)  Creatinine 0.44 - 1.00 mg/dL 0.66 0.81 0.76  Sodium 135 - 145 mmol/L 135 137 132(L)  Potassium 3.5 - 5.1 mmol/L 4.0 4.1 4.5  Chloride 98 - 111 mmol/L 105 100 96(L)  CO2 22 - 32 mmol/L 21(L) 19(L) 27  Calcium 8.9 - 10.3 mg/dL 8.4(L) 9.3 9.0  Total Protein 6.5 - 8.1 g/dL - 7.5 6.6  Total Bilirubin 0.3 - 1.2 mg/dL - 2.5(H) 0.7  Alkaline Phos 38 - 126 U/L - 52 45  AST 15 - 41 U/L - 122(H) 24  ALT 0 - 44 U/L - 43 19       Micro Results Recent Results (from the past 240 hour(s))  Resp Panel  by RT-PCR (Flu A&B, Covid) Nasopharyngeal Swab     Status: None   Collection Time: 12/30/21  5:54 PM   Specimen: Nasopharyngeal Swab; Nasopharyngeal(NP) swabs in vial transport medium  Result Value Ref Range Status   SARS Coronavirus 2 by RT PCR NEGATIVE NEGATIVE Final    Comment: (NOTE) SARS-CoV-2 target nucleic acids are NOT DETECTED.  The SARS-CoV-2 RNA is generally detectable in upper respiratory specimens during the acute phase of infection. The lowest concentration of SARS-CoV-2 viral copies this assay can detect is 138 copies/mL. A negative result does not preclude SARS-Cov-2 infection and should not be used as the sole basis for treatment or other patient management decisions. A negative result may occur with  improper specimen collection/handling, submission of specimen other than nasopharyngeal swab, presence of viral mutation(s) within the areas targeted by this assay, and inadequate number of viral copies(<138 copies/mL). A negative result must be combined with clinical observations, patient history, and epidemiological information. The expected result is Negative.  Fact Sheet for Patients:  EntrepreneurPulse.com.au  Fact Sheet for Healthcare Providers:  IncredibleEmployment.be  This test is no t yet approved or cleared by the [place] FDA and  has been authorized for detection and/or diagnosis of SARS-CoV-2 by FDA under an Emergency Use Authorization (EUA). This EUA will remain  in effect (meaning this test can be used) for the duration of the COVID-19 declaration under Section 564(b)(1) of the Act, 21 U.S.C.section 360bbb-3(b)(1), unless the authorization is terminated  or revoked sooner.       Influenza A by PCR NEGATIVE NEGATIVE Final   Influenza B by PCR NEGATIVE NEGATIVE Final    Comment: (NOTE) The Xpert Xpress SARS-CoV-2/FLU/RSV plus assay is intended as an aid in the diagnosis of influenza from Nasopharyngeal swab specimens and should not be used as a sole basis for treatment. Nasal washings and aspirates are unacceptable for Xpert Xpress SARS-CoV-2/FLU/RSV testing.  Fact Sheet for Patients: EntrepreneurPulse.com.au  Fact Sheet for Healthcare Providers: IncredibleEmployment.be  This test is not yet approved or cleared by the [place] FDA and has been authorized for detection and/or diagnosis of SARS-CoV-2 by FDA under an Emergency Use Authorization (EUA). This EUA will remain in effect (meaning this test can be used) for the duration of the COVID-19 declaration under Section 564(b)(1) of the Act, 21 U.S.C. section 360bbb-3(b)(1), unless the authorization is terminated or revoked.  Performed at [facility], [address] [place], [place] [address]     Radiology Reports DG Chest 2 View  Result Date: 12/30/2021 CLINICAL DATA:  Fall EXAM: CHEST - 2 VIEW COMPARISON:  02/06/2018 FINDINGS: Cardiomegaly. Aortic atherosclerosis. No confluent opacities, effusions or edema. No acute bony abnormality. IMPRESSION: Mild cardiomegaly.  No active disease. Electronically Signed   By: Rolm Baptise M.D.   On: 12/30/2021 17:30   CT Head Wo Contrast  Result Date: 12/30/2021 CLINICAL DATA:  Head trauma, intracranial arterial injury suspected. Fall. EXAM: CT HEAD WITHOUT  CONTRAST TECHNIQUE: Contiguous axial images were obtained from the base of the skull through the vertex without intravenous contrast. RADIATION DOSE REDUCTION: This exam was performed according to the departmental dose-optimization program which includes automated exposure control, adjustment of the mA and/or kV according to patient size and/or use of iterative reconstruction technique. COMPARISON:  03/03/2020 FINDINGS: Brain: There is atrophy and chronic small vessel disease changes. No acute intracranial abnormality. Specifically, no hemorrhage, hydrocephalus, mass lesion, acute infarction, or significant intracranial injury. Vascular: No hyperdense vessel or unexpected calcification. Skull: No acute calvarial  abnormality. Sinuses/Orbits: No acute findings Other: None IMPRESSION: Atrophy, chronic microvascular disease. No acute intracranial abnormality. Electronically Signed   By: Rolm Baptise M.D.   On: 12/30/2021 17:17   CT Cervical Spine Wo Contrast  Result Date: 12/30/2021 CLINICAL DATA:  Neck trauma (Age >= 65y).  Fall EXAM: CT CERVICAL SPINE WITHOUT CONTRAST TECHNIQUE: Multidetector CT imaging of the cervical spine was performed without intravenous contrast. Multiplanar CT image reconstructions were also generated. RADIATION DOSE REDUCTION: This exam was performed according to the departmental dose-optimization program which includes automated exposure control, adjustment of the mA and/or kV according to patient size and/or use of iterative reconstruction technique. COMPARISON:  03/03/2020 FINDINGS: Alignment: Normal Skull base and vertebrae: No acute fracture. No primary bone lesion or focal pathologic process. Soft tissues and spinal canal: No prevertebral fluid or swelling. No visible canal hematoma. Disc levels: Moderate diffuse degenerative disc disease and facet disease bilaterally, left greater than right. Upper chest: No acute findings Other: 8 mm low-density nodule in the right thyroid lobe. Not  clinically significant; no follow-up imaging recommended (ref: J Am Coll Radiol. 2015 Feb;12(2): 143-50). IMPRESSION: Degenerative changes in the cervical spine. No acute bony abnormality. Electronically Signed   By: Rolm Baptise M.D.   On: 12/30/2021 17:18   DG Hip Unilat W or Wo Pelvis 2-3 Views Left  Result Date: 12/30/2021 CLINICAL DATA:  Fall EXAM: DG HIP (WITH OR WITHOUT PELVIS) 2-3V LEFT COMPARISON:  07/27/2012 FINDINGS: Fractures are noted through the left superior and inferior pubic rami. Mild displacement of the superior pubic ramus fracture. No fracture in the proximal left femur. No subluxation or dislocation. IMPRESSION: Left superior and inferior pubic rami fractures. Electronically Signed   By: Rolm Baptise M.D.   On: 12/30/2021 17:29    SIGNED: Deatra James, MD, FHM. [facility],  Pager (please use [email or web address] to page/text) Please use Epic Secure Chat for non-urgent communication (7AM-7PM)  If 7PM-7AM, please contact night-coverage [email or web address], 12/31/2021, 11:19 AM

## 2021-12-31 NOTE — Evaluation (Signed)
Physical Therapy Evaluation ?Patient Details ?Name: Candice Ray ?MRN: [id number] ?DOB: December 06, 1931 ?Today's Date: 12/31/2021 ? ?History of Present Illness ? Candice Ray  is a 88 y.o. female, with past medical history of diabetes mellitus, dementia, patient lives at home by herself with close family support, last time patient was seen on Monday by family, she is extremely hard of hearing, history was given by the daughter, patient was last seen by Monday, apparently she had a fall by Tuesday, family found her today, she was more confused than her baseline, patient is complaining of left upper leg pain, patient denies any dizziness, lightheadedness or loss of consciousness, per daughter mother refuses to use walker at home despite having 2 walkers. ?  ?Clinical Impression ? Patient limited for functional mobility as stated below secondary to BLE weakness, fatigue, pain and poor standing balance. Patient highly motivated for mobility todaybut requires assist for LE movement and to pull to seated EOB. She demonstrates good sitting tolerance and sitting balance EOB. She requires assist and RW to transfer to standing but demonstrates very limited standing tolerance and balance due to L hip pain. She transfers/attempts to transfer to standing several times but is limited by pain and is assisted back to supine at end of session. Patient will benefit from continued physical therapy in hospital and recommended venue below to increase strength, balance, endurance for safe ADLs and gait. ?   ?   ? ?Recommendations for follow up therapy are one component of a multi-disciplinary discharge planning process, led by the attending physician.  Recommendations may be updated based on patient status, additional functional criteria and insurance authorization. ? ?Follow Up Recommendations Skilled nursing-short term rehab (<3 hours/day) ? ?  ?Assistance Recommended at Discharge Frequent or constant Supervision/Assistance  ?Patient can  return home with the following ? A lot of help with walking and/or transfers;A lot of help with bathing/dressing/bathroom;Assistance with cooking/housework;Help with stairs or ramp for entrance ? ?  ?Equipment Recommendations None recommended by PT  ?Recommendations for Other Services ?    ?  ?Functional Status Assessment Patient has had a recent decline in their functional status and demonstrates the ability to make significant improvements in function in a reasonable and predictable amount of time.  ? ?  ?Precautions / Restrictions Precautions ?Precautions: Fall  ? ?  ? ?Mobility ? Bed Mobility ?Overal bed mobility: Needs Assistance ?Bed Mobility: Supine to Sit, Sit to Supine ?  ?  ?Supine to sit: Mod assist ?Sit to supine: Mod assist, HOB elevated ?  ?General bed mobility comments: cueing for sequencing, assist for weakness/pain ?  ? ?Transfers ?Overall transfer level: Needs assistance ?Equipment used: Rolling walker (2 wheels) ?Transfers: Sit to/from Stand ?Sit to Stand: Mod assist, Max assist ?  ?  ?  ?  ?  ?General transfer comment: assist for weakness/pain, cueing for RW use, patient motivated with several STS attempts ?  ? ?Ambulation/Gait ?  ?  ?  ?  ?  ?  ?  ?  ? ?Stairs ?  ?  ?  ?  ?  ? ?Wheelchair Mobility ?  ? ?Modified Rankin (Stroke Patients Only) ?  ? ?  ? ?Balance Overall balance assessment: Needs assistance ?Sitting-balance support: No upper extremity supported, Feet supported ?Sitting balance-Leahy Scale: Good ?Sitting balance - Comments: seated EOB ?  ?Standing balance support: Bilateral upper extremity supported ?Standing balance-Leahy Scale: Zero ?Standing balance comment: poor/zero with RW ?  ?  ?  ?  ?  ?  ?  ?  ?  ?  ?  ?   ? ? ? ?  Pertinent Vitals/Pain Pain Assessment ?Pain Assessment: No/denies pain  ? ? ?Home Living Family/patient expects to be discharged to:: Private residence ?Living Arrangements: Alone ?Available Help at Discharge: Family ?Type of Home: House ?Home Access: Level entry ?   ?  ?  ?Home Layout: One level ?Home Equipment: Conservation officer, nature (2 wheels);Rollator (4 wheels) ?Additional Comments: Patient does not use AD despite frequent attempts by family  ?  ?Prior Function Prior Level of Function : Independent/Modified Independent ?  ?  ?  ?  ?  ?  ?Mobility Comments: household ambulation using walls, objects for support ?ADLs Comments: family assists PRN ?  ? ? ?Hand Dominance  ?   ? ?  ?Extremity/Trunk Assessment  ? Upper Extremity Assessment ?Upper Extremity Assessment: Generalized weakness ?  ? ?Lower Extremity Assessment ?Lower Extremity Assessment: Generalized weakness ?  ? ?   ?Communication  ? Communication: HOH  ?Cognition Arousal/Alertness: Awake/alert ?Behavior During Therapy: [facility] for tasks assessed/performed ?Overall Cognitive Status: Within Functional Limits for tasks assessed ?  ?  ?  ?  ?  ?  ?  ?  ?  ?  ?  ?  ?  ?  ?  ?  ?  ?  ?  ? ?  ?General Comments   ? ?  ?Exercises    ? ?Assessment/Plan  ?  ?PT Assessment Patient needs continued PT services  ?PT Problem List Decreased strength;Decreased mobility;Decreased activity tolerance;Decreased balance;Decreased knowledge of use of DME;Pain;Decreased range of motion ? ?   ?  ?PT Treatment Interventions DME instruction;Therapeutic exercise;Gait training;Balance training;Stair training;Neuromuscular re-education;Functional mobility training;Therapeutic activities;Patient/family education   ? ?PT Goals (Current goals can be found in the Care Plan section)  ?Acute Rehab PT Goals ?Patient Stated Goal: return home ?PT Goal Formulation: With patient ?Time For Goal Achievement: 01/14/22 ?Potential to Achieve Goals: Fair ? ?  ?Frequency Min 3X/week ?  ? ? ?Co-evaluation   ?  ?  ?  ?  ? ? ?  ?AM-PAC PT "6 Clicks" Mobility  ?Outcome Measure Help needed turning from your back to your side while in a flat bed without using bedrails?: A Little ?Help needed moving from lying on your back to sitting on the side of a flat bed without using  bedrails?: A Lot ?Help needed moving to and from a bed to a chair (including a wheelchair)?: A Lot ?Help needed standing up from a chair using your arms (e.g., wheelchair or bedside chair)?: A Lot ?Help needed to walk in hospital room?: Total ?Help needed climbing 3-5 steps with a railing? : Total ?6 Click Score: 11 ? ?  ?End of Session Equipment Utilized During Treatment: Gait belt ?Activity Tolerance: Patient tolerated treatment well;Patient limited by pain ?Patient left: in bed;with call bell/phone within reach;with bed alarm set;with family/visitor present ?Nurse Communication: Mobility status ?PT Visit Diagnosis: Unsteadiness on feet (R26.81);Other abnormalities of gait and mobility (R26.89);Muscle weakness (generalized) (M62.81);Pain;History of falling (Z91.81) ?Pain - Right/Left: Left ?Pain - part of body: Hip ?  ? ?Time: 0630-1601 ?PT Time Calculation (min) (ACUTE ONLY): 30 min ? ? ?Charges:   PT Evaluation ?$PT Eval Moderate Complexity: 1 Mod ?PT Treatments ?$Therapeutic Activity: 23-37 mins ?  ?   ? ? ?2:49 PM, 12/31/21 ?Mearl Latin PT, DPT ?Physical Therapist at [facility] ?[facility] ? ? ?

## 2021-12-31 NOTE — Hospital Course (Signed)
Candice Ray  is a 88 y.o. female, with past medical history of diabetes mellitus, dementia, patient lives at home by herself with close family support, last time patient was seen on Monday by family, she is extremely hard of hearing, history was given by the daughter, patient was last seen by Monday, apparently she had a fall by Tuesday, family found her today, she was more confused than her baseline, patient is complaining of left upper leg pain, patient denies any dizziness, lightheadedness or loss of consciousness, per daughter mother refuses to use walker at home despite having 2 walkers. ?-In ED work-up significant for pelvic fracture, no hip fracture, labs significant for total CK elevated at 2000, but creatinine within normal limit, UA still pending, CT head and cervical spine with no acute findings.  ?

## 2022-01-01 DIAGNOSIS — Z7985 Long-term (current) use of injectable non-insulin antidiabetic drugs: Secondary | ICD-10-CM | POA: Diagnosis not present

## 2022-01-01 DIAGNOSIS — Z888 Allergy status to other drugs, medicaments and biological substances status: Secondary | ICD-10-CM | POA: Diagnosis not present

## 2022-01-01 DIAGNOSIS — S32592A Other specified fracture of left pubis, initial encounter for closed fracture: Secondary | ICD-10-CM

## 2022-01-01 DIAGNOSIS — E119 Type 2 diabetes mellitus without complications: Secondary | ICD-10-CM | POA: Diagnosis present

## 2022-01-01 DIAGNOSIS — W19XXXA Unspecified fall, initial encounter: Secondary | ICD-10-CM

## 2022-01-01 DIAGNOSIS — Z66 Do not resuscitate: Secondary | ICD-10-CM | POA: Diagnosis present

## 2022-01-01 DIAGNOSIS — Z7982 Long term (current) use of aspirin: Secondary | ICD-10-CM | POA: Diagnosis not present

## 2022-01-01 DIAGNOSIS — S32512A Fracture of superior rim of left pubis, initial encounter for closed fracture: Secondary | ICD-10-CM | POA: Diagnosis present

## 2022-01-01 DIAGNOSIS — Z79899 Other long term (current) drug therapy: Secondary | ICD-10-CM | POA: Diagnosis not present

## 2022-01-01 DIAGNOSIS — Z20822 Contact with and (suspected) exposure to covid-19: Secondary | ICD-10-CM | POA: Diagnosis present

## 2022-01-01 DIAGNOSIS — L89152 Pressure ulcer of sacral region, stage 2: Secondary | ICD-10-CM | POA: Diagnosis present

## 2022-01-01 DIAGNOSIS — H9190 Unspecified hearing loss, unspecified ear: Secondary | ICD-10-CM | POA: Diagnosis present

## 2022-01-01 DIAGNOSIS — W1830XA Fall on same level, unspecified, initial encounter: Secondary | ICD-10-CM | POA: Diagnosis present

## 2022-01-01 DIAGNOSIS — S329XXA Fracture of unspecified parts of lumbosacral spine and pelvis, initial encounter for closed fracture: Secondary | ICD-10-CM | POA: Diagnosis present

## 2022-01-01 DIAGNOSIS — M6282 Rhabdomyolysis: Secondary | ICD-10-CM | POA: Diagnosis present

## 2022-01-01 DIAGNOSIS — T796XXD Traumatic ischemia of muscle, subsequent encounter: Secondary | ICD-10-CM | POA: Diagnosis not present

## 2022-01-01 DIAGNOSIS — R2681 Unsteadiness on feet: Secondary | ICD-10-CM | POA: Diagnosis present

## 2022-01-01 DIAGNOSIS — Z91041 Radiographic dye allergy status: Secondary | ICD-10-CM | POA: Diagnosis not present

## 2022-01-01 DIAGNOSIS — F0390 Unspecified dementia without behavioral disturbance: Secondary | ICD-10-CM | POA: Diagnosis present

## 2022-01-01 DIAGNOSIS — Z7984 Long term (current) use of oral hypoglycemic drugs: Secondary | ICD-10-CM | POA: Diagnosis not present

## 2022-01-01 LAB — GLUCOSE, CAPILLARY
Glucose-Capillary: 186 mg/dL — ABNORMAL HIGH (ref 70–99)
Glucose-Capillary: 289 mg/dL — ABNORMAL HIGH (ref 70–99)
Glucose-Capillary: 255 mg/dL — ABNORMAL HIGH (ref 70–99)
Glucose-Capillary: 217 mg/dL — ABNORMAL HIGH (ref 70–99)

## 2022-01-01 LAB — BASIC METABOLIC PANEL
Anion gap: 6 (ref 5–15)
BUN: 22 mg/dL (ref 8–23)
CO2: 26 mmol/L (ref 22–32)
Calcium: 8.2 mg/dL — ABNORMAL LOW (ref 8.9–10.3)
Chloride: 104 mmol/L (ref 98–111)
Creatinine, Ser: 0.63 mg/dL (ref 0.44–1.00)
GFR, Estimated: >60 mL/min (ref >60)
Glucose, Bld: 207 mg/dL — ABNORMAL HIGH (ref 70–99)
Potassium: 3.8 mmol/L (ref 3.5–5.1)
Sodium: 136 mmol/L (ref 135–145)

## 2022-01-01 LAB — CK: Total CK: 566 U/L — ABNORMAL HIGH (ref 38–234)

## 2022-01-01 NOTE — Progress Notes (Signed)
PROGRESS NOTE    Patient: Candice Ray                            PCP: Jani Gravel, MD                    DOB: Apr 17, 1932            DOA: 12/30/2021 JAS:[id number]             DOS: 01/01/2022, 12:38 PM   LOS: 0 days   Date of Service: The patient was seen and examined on 01/01/2022  Subjective:   The patient was seen and examined, hemodynamically stable Still complaining of pain discomfort hip and pelvic area with any type of movement especially exertion  Brief Narrative:   Candice Ray  is a 88 y.o. female, with past medical history of diabetes mellitus, dementia, patient lives at home by herself with close family support, last time patient was seen on Monday by family, she is extremely hard of hearing, history was given by the daughter, patient was last seen by Monday, apparently she had a fall by Tuesday, family found her today, she was more confused than her baseline, patient is complaining of left upper leg pain, patient denies any dizziness, lightheadedness or loss of consciousness, per daughter mother refuses to use walker at home despite having 2 walkers. -In ED work-up significant for pelvic fracture, no hip fracture, labs significant for total CK elevated at 2000, but creatinine within normal limit, UA still pending, CT head and cervical spine with no acute findings.     Assessment & Plan:   Principal Problem:   Pelvic fracture (HCC) Active Problems:   Rhabdomyolysis   Diabetes mellitus ([place])   Dementia without behavioral disturbance (HCC)   Pressure injury of skin     Assessment and Plan:  Pelvic  fracture due to fall -Continue supportive therapy -Pain management --titrating pain patient's -Orthopedic Dr. Aline Brochure consulted Reviewed images, confirmed left superior and inferior pubic ramus fracture, recommending no intervention ... Continue with medical management  -Due to unsteady gait and poor balance, not associated with syncope or any neurological  deficits -Consulting PT/OT for evaluation --recommending SNF   Rhabdomyolysis -Total CK elevated at 2000 >> 964, 566 -Monitoring CK closely, creatinine closely -Continue IV fluid resuscitation -Avoiding nephrotoxins -Creatinine stable 0.66, 0.63   Dementia -High risk for delirium, continue with delirium precaution -Mentation at baseline   Diabetes mellitus -Hold Ozempic  -Checking CBG QA CHS, SSI coverage   Ethics: DNR/DNI   Nutritional status:  The patient's BMI is: Body mass index is 23.95 kg/m. I agree with the assessment and plan as outlined    Skin Assessment: I have examined the patient's skin and I agree with the wound assessment as performed by wound care team As outlined belowe:  Pressure Injury 12/31/21 Sacrum Right;Left Stage 2 -  Partial thickness loss of dermis presenting as a shallow open injury with a red, pink wound bed without slough. (Active)  12/31/21 0139  Location: Sacrum  Location Orientation: Right;Left  Staging: Stage 2 -  Partial thickness loss of dermis presenting as a shallow open injury with a red, pink wound bed without slough.  Wound Description (Comments):   Present on Admission: Yes   --------------------------------------------------------------------------------------------   DVT prophylaxis:  heparin injection 5,000 Units Start: 12/31/21 0600 SCDs Start: 12/31/21 0047   Code Status:   Code Status: DNR  Family Communication:  Findings  and plan of care was discussed with the patient daughter at bedside on admission  No family member present at bedside this a.m. -  attempt will be made to update daily   Admission status:   Status is: Observation The patient remains OBS appropriate and will d/c before 2 midnights.    Disposition: from home Discharge--likely home with home health versus SNF in 1-2 days        Procedures:   No admission procedures for hospital encounter.   Antimicrobials:  Anti-infectives (From  admission, onward)    None        Medication:   aspirin EC  81 mg Oral Daily   heparin  5,000 Units Subcutaneous Q8H   insulin aspart  0-5 Units Subcutaneous QHS   insulin aspart  0-9 Units Subcutaneous TID WC    acetaminophen **OR** acetaminophen, HYDROcodone-acetaminophen, ibuprofen, methocarbamol (ROBAXIN) IV, traZODone   Objective:   Vitals:   12/31/21 1500 12/31/21 2119 01/01/22 0440 01/01/22 0830  BP: 119/69 124/69 112/64 124/72  Pulse: (!) 102 97 92 (!) 104  Resp: '19 20 16 12  '$ Temp: 97.9 F (36.6 C) 98.5 F (36.9 C) (!) 97 F (36.1 C) 98.1 F (36.7 C)  TempSrc: Oral Oral  Oral  SpO2: 100% 98% 96% 96%  Weight:      Height:        Intake/Output Summary (Last 24 hours) at 01/01/2022 1238 Last data filed at 01/01/2022 0931 Gross per 24 hour  Intake 1590 ml  Output 1500 ml  Net 90 ml   Filed Weights   12/30/21 1515 12/30/21 1529 12/31/21 0119  Weight: 63.5 kg 59 kg 59.4 kg     Examination:      Physical Exam:   General:  Alert, oriented, cooperative, no distress;   HEENT:  Normocephalic, PERRL, otherwise with in Normal limits   Neuro:  CNII-XII intact. , normal motor and sensation, reflexes intact   Lungs:   Clear to auscultation BL, Respirations unlabored, no wheezes / crackles  Cardio:    S1/S2, RRR, No murmure, No Rubs or Gallops   Abdomen:   Soft, non-tender, bowel sounds active all four quadrants,  no guarding or peritoneal signs.  Muscular skeletal:  Limited exam -complaining of hip pain when any type of movement even in bed  Remain in bed, able to move all 4 extremities, severe global generalized weaknesses 2+ pulses,  symmetric, No pitting edema  Skin:  Dry, warm to touch, negative for any Rashes,  Wounds: Please see nursing documentation  Pressure Injury 12/31/21 Sacrum Right;Left Stage 2 -  Partial thickness loss of dermis presenting as a shallow open injury with a red, pink wound bed without slough. (Active)  12/31/21 0139  Location:  Sacrum  Location Orientation: Right;Left  Staging: Stage 2 -  Partial thickness loss of dermis presenting as a shallow open injury with a red, pink wound bed without slough.  Wound Description (Comments):   Present on Admission: Yes            LABs:  CBC Latest Ref Rng & Units 12/31/2021 12/30/2021 03/17/2021  WBC 4.0 - 10.5 K/uL 9.3 12.4(H) 4.9  Hemoglobin 12.0 - 15.0 g/dL 12.9 15.7(H) 13.6  Hematocrit 36.0 - 46.0 % 41.1 47.3(H) 41.9  Platelets 150 - 400 K/uL 160 185 177   CMP Latest Ref Rng & Units 01/01/2022 12/31/2021 12/30/2021  Glucose 70 - 99 mg/dL 207(H) 212(H) 181(H)  BUN 8 - 23 mg/dL 22 28(H) 30(H)  Creatinine 0.44 -  1.00 mg/dL 0.63 0.66 0.81  Sodium 135 - 145 mmol/L 136 135 137  Potassium 3.5 - 5.1 mmol/L 3.8 4.0 4.1  Chloride 98 - 111 mmol/L 104 105 100  CO2 22 - 32 mmol/L 26 21(L) 19(L)  Calcium 8.9 - 10.3 mg/dL 8.2(L) 8.4(L) 9.3  Total Protein 6.5 - 8.1 g/dL - - 7.5  Total Bilirubin 0.3 - 1.2 mg/dL - - 2.5(H)  Alkaline Phos 38 - 126 U/L - - 52  AST 15 - 41 U/L - - 122(H)  ALT 0 - 44 U/L - - 43       Micro Results Recent Results (from the past 240 hour(s))  Resp Panel by RT-PCR (Flu A&B, Covid) Nasopharyngeal Swab     Status: None   Collection Time: 12/30/21  5:54 PM   Specimen: Nasopharyngeal Swab; Nasopharyngeal(NP) swabs in vial transport medium  Result Value Ref Range Status   SARS Coronavirus 2 by RT PCR NEGATIVE NEGATIVE Final    Comment: (NOTE) SARS-CoV-2 target nucleic acids are NOT DETECTED.  The SARS-CoV-2 RNA is generally detectable in upper respiratory specimens during the acute phase of infection. The lowest concentration of SARS-CoV-2 viral copies this assay can detect is 138 copies/mL. A negative result does not preclude SARS-Cov-2 infection and should not be used as the sole basis for treatment or other patient management decisions. A negative result may occur with  improper specimen collection/handling, submission of specimen other than  nasopharyngeal swab, presence of viral mutation(s) within the areas targeted by this assay, and inadequate number of viral copies(<138 copies/mL). A negative result must be combined with clinical observations, patient history, and epidemiological information. The expected result is Negative.  Fact Sheet for Patients:  EntrepreneurPulse.com.au  Fact Sheet for Healthcare Providers:  IncredibleEmployment.be  This test is no t yet approved or cleared by the [place] FDA and  has been authorized for detection and/or diagnosis of SARS-CoV-2 by FDA under an Emergency Use Authorization (EUA). This EUA will remain  in effect (meaning this test can be used) for the duration of the COVID-19 declaration under Section 564(b)(1) of the Act, 21 U.S.C.section 360bbb-3(b)(1), unless the authorization is terminated  or revoked sooner.       Influenza A by PCR NEGATIVE NEGATIVE Final   Influenza B by PCR NEGATIVE NEGATIVE Final    Comment: (NOTE) The Xpert Xpress SARS-CoV-2/FLU/RSV plus assay is intended as an aid in the diagnosis of influenza from Nasopharyngeal swab specimens and should not be used as a sole basis for treatment. Nasal washings and aspirates are unacceptable for Xpert Xpress SARS-CoV-2/FLU/RSV testing.  Fact Sheet for Patients: EntrepreneurPulse.com.au  Fact Sheet for Healthcare Providers: IncredibleEmployment.be  This test is not yet approved or cleared by the [place] FDA and has been authorized for detection and/or diagnosis of SARS-CoV-2 by FDA under an Emergency Use Authorization (EUA). This EUA will remain in effect (meaning this test can be used) for the duration of the COVID-19 declaration under Section 564(b)(1) of the Act, 21 U.S.C. section 360bbb-3(b)(1), unless the authorization is terminated or revoked.  Performed at [facility], [address] [place], [place] [address]      Radiology Reports No results found.  SIGNED: Deatra James, MD, FHM. [facility],  Pager (please use [email or web address] to page/text) Please use Epic Secure Chat for non-urgent communication (7AM-7PM)  If 7PM-7AM, please contact night-coverage [email or web address], 01/01/2022, 12:38 PM

## 2022-01-01 NOTE — Consult Note (Signed)
Reason for Consult: Left hip pain status post fall Referring Physician: Deatra James, MD      Attending    Since 12/31/2021    [phone]   Assessment and plan 88 year old female with nonoperative left pelvic fracture  Weight-bear as tolerated  X-ray in 6 weeks in the office  Candice Ray is an 88 y.o. female.  HPI: 88 year old female came in secondary to a mechanical fall complained of left hip pain imaging confirms had a left hip fracture.  Her injury date was approximately Tuesday, February 28 Pain severe Pain increased with movement decreased with rest Associated with inability to weight-bear  Additional history taken from the record Candice Ray  is a 88 y.o. female, with past medical history of diabetes mellitus, dementia, patient lives at home by herself with close family support, last time patient was seen on Monday by family, she is extremely hard of hearing, history was given by the daughter, patient was last seen by Monday, apparently she had a fall by Tuesday, family found her today, she was more confused than her baseline, patient is complaining of left upper leg pain, patient denies any dizziness, lightheadedness or loss of consciousness, per daughter mother refuses to use walker at home despite having 2 walkers.   Past Medical History:  Diagnosis Date   Diabetes mellitus    HOH (hard of hearing)    Poor historian    Sinus trouble     Past Surgical History:  Procedure Laterality Date   COLONOSCOPY  2007   Dr. Gala Romney: tubular adenomas/rectal   COLONOSCOPY N/A 01/06/2014   RFF:[id number] diverticulosis. Colonic polyp-removed as described above. Status post segmental biopsy and stool collection for culture   HERNIA REPAIR     umbilical, [place]   HYSTEROSCOPY WITH D & C  05/23/2012   Procedure: DILATATION AND CURETTAGE /HYSTEROSCOPY;  Surgeon: Florian Buff, MD;  Location: [facility];  Service: Gynecology;  Laterality: N/A;   POLYPECTOMY  05/23/2012   Procedure:  POLYPECTOMY;  Surgeon: Florian Buff, MD;  Location: [facility];  Service: Gynecology;  Laterality: N/A;  endometrial polypectomy    Family History  Problem Relation Age of Onset   Colon cancer Neg Hx     Social History:  reports that she has never smoked. She has never used smokeless tobacco. She reports that she does not drink alcohol and does not use drugs.  Allergies:  Allergies  Allergen Reactions   Contrast Media [Iodinated Contrast Media] Swelling   Metrizamide Swelling    Medications:  Current Facility-Administered Medications:    0.9 %  sodium chloride infusion, , Intravenous, Continuous, Shahmehdi, Seyed A, MD, Last Rate: 50 mL/hr at 12/31/21 2258, New Bag at 12/31/21 2258   acetaminophen (TYLENOL) tablet 650 mg, 650 mg, Oral, Q6H PRN **OR** acetaminophen (TYLENOL) suppository 650 mg, 650 mg, Rectal, Q6H PRN, Elgergawy, Silver Huguenin, MD   aspirin EC tablet 81 mg, 81 mg, Oral, Daily, Elgergawy, Silver Huguenin, MD, 81 mg at 12/31/21 0845   heparin injection 5,000 Units, 5,000 Units, Subcutaneous, Q8H, Elgergawy, Silver Huguenin, MD, 5,000 Units at 01/01/22 0544   HYDROcodone-acetaminophen (NORCO/VICODIN) 5-325 MG per tablet 1-2 tablet, 1-2 tablet, Oral, Q4H PRN, Elgergawy, Silver Huguenin, MD, 1 tablet at 12/31/21 2208   ibuprofen (ADVIL) tablet 600 mg, 600 mg, Oral, Q6H PRN, Shahmehdi, Seyed A, MD   insulin aspart (novoLOG) injection 0-5 Units, 0-5 Units, Subcutaneous, QHS, Elgergawy, Silver Huguenin, MD, 3 Units at 12/30/21 2239   insulin aspart (novoLOG) injection 0-9  Units, 0-9 Units, Subcutaneous, TID WC, Elgergawy, Silver Huguenin, MD, 2 Units at 01/01/22 0850   methocarbamol (ROBAXIN) 500 mg in dextrose 5 % 50 mL IVPB, 500 mg, Intravenous, Q6H PRN, Elgergawy, Silver Huguenin, MD   traZODone (DESYREL) tablet 50 mg, 50 mg, Oral, QHS PRN, Elgergawy, Silver Huguenin, MD, 50 mg at 12/31/21 2208 Independent assessment of x-ray of the hip and pelvis  Results for orders placed or performed during the hospital encounter of 12/30/21  (from the past 48 hour(s))  CK     Status: Abnormal   Collection Time: 12/30/21  5:30 PM  Result Value Ref Range   Total CK 2,062 (H) 38 - 234 U/L    Comment: Performed at [facility], [address], [place], [place] [address]  CBC with Differential/Platelet     Status: Abnormal   Collection Time: 12/30/21  5:43 PM  Result Value Ref Range   WBC 12.4 (H) 4.0 - 10.5 K/uL   RBC 5.03 3.87 - 5.11 MIL/uL   Hemoglobin 15.7 (H) 12.0 - 15.0 g/dL   HCT 47.3 (H) 36.0 - 46.0 %   MCV 94.0 80.0 - 100.0 fL   MCH 31.2 26.0 - 34.0 pg   MCHC 33.2 30.0 - 36.0 g/dL   RDW 13.5 11.5 - 15.5 %   Platelets 185 150 - 400 K/uL   nRBC 0.0 0.0 - 0.2 %   Neutrophils Relative % 82 %   Neutro Abs 10.2 (H) 1.7 - 7.7 K/uL   Lymphocytes Relative 9 %   Lymphs Abs 1.1 0.7 - 4.0 K/uL   Monocytes Relative 9 %   Monocytes Absolute 1.1 (H) 0.1 - 1.0 K/uL   Eosinophils Relative 0 %   Eosinophils Absolute 0.0 0.0 - 0.5 K/uL   Basophils Relative 0 %   Basophils Absolute 0.1 0.0 - 0.1 K/uL   Immature Granulocytes 0 %   Abs Immature Granulocytes 0.04 0.00 - 0.07 K/uL    Comment: Performed at [facility], [address], [place], [place] [address]  Comprehensive metabolic panel     Status: Abnormal   Collection Time: 12/30/21  5:43 PM  Result Value Ref Range   Sodium 137 135 - 145 mmol/L   Potassium 4.1 3.5 - 5.1 mmol/L   Chloride 100 98 - 111 mmol/L   CO2 19 (L) 22 - 32 mmol/L   Glucose, Bld 181 (H) 70 - 99 mg/dL    Comment: Glucose reference range applies only to samples taken after fasting for at least 8 hours.   BUN 30 (H) 8 - 23 mg/dL   Creatinine, Ser 0.81 0.44 - 1.00 mg/dL   Calcium 9.3 8.9 - 10.3 mg/dL   Total Protein 7.5 6.5 - 8.1 g/dL   Albumin 4.5 3.5 - 5.0 g/dL   AST 122 (H) 15 - 41 U/L   ALT 43 0 - 44 U/L   Alkaline Phosphatase 52 38 - 126 U/L   Total Bilirubin 2.5 (H) 0.3 - 1.2 mg/dL   GFR, Estimated >60 >60 mL/min    Comment: (NOTE) Calculated using the CKD-EPI Creatinine Equation (2021)     Anion gap 18 (H) 5 - 15    Comment: Performed at [facility], [address], [place], [place] [address]  Troponin I (High Sensitivity)     Status: None   Collection Time: 12/30/21  5:43 PM  Result Value Ref Range   Troponin I (High Sensitivity) 13 <18 ng/L    Comment: (NOTE) Elevated high sensitivity troponin I (hsTnI)  values and significant  changes across serial measurements may suggest ACS but many other  chronic and acute conditions are known to elevate hsTnI results.  Refer to the "Links" section for chest pain algorithms and additional  guidance. Performed at [facility], [address] [place], [place] [address]   Hemoglobin A1c     Status: Abnormal   Collection Time: 12/30/21  5:49 PM  Result Value Ref Range   Hgb A1c MFr Bld 8.4 (H) 4.8 - 5.6 %    Comment: (NOTE) Pre diabetes:          5.7%-6.4%  Diabetes:              >6.4%  Glycemic control for   <7.0% adults with diabetes    Mean Plasma Glucose 194.38 mg/dL    Comment: Performed at [facility] [address], [place], [place] [address]  Resp Panel by RT-PCR (Flu A&B, Covid) Nasopharyngeal Swab     Status: None   Collection Time: 12/30/21  5:54 PM   Specimen: Nasopharyngeal Swab; Nasopharyngeal(NP) swabs in vial transport medium  Result Value Ref Range   SARS Coronavirus 2 by RT PCR NEGATIVE NEGATIVE    Comment: (NOTE) SARS-CoV-2 target nucleic acids are NOT DETECTED.  The SARS-CoV-2 RNA is generally detectable in upper respiratory specimens during the acute phase of infection. The lowest concentration of SARS-CoV-2 viral copies this assay can detect is 138 copies/mL. A negative result does not preclude SARS-Cov-2 infection and should not be used as the sole basis for treatment or other patient management decisions. A negative result may occur with  improper specimen collection/handling, submission of specimen other than nasopharyngeal swab, presence of viral mutation(s) within the areas targeted by  this assay, and inadequate number of viral copies(<138 copies/mL). A negative result must be combined with clinical observations, patient history, and epidemiological information. The expected result is Negative.  Fact Sheet for Patients:  EntrepreneurPulse.com.au  Fact Sheet for Healthcare Providers:  IncredibleEmployment.be  This test is no t yet approved or cleared by the [place] FDA and  has been authorized for detection and/or diagnosis of SARS-CoV-2 by FDA under an Emergency Use Authorization (EUA). This EUA will remain  in effect (meaning this test can be used) for the duration of the COVID-19 declaration under Section 564(b)(1) of the Act, 21 U.S.C.section 360bbb-3(b)(1), unless the authorization is terminated  or revoked sooner.       Influenza A by PCR NEGATIVE NEGATIVE   Influenza B by PCR NEGATIVE NEGATIVE    Comment: (NOTE) The Xpert Xpress SARS-CoV-2/FLU/RSV plus assay is intended as an aid in the diagnosis of influenza from Nasopharyngeal swab specimens and should not be used as a sole basis for treatment. Nasal washings and aspirates are unacceptable for Xpert Xpress SARS-CoV-2/FLU/RSV testing.  Fact Sheet for Patients: EntrepreneurPulse.com.au  Fact Sheet for Healthcare Providers: IncredibleEmployment.be  This test is not yet approved or cleared by the [place] FDA and has been authorized for detection and/or diagnosis of SARS-CoV-2 by FDA under an Emergency Use Authorization (EUA). This EUA will remain in effect (meaning this test can be used) for the duration of the COVID-19 declaration under Section 564(b)(1) of the Act, 21 U.S.C. section 360bbb-3(b)(1), unless the authorization is terminated or revoked.  Performed at [facility], [address] [place], [place] [address]   Troponin I (High Sensitivity)     Status: None   Collection Time: 12/30/21  6:33 PM  Result  Value Ref Range  Troponin I (High Sensitivity) 11 <18 ng/L    Comment: (NOTE) Elevated high sensitivity troponin I (hsTnI) values and significant  changes across serial measurements may suggest ACS but many other  chronic and acute conditions are known to elevate hsTnI results.  Refer to the "Links" section for chest pain algorithms and additional  guidance. Performed at [facility], [address] [place], [place] [address]   CBG monitoring, ED     Status: Abnormal   Collection Time: 12/30/21 10:38 PM  Result Value Ref Range   Glucose-Capillary 272 (H) 70 - 99 mg/dL    Comment: Glucose reference range applies only to samples taken after fasting for at least 8 hours.  CK     Status: Abnormal   Collection Time: 12/31/21  5:38 AM  Result Value Ref Range   Total CK 964 (H) 38 - 234 U/L    Comment: Performed at [facility], [address] [place], [place] [address]  Basic metabolic panel     Status: Abnormal   Collection Time: 12/31/21  5:38 AM  Result Value Ref Range   Sodium 135 135 - 145 mmol/L   Potassium 4.0 3.5 - 5.1 mmol/L   Chloride 105 98 - 111 mmol/L   CO2 21 (L) 22 - 32 mmol/L   Glucose, Bld 212 (H) 70 - 99 mg/dL    Comment: Glucose reference range applies only to samples taken after fasting for at least 8 hours.   BUN 28 (H) 8 - 23 mg/dL   Creatinine, Ser 0.66 0.44 - 1.00 mg/dL   Calcium 8.4 (L) 8.9 - 10.3 mg/dL   GFR, Estimated >60 >60 mL/min    Comment: (NOTE) Calculated using the CKD-EPI Creatinine Equation (2021)    Anion gap 9 5 - 15    Comment: Performed at [facility], [address] [place], [place] [address]  CBC     Status: None   Collection Time: 12/31/21  5:38 AM  Result Value Ref Range   WBC 9.3 4.0 - 10.5 K/uL   RBC 4.35 3.87 - 5.11 MIL/uL   Hemoglobin 12.9 12.0 - 15.0 g/dL   HCT 41.1 36.0 - 46.0 %   MCV 94.5 80.0 - 100.0 fL   MCH 29.7 26.0 - 34.0 pg   MCHC 31.4 30.0 - 36.0 g/dL   RDW 13.3 11.5 - 15.5 %   Platelets 160 150 - 400 K/uL   nRBC  0.0 0.0 - 0.2 %    Comment: Performed at [facility], [address] [place], [place] [address]  Glucose, capillary     Status: Abnormal   Collection Time: 12/31/21  7:38 AM  Result Value Ref Range   Glucose-Capillary 204 (H) 70 - 99 mg/dL    Comment: Glucose reference range applies only to samples taken after fasting for at least 8 hours.  Glucose, capillary     Status: Abnormal   Collection Time: 12/31/21 11:18 AM  Result Value Ref Range   Glucose-Capillary 212 (H) 70 - 99 mg/dL    Comment: Glucose reference range applies only to samples taken after fasting for at least 8 hours.  Glucose, capillary     Status: Abnormal   Collection Time: 12/31/21  4:43 PM  Result Value Ref Range   Glucose-Capillary 350 (H) 70 - 99 mg/dL    Comment: Glucose reference range applies only to samples taken after fasting for at least 8 hours.  Glucose, capillary     Status: Abnormal   Collection Time: 12/31/21  9:16 PM  Result Value Ref Range   Glucose-Capillary 183 (H) 70 - 99 mg/dL    Comment: Glucose reference range applies only to samples taken after fasting for at least 8 hours.  Basic metabolic panel     Status: Abnormal   Collection Time: 01/01/22  5:06 AM  Result Value Ref Range   Sodium 136 135 - 145 mmol/L   Potassium 3.8 3.5 - 5.1 mmol/L   Chloride 104 98 - 111 mmol/L   CO2 26 22 - 32 mmol/L   Glucose, Bld 207 (H) 70 - 99 mg/dL    Comment: Glucose reference range applies only to samples taken after fasting for at least 8 hours.   BUN 22 8 - 23 mg/dL   Creatinine, Ser 0.63 0.44 - 1.00 mg/dL   Calcium 8.2 (L) 8.9 - 10.3 mg/dL   GFR, Estimated >60 >60 mL/min    Comment: (NOTE) Calculated using the CKD-EPI Creatinine Equation (2021)    Anion gap 6 5 - 15    Comment: Performed at [facility], [address] [place], [place] [address]  CK     Status: Abnormal   Collection Time: 01/01/22  5:06 AM  Result Value Ref Range   Total CK 566 (H) 38 - 234 U/L    Comment: Performed at [facility], [address], [place], [place] [address]  Glucose, capillary     Status: Abnormal   Collection Time: 01/01/22  7:12 AM  Result Value Ref Range   Glucose-Capillary 186 (H) 70 - 99 mg/dL    Comment: Glucose reference range applies only to samples taken after fasting for at least 8 hours.    DG Chest 2 View  Result Date: 12/30/2021 CLINICAL DATA:  Fall EXAM: CHEST - 2 VIEW COMPARISON:  02/06/2018 FINDINGS: Cardiomegaly. Aortic atherosclerosis. No confluent opacities, effusions or edema. No acute bony abnormality. IMPRESSION: Mild cardiomegaly.  No active disease. Electronically Signed   By: Rolm Baptise M.D.   On: 12/30/2021 17:30   CT Head Wo Contrast  Result Date: 12/30/2021 CLINICAL DATA:  Head trauma, intracranial arterial injury suspected. Fall. EXAM: CT HEAD WITHOUT CONTRAST TECHNIQUE: Contiguous axial images were obtained from the base of the skull through the vertex without intravenous contrast. RADIATION DOSE REDUCTION: This exam was performed according to the departmental dose-optimization program which includes automated exposure control, adjustment of the mA and/or kV according to patient size and/or use of iterative reconstruction technique. COMPARISON:  03/03/2020 FINDINGS: Brain: There is atrophy and chronic small vessel disease changes. No acute intracranial abnormality. Specifically, no hemorrhage, hydrocephalus, mass lesion, acute infarction, or significant intracranial injury. Vascular: No hyperdense vessel or unexpected calcification. Skull: No acute calvarial abnormality. Sinuses/Orbits: No acute findings Other: None IMPRESSION: Atrophy, chronic microvascular disease. No acute intracranial abnormality. Electronically Signed   By: Rolm Baptise M.D.   On: 12/30/2021 17:17   CT Cervical Spine Wo Contrast  Result Date: 12/30/2021 CLINICAL DATA:  Neck trauma (Age >= 65y).  Fall EXAM: CT CERVICAL SPINE WITHOUT CONTRAST TECHNIQUE: Multidetector CT imaging of the cervical spine was  performed without intravenous contrast. Multiplanar CT image reconstructions were also generated. RADIATION DOSE REDUCTION: This exam was performed according to the departmental dose-optimization program which includes automated exposure control, adjustment of the mA and/or kV according to patient size and/or use of iterative reconstruction technique. COMPARISON:  03/03/2020 FINDINGS: Alignment: Normal Skull base and vertebrae: No acute fracture. No primary bone lesion or focal pathologic process. Soft tissues and spinal canal: No  prevertebral fluid or swelling. No visible canal hematoma. Disc levels: Moderate diffuse degenerative disc disease and facet disease bilaterally, left greater than right. Upper chest: No acute findings Other: 8 mm low-density nodule in the right thyroid lobe. Not clinically significant; no follow-up imaging recommended (ref: J Am Coll Radiol. 2015 Feb;12(2): 143-50). IMPRESSION: Degenerative changes in the cervical spine. No acute bony abnormality. Electronically Signed   By: Rolm Baptise M.D.   On: 12/30/2021 17:18   DG Hip Unilat W or Wo Pelvis 2-3 Views Left  Result Date: 12/30/2021 CLINICAL DATA:  Fall EXAM: DG HIP (WITH OR WITHOUT PELVIS) 2-3V LEFT COMPARISON:  07/27/2012 FINDINGS: Fractures are noted through the left superior and inferior pubic rami. Mild displacement of the superior pubic ramus fracture. No fracture in the proximal left femur. No subluxation or dislocation. IMPRESSION: Left superior and inferior pubic rami fractures. Electronically Signed   By: Rolm Baptise M.D.   On: 12/30/2021 17:29    Review of Systems  The patient was hard of hearing it was very difficult to obtain the history and the review of systems  She did not complain of any numbness in the left lower extremity she does not complain of any generalized weakness or dizziness   Blood pressure 124/72, pulse (!) 104, temperature 98.1 F (36.7 C), temperature source Oral, resp. rate 12, height '5\' 2"'$   (1.575 m), weight 59.4 kg, SpO2 96 %. Physical Exam  General no developmental abnormalities  Cardiovascular normal pulse perfusion and color without edema both lower extremities  She was awake and alert and seemed to be oriented to person place and time again hard of hearing  Neurological exam normal sensation in both lower extremities and upper extremities  Musculoskeletal  Upper extremities No tenderness, no contractures, no instability, normal muscle tone and normal skin  Left lower extremity Skin normal, tenderness proximal pelvis and groin area.  No instability.  No contractures.  Normal muscle tone.    Assessment/Plan: Independent assessment of the x-ray left hip with pelvis My interpretation is  Left superior and inferior pubic ramus fracture  Arther Abbott 01/01/2022, 9:54 AM

## 2022-01-01 NOTE — Progress Notes (Addendum)
The patient is injury-free, afebrile, alert, and oriented X 2. Vital signs were within the baseline during this shift. She complained of hip pain which controlled with the current pain regimen. Pt denies chest pain, SOB, nausea, vomiting, dizziness, signs or symptoms of bleeding, or acute changes during this shift. We will continue to monitor and work toward achieving the care plan goals. ?

## 2022-01-01 NOTE — Plan of Care (Signed)
?  Problem: Education: ?Goal: Knowledge of General Education information will improve ?Description: Including pain rating scale, medication(s)/side effects and non-pharmacologic comfort measures ?Outcome: Progressing ?  ?Problem: Health Behavior/Discharge Planning: ?Goal: Ability to manage health-related needs will improve ?Outcome: Progressing ?  ?Problem: Clinical Measurements: ?Goal: Ability to maintain clinical measurements within normal limits will improve ?Outcome: Progressing ?Goal: Will remain free from infection ?Outcome: Progressing ?Goal: Diagnostic test results will improve ?Outcome: Progressing ?Goal: Respiratory complications will improve ?Outcome: Progressing ?Goal: Cardiovascular complication will be avoided ?Outcome: Progressing ?  ?Problem: Coping: ?Goal: Level of anxiety will decrease ?Outcome: Progressing ?  ?Problem: Elimination: ?Goal: Will not experience complications related to bowel motility ?Outcome: Progressing ?Goal: Will not experience complications related to urinary retention ?Outcome: Progressing ?  ?Problem: Pain Managment: ?Goal: General experience of comfort will improve ?Outcome: Progressing ?  ?Problem: Safety: ?Goal: Ability to remain free from injury will improve ?Outcome: Progressing ?  ?Problem: Skin Integrity: ?Goal: Risk for impaired skin integrity will decrease ?Outcome: Progressing ?  ?Problem: Education: ?Goal: Knowledge of General Education information will improve ?Description: Including pain rating scale, medication(s)/side effects and non-pharmacologic comfort measures ?Outcome: Progressing ?  ?Problem: Health Behavior/Discharge Planning: ?Goal: Ability to manage health-related needs will improve ?Outcome: Progressing ?  ?Problem: Clinical Measurements: ?Goal: Ability to maintain clinical measurements within normal limits will improve ?Outcome: Progressing ?Goal: Will remain free from infection ?Outcome: Progressing ?Goal: Diagnostic test results will  improve ?Outcome: Progressing ?Goal: Respiratory complications will improve ?Outcome: Progressing ?Goal: Cardiovascular complication will be avoided ?Outcome: Progressing ?  ?Problem: Nutrition: ?Goal: Adequate nutrition will be maintained ?Outcome: Progressing ?  ?

## 2022-01-01 NOTE — Progress Notes (Signed)
CSW reviewed HUB and patient currently has no bed offers. ? ?Madilyn Fireman, MSW, LCSW ?Transitions of Care  Clinical Social Worker II ?[phone] ? ?

## 2022-01-02 LAB — BASIC METABOLIC PANEL
Anion gap: 3 — ABNORMAL LOW (ref 5–15)
BUN: 19 mg/dL (ref 8–23)
CO2: 30 mmol/L (ref 22–32)
Calcium: 8.3 mg/dL — ABNORMAL LOW (ref 8.9–10.3)
Chloride: 104 mmol/L (ref 98–111)
Creatinine, Ser: 0.63 mg/dL (ref 0.44–1.00)
GFR, Estimated: >60 mL/min (ref >60)
Glucose, Bld: 212 mg/dL — ABNORMAL HIGH (ref 70–99)
Potassium: 4.2 mmol/L (ref 3.5–5.1)
Sodium: 137 mmol/L (ref 135–145)

## 2022-01-02 LAB — GLUCOSE, CAPILLARY
Glucose-Capillary: 197 mg/dL — ABNORMAL HIGH (ref 70–99)
Glucose-Capillary: 295 mg/dL — ABNORMAL HIGH (ref 70–99)
Glucose-Capillary: 219 mg/dL — ABNORMAL HIGH (ref 70–99)
Glucose-Capillary: 205 mg/dL — ABNORMAL HIGH (ref 70–99)

## 2022-01-02 LAB — CK: Total CK: 249 U/L — ABNORMAL HIGH (ref 38–234)

## 2022-01-02 NOTE — Progress Notes (Signed)
CSW was notified by RN that patient's family wanted placement at [facility] in [place]. CSW faxed patient's clinicals there for review. ? ?Madilyn Fireman, MSW, LCSW ?Transitions of Care  Clinical Social Worker II ?[phone] ? ?

## 2022-01-02 NOTE — Progress Notes (Signed)
PROGRESS NOTE    Patient: Candice Ray                            PCP: Jani Gravel, MD                    DOB: [birth year]            DOA: 12/30/2021 [id number]             DOS: 01/02/2022, 12:35 PM   LOS: 1 day   Date of Service: The patient was seen and examined on 01/02/2022  Subjective:   The patient was seen and examined this morning, remained stable Still complaining of pain with any movement even in bed and pelvic and hip area Comfortable at rest  Brief Narrative:   Candice Ray  is a 88 y.o. female, with past medical history of diabetes mellitus, dementia, patient lives at home by herself with close family support, last time patient was seen on Monday by family, she is extremely hard of hearing, history was given by the daughter, patient was last seen by Monday, apparently she had a fall by Tuesday, family found her today, she was more confused than her baseline, patient is complaining of left upper leg pain, patient denies any dizziness, lightheadedness or loss of consciousness, per daughter mother refuses to use walker at home despite having 2 walkers. -In ED work-up significant for pelvic fracture, no hip fracture, labs significant for total CK elevated at 2000, but creatinine within normal limit, UA still pending, CT head and cervical spine with no acute findings.     Assessment & Plan:   Principal Problem:   Pelvic fracture (HCC) Active Problems:   Rhabdomyolysis   Diabetes mellitus ([place])   Dementia without behavioral disturbance (HCC)   Pressure injury of skin     Assessment and Plan:  Pelvic  fracture due to fall -Continue to complain of pain with any exertion even in bed Otherwise hemodynamically stable  -Pain management --titrating pain patient's -Orthopedic Dr. Aline Brochure consulted Reviewed images, confirmed left superior and inferior pubic ramus fracture, recommending no intervention ... Continue with medical management  -Due to unsteady gait and poor  balance, not associated with syncope or any neurological deficits -Consulting PT/OT for evaluation --recommending SNF   Rhabdomyolysis -Total CK elevated at 2000 >> 964, 566 >> 249 -Monitoring CK closely, creatinine closely -Continue IV fluid resuscitation --reducing IV fluids rate -Avoiding nephrotoxins -Creatinine stable 0.66, 0.63, 0.63   Dementia -High risk for delirium, continue with delirium precaution -Mentation at baseline   Diabetes mellitus -Hold Ozempic  -Checking CBG QA CHS, SSI coverage   Ethics: DNR/DNI   Nutritional status:  The patient's BMI is: Body mass index is 23.95 kg/m. I agree with the assessment and plan as outlined    Skin Assessment: I have examined the patient's skin and I agree with the wound assessment as performed by wound care team As outlined belowe:  Pressure Injury 12/31/21 Sacrum Right;Left Stage 2 -  Partial thickness loss of dermis presenting as a shallow open injury with a red, pink wound bed without slough. (Active)  12/31/21 0139  Location: Sacrum  Location Orientation: Right;Left  Staging: Stage 2 -  Partial thickness loss of dermis presenting as a shallow open injury with a red, pink wound bed without slough.  Wound Description (Comments):   Present on Admission: Yes   --------------------------------------------------------------------------------------------   DVT prophylaxis:  heparin injection  5,000 Units Start: 12/31/21 0600 SCDs Start: 12/31/21 0047   Code Status:   Code Status: DNR  Family Communication:  Findings and plan of care was discussed with the patient daughter at bedside on admission  No family member present at bedside this a.m. -  attempt will be made to update daily   Admission status:   Status is: Observation The patient remains OBS appropriate and will d/c before 2 midnights.    Disposition: from home Discharge--likely home with home health versus SNF in 1 day        Procedures:    No admission procedures for hospital encounter.   Antimicrobials:  Anti-infectives (From admission, onward)    None        Medication:   aspirin EC  81 mg Oral Daily   heparin  5,000 Units Subcutaneous Q8H   insulin aspart  0-5 Units Subcutaneous QHS   insulin aspart  0-9 Units Subcutaneous TID WC    acetaminophen **OR** acetaminophen, HYDROcodone-acetaminophen, ibuprofen, methocarbamol (ROBAXIN) IV, traZODone   Objective:   Vitals:   01/01/22 0830 01/01/22 1408 01/01/22 2226 01/02/22 0457  BP: 124/72 114/66 111/65 137/65  Pulse: (!) 104 (!) 102 88 84  Resp: '12  20 16  '$ Temp: 98.1 F (36.7 C) 97.9 F (36.6 C) 97.6 F (36.4 C) 98.3 F (36.8 C)  TempSrc: Oral Oral Oral   SpO2: 96% 95% 92% 97%  Weight:      Height:        Intake/Output Summary (Last 24 hours) at 01/02/2022 1235 Last data filed at 01/02/2022 0618 Gross per 24 hour  Intake 480 ml  Output 1875 ml  Net -1395 ml   Filed Weights   12/30/21 1515 12/30/21 1529 12/31/21 0119  Weight: 63.5 kg 59 kg 59.4 kg     Examination:      Physical Exam:    Physical Exam:   General:  Alert, oriented, cooperative, no distress;   HEENT:  Normocephalic, PERRL, otherwise with in Normal limits   Neuro:  CNII-XII intact. , normal motor and sensation, reflexes intact   Lungs:   Clear to auscultation BL, Respirations unlabored, no wheezes / crackles  Cardio:    S1/S2, RRR, No murmure, No Rubs or Gallops   Abdomen:   Soft, non-tender, bowel sounds active all four quadrants,  no guarding or peritoneal signs.  Muscular skeletal:  Severe global generalized weaknesses  Limited exam - in bed, able to move all 4 extremities, BL lower extremity reduced strength,  2+ pulses,  symmetric, No pitting edema  Skin:  Dry, warm to touch, negative for any Rashes,  Wounds: Please see nursing documentation  Pressure Injury 12/31/21 Sacrum Right;Left Stage 2 -  Partial thickness loss of dermis presenting as a shallow open  injury with a red, pink wound bed without slough. (Active)  12/31/21 0139  Location: Sacrum  Location Orientation: Right;Left  Staging: Stage 2 -  Partial thickness loss of dermis presenting as a shallow open injury with a red, pink wound bed without slough.  Wound Description (Comments):   Present on Admission: Yes               LABs:  CBC Latest Ref Rng & Units 12/31/2021 12/30/2021 03/17/2021  WBC 4.0 - 10.5 K/uL 9.3 12.4(H) 4.9  Hemoglobin 12.0 - 15.0 g/dL 12.9 15.7(H) 13.6  Hematocrit 36.0 - 46.0 % 41.1 47.3(H) 41.9  Platelets 150 - 400 K/uL 160 185 177   CMP Latest Ref Rng & Units 01/02/2022  01/01/2022 12/31/2021  Glucose 70 - 99 mg/dL 212(H) 207(H) 212(H)  BUN 8 - 23 mg/dL 19 22 28(H)  Creatinine 0.44 - 1.00 mg/dL 0.63 0.63 0.66  Sodium 135 - 145 mmol/L 137 136 135  Potassium 3.5 - 5.1 mmol/L 4.2 3.8 4.0  Chloride 98 - 111 mmol/L 104 104 105  CO2 22 - 32 mmol/L 30 26 21(L)  Calcium 8.9 - 10.3 mg/dL 8.3(L) 8.2(L) 8.4(L)  Total Protein 6.5 - 8.1 g/dL - - -  Total Bilirubin 0.3 - 1.2 mg/dL - - -  Alkaline Phos 38 - 126 U/L - - -  AST 15 - 41 U/L - - -  ALT 0 - 44 U/L - - -       Micro Results Recent Results (from the past 240 hour(s))  Resp Panel by RT-PCR (Flu A&B, Covid) Nasopharyngeal Swab     Status: None   Collection Time: 12/30/21  5:54 PM   Specimen: Nasopharyngeal Swab; Nasopharyngeal(NP) swabs in vial transport medium  Result Value Ref Range Status   SARS Coronavirus 2 by RT PCR NEGATIVE NEGATIVE Final    Comment: (NOTE) SARS-CoV-2 target nucleic acids are NOT DETECTED.  The SARS-CoV-2 RNA is generally detectable in upper respiratory specimens during the acute phase of infection. The lowest concentration of SARS-CoV-2 viral copies this assay can detect is 138 copies/mL. A negative result does not preclude SARS-Cov-2 infection and should not be used as the sole basis for treatment or other patient management decisions. A negative result may occur with   improper specimen collection/handling, submission of specimen other than nasopharyngeal swab, presence of viral mutation(s) within the areas targeted by this assay, and inadequate number of viral copies(<138 copies/mL). A negative result must be combined with clinical observations, patient history, and epidemiological information. The expected result is Negative.  Fact Sheet for Patients:  EntrepreneurPulse.com.au  Fact Sheet for Healthcare Providers:  IncredibleEmployment.be  This test is no t yet approved or cleared by the [place] FDA and  has been authorized for detection and/or diagnosis of SARS-CoV-2 by FDA under an Emergency Use Authorization (EUA). This EUA will remain  in effect (meaning this test can be used) for the duration of the COVID-19 declaration under Section 564(b)(1) of the Act, 21 U.S.C.section 360bbb-3(b)(1), unless the authorization is terminated  or revoked sooner.       Influenza A by PCR NEGATIVE NEGATIVE Final   Influenza B by PCR NEGATIVE NEGATIVE Final    Comment: (NOTE) The Xpert Xpress SARS-CoV-2/FLU/RSV plus assay is intended as an aid in the diagnosis of influenza from Nasopharyngeal swab specimens and should not be used as a sole basis for treatment. Nasal washings and aspirates are unacceptable for Xpert Xpress SARS-CoV-2/FLU/RSV testing.  Fact Sheet for Patients: EntrepreneurPulse.com.au  Fact Sheet for Healthcare Providers: IncredibleEmployment.be  This test is not yet approved or cleared by the [place] FDA and has been authorized for detection and/or diagnosis of SARS-CoV-2 by FDA under an Emergency Use Authorization (EUA). This EUA will remain in effect (meaning this test can be used) for the duration of the COVID-19 declaration under Section 564(b)(1) of the Act, 21 U.S.C. section 360bbb-3(b)(1), unless the authorization is terminated  or revoked.  Performed at [facility], [address] [place], [place] [address]     Radiology Reports No results found.  SIGNED: Deatra James, MD, FHM. [facility],  Pager (please use [email or web address] to page/text) Please use Epic Secure Chat for non-urgent communication (7AM-7PM)  If 7PM-7AM, please contact  night-coverage [email or web address], 01/02/2022, 12:35 PM

## 2022-01-03 LAB — BASIC METABOLIC PANEL
Anion gap: 5 (ref 5–15)
BUN: 12 mg/dL (ref 8–23)
CO2: 32 mmol/L (ref 22–32)
Calcium: 8.3 mg/dL — ABNORMAL LOW (ref 8.9–10.3)
Chloride: 100 mmol/L (ref 98–111)
Creatinine, Ser: 0.51 mg/dL (ref 0.44–1.00)
GFR, Estimated: >60 mL/min (ref >60)
Glucose, Bld: 182 mg/dL — ABNORMAL HIGH (ref 70–99)
Potassium: 4 mmol/L (ref 3.5–5.1)
Sodium: 137 mmol/L (ref 135–145)

## 2022-01-03 LAB — GLUCOSE, CAPILLARY
Glucose-Capillary: 201 mg/dL — ABNORMAL HIGH (ref 70–99)
Glucose-Capillary: 239 mg/dL — ABNORMAL HIGH (ref 70–99)

## 2022-01-03 LAB — CK: Total CK: 168 U/L (ref 38–234)

## 2022-01-03 MED ORDER — ACETAMINOPHEN 325 MG PO TABS: 650.0000 mg | ORAL_TABLET | Freq: Four times a day (QID) | ORAL | 0 refills | Status: AC | PRN

## 2022-01-03 MED ORDER — IBUPROFEN 600 MG PO TABS: 600.0000 mg | ORAL_TABLET | Freq: Four times a day (QID) | ORAL | 0 refills | Status: DC | PRN

## 2022-01-03 MED ORDER — HYDROCODONE-ACETAMINOPHEN 5-325 MG PO TABS: 1.0000 | ORAL_TABLET | ORAL | 0 refills | Status: AC | PRN

## 2022-01-03 NOTE — TOC Transition Note (Signed)
Transition of Care (TOC) - CM/SW Discharge Note ? ? ?Patient Details  ?Name: Candice Ray ?MRN: [id number] ?Date of Birth: [birth year] ? ?Transition of Care (TOC) CM/SW Contact:  ?Salome Arnt, LCSW ?Phone Number: ?01/03/2022, 3:39 PM ? ? ?Clinical Narrative: Authorization received. Pt will go to [facility] today and will transfer with staff. D/C summary sent to SNF. Pt's daughter-in-law, Opal Sidles updated. RN given number to call report.    ? ? ? ?Final next level of care: [facility] ?Barriers to Discharge: Barriers Resolved ? ? ?Patient Goals and CMS Choice ?Patient states their goals for this hospitalization and ongoing recovery are:: Go to SNF ?CMS Medicare.gov Compare Post Acute Care list provided to:: Patient ?Choice offered to / list presented to : Patient ? ?Discharge Placement ?  ?Existing PASRR number confirmed : 01/03/22          ?Patient chooses bed at: [facility] ?Patient to be transferred to facility by: staff ?Name of family member notified: Opal Sidles, daughter-in-law ?Patient and family notified of of transfer: 01/03/22 ? ?Discharge Plan and Services ?In-house Referral: Clinical Social Work ?Discharge Planning Services: CM Consult ?Post Acute Care Choice: [facility]          ?  ?  ?  ?  ?  ?  ?  ?  ?  ?  ? ?Social Determinants of Health (SDOH) Interventions ?  ? ? ?Readmission Risk Interventions ?No flowsheet data found. ? ? ? ? ?

## 2022-01-03 NOTE — Progress Notes (Signed)
PROGRESS NOTE    Patient: Candice Ray                            PCP: Jani Gravel, MD                    DOB: 1932/01/11            DOA: 12/30/2021 [id number]             DOS: 01/03/2022, 11:24 AM   LOS: 2 days   Date of Service: The patient was seen and examined on 01/03/2022  Subjective:   The patient was seen and examined this morning, stable no acute distress Complains of hips and pelvic pain with any exertion  Currently comfortable in bed  Brief Narrative:   Candice Ray  is a 88 y.o. female, with past medical history of diabetes mellitus, dementia, patient lives at home by herself with close family support, last time patient was seen on Monday by family, she is extremely hard of hearing, history was given by the daughter, patient was last seen by Monday, apparently she had a fall by Tuesday, family found her today, she was more confused than her baseline, patient is complaining of left upper leg pain, patient denies any dizziness, lightheadedness or loss of consciousness, per daughter mother refuses to use walker at home despite having 2 walkers. -In ED work-up significant for pelvic fracture, no hip fracture, labs significant for total CK elevated at 2000, but creatinine within normal limit, UA still pending, CT head and cervical spine with no acute findings.     Assessment & Plan:   Principal Problem:   Pelvic fracture (HCC) Active Problems:   Rhabdomyolysis   Diabetes mellitus ([place])   Dementia without behavioral disturbance (HCC)   Pressure injury of skin     Assessment and Plan:  Pelvic  fracture due to fall Stable tolerating pain medications -Continue to complain of pain with any exertion even in bed Otherwise hemodynamically stable  -Pain management --titrating pain patient's -Orthopedic Dr. Aline Brochure consulted Reviewed images, confirmed left superior and inferior pubic ramus fracture, recommending no intervention ... Continue with medical  management  -Due to unsteady gait and poor balance, not associated with syncope or any neurological deficits -Consulting PT/OT for evaluation --recommending SNF   Rhabdomyolysis -Total CK elevated at 2000 >> 964, 566 >> 249 >> 168 -Monitoring CK closely, creatinine closely -Continue IV fluid resuscitation --reducing IV fluids rate--we will discontinue today -Avoiding nephrotoxins -Creatinine stable 0.66, 0.63, 0.63, 0.51   Dementia -High risk for delirium, continue with delirium precaution -Mentation at baseline   Diabetes mellitus -Hold Ozempic  -Checking CBG QA CHS, SSI coverage   Ethics: DNR/DNI   Nutritional status:  The patient's BMI is: Body mass index is 23.95 kg/m. I agree with the assessment and plan as outlined    Skin Assessment: I have examined the patient's skin and I agree with the wound assessment as performed by wound care team As outlined belowe:  Pressure Injury 12/31/21 Sacrum Right;Left Stage 2 -  Partial thickness loss of dermis presenting as a shallow open injury with a red, pink wound bed without slough. (Active)  12/31/21 0139  Location: Sacrum  Location Orientation: Right;Left  Staging: Stage 2 -  Partial thickness loss of dermis presenting as a shallow open injury with a red, pink wound bed without slough.  Wound Description (Comments):   Present on Admission: Yes   --------------------------------------------------------------------------------------------  DVT prophylaxis:  heparin injection 5,000 Units Start: 12/31/21 0600 SCDs Start: 12/31/21 0047   Code Status:   Code Status: DNR  Family Communication:  Findings and plan of care was discussed with the patient daughter at bedside on admission  No family member present at bedside this a.m. -  attempt will be made to update daily   Admission status:   Status is: Inpatient Continue to remain in hospital pending approval for SNF,   Patient is medically stable and ready for  discharge    Disposition: from home Discharge--medically ready to be discharged to SNF once approved        Procedures:   No admission procedures for hospital encounter.   Antimicrobials:  Anti-infectives (From admission, onward)    None        Medication:   aspirin EC  81 mg Oral Daily   heparin  5,000 Units Subcutaneous Q8H   insulin aspart  0-5 Units Subcutaneous QHS   insulin aspart  0-9 Units Subcutaneous TID WC    acetaminophen **OR** acetaminophen, HYDROcodone-acetaminophen, ibuprofen, methocarbamol (ROBAXIN) IV, traZODone   Objective:   Vitals:   01/02/22 0457 01/02/22 1453 01/02/22 2121 01/03/22 0400  BP: 137/65 121/62 (!) 117/94 (!) 141/79  Pulse: 84 86 79 95  Resp: '16 18 18 14  '$ Temp: 98.3 F (36.8 C) 98.4 F (36.9 C) 98.3 F (36.8 C) (!) 97.5 F (36.4 C)  TempSrc:  Oral Oral Oral  SpO2: 97% 96% 94% 96%  Weight:      Height:        Intake/Output Summary (Last 24 hours) at 01/03/2022 1124 Last data filed at 01/03/2022 0900 Gross per 24 hour  Intake 480 ml  Output 2150 ml  Net -1670 ml   Filed Weights   12/30/21 1515 12/30/21 1529 12/31/21 0119  Weight: 63.5 kg 59 kg 59.4 kg     Examination:        Physical Exam:   General:  AAO x 3,  cooperative, no distress;   HEENT:  Normocephalic, PERRL, otherwise with in Normal limits   Neuro:  CNII-XII intact. , normal motor and sensation, reflexes intact   Lungs:   Clear to auscultation BL, Respirations unlabored,  No wheezes / crackles  Cardio:    S1/S2, RRR, No murmure, No Rubs or Gallops   Abdomen:  Soft, non-tender, bowel sounds active all four quadrants, no guarding or peritoneal signs.  Muscular  skeletal:  Limited exam -global generalized weaknesses - in bed, able to move all 4 extremities,   2+ pulses,  symmetric, No pitting edema  Skin:  Dry, warm to touch, negative for any Rashes,  Wounds: Please see nursing documentation  Pressure Injury 12/31/21 Sacrum Right;Left Stage  2 -  Partial thickness loss of dermis presenting as a shallow open injury with a red, pink wound bed without slough. (Active)  12/31/21 0139  Location: Sacrum  Location Orientation: Right;Left  Staging: Stage 2 -  Partial thickness loss of dermis presenting as a shallow open injury with a red, pink wound bed without slough.  Wound Description (Comments):   Present on Admission: Yes                 LABs:  CBC Latest Ref Rng & Units 12/31/2021 12/30/2021 03/17/2021  WBC 4.0 - 10.5 K/uL 9.3 12.4(H) 4.9  Hemoglobin 12.0 - 15.0 g/dL 12.9 15.7(H) 13.6  Hematocrit 36.0 - 46.0 % 41.1 47.3(H) 41.9  Platelets 150 - 400 K/uL 160 185 177  CMP Latest Ref Rng & Units 01/03/2022 01/02/2022 01/01/2022  Glucose 70 - 99 mg/dL 182(H) 212(H) 207(H)  BUN 8 - 23 mg/dL '12 19 22  '$ Creatinine 0.44 - 1.00 mg/dL 0.51 0.63 0.63  Sodium 135 - 145 mmol/L 137 137 136  Potassium 3.5 - 5.1 mmol/L 4.0 4.2 3.8  Chloride 98 - 111 mmol/L 100 104 104  CO2 22 - 32 mmol/L 32 30 26  Calcium 8.9 - 10.3 mg/dL 8.3(L) 8.3(L) 8.2(L)  Total Protein 6.5 - 8.1 g/dL - - -  Total Bilirubin 0.3 - 1.2 mg/dL - - -  Alkaline Phos 38 - 126 U/L - - -  AST 15 - 41 U/L - - -  ALT 0 - 44 U/L - - -       Micro Results Recent Results (from the past 240 hour(s))  Resp Panel by RT-PCR (Flu A&B, Covid) Nasopharyngeal Swab     Status: None   Collection Time: 12/30/21  5:54 PM   Specimen: Nasopharyngeal Swab; Nasopharyngeal(NP) swabs in vial transport medium  Result Value Ref Range Status   SARS Coronavirus 2 by RT PCR NEGATIVE NEGATIVE Final    Comment: (NOTE) SARS-CoV-2 target nucleic acids are NOT DETECTED.  The SARS-CoV-2 RNA is generally detectable in upper respiratory specimens during the acute phase of infection. The lowest concentration of SARS-CoV-2 viral copies this assay can detect is 138 copies/mL. A negative result does not preclude SARS-Cov-2 infection and should not be used as the sole basis for treatment or other  patient management decisions. A negative result may occur with  improper specimen collection/handling, submission of specimen other than nasopharyngeal swab, presence of viral mutation(s) within the areas targeted by this assay, and inadequate number of viral copies(<138 copies/mL). A negative result must be combined with clinical observations, patient history, and epidemiological information. The expected result is Negative.  Fact Sheet for Patients:  EntrepreneurPulse.com.au  Fact Sheet for Healthcare Providers:  IncredibleEmployment.be  This test is no t yet approved or cleared by the [place] FDA and  has been authorized for detection and/or diagnosis of SARS-CoV-2 by FDA under an Emergency Use Authorization (EUA). This EUA will remain  in effect (meaning this test can be used) for the duration of the COVID-19 declaration under Section 564(b)(1) of the Act, 21 U.S.C.section 360bbb-3(b)(1), unless the authorization is terminated  or revoked sooner.       Influenza A by PCR NEGATIVE NEGATIVE Final   Influenza B by PCR NEGATIVE NEGATIVE Final    Comment: (NOTE) The Xpert Xpress SARS-CoV-2/FLU/RSV plus assay is intended as an aid in the diagnosis of influenza from Nasopharyngeal swab specimens and should not be used as a sole basis for treatment. Nasal washings and aspirates are unacceptable for Xpert Xpress SARS-CoV-2/FLU/RSV testing.  Fact Sheet for Patients: EntrepreneurPulse.com.au  Fact Sheet for Healthcare Providers: IncredibleEmployment.be  This test is not yet approved or cleared by the [place] FDA and has been authorized for detection and/or diagnosis of SARS-CoV-2 by FDA under an Emergency Use Authorization (EUA). This EUA will remain in effect (meaning this test can be used) for the duration of the COVID-19 declaration under Section 564(b)(1) of the Act, 21 U.S.C. section  360bbb-3(b)(1), unless the authorization is terminated or revoked.  Performed at [facility], [address], [place], [place] [address]     Radiology Reports No results found.  SIGNED: Deatra James, MD, FHM. [facility],  Pager (please use [email or web address] to page/text) Please use Epic Secure Chat for non-urgent  communication (7AM-7PM)  If 7PM-7AM, please contact night-coverage [email or web address], 01/03/2022, 11:24 AM

## 2022-01-03 NOTE — Discharge Summary (Signed)
Physician Discharge Summary   Patient: Candice Ray MRN: [id number] DOB: Jan 22, 1932  Admit date:     12/30/2021  Discharge date: 01/03/22  Discharge Physician: Deatra James   PCP: Jani Gravel, MD   Recommendations at discharge:   Follow-up with a PCP in next 3-5 days Fall precautions PT/OT  Follow-up with outpatient palliative care team  Discharge Diagnoses: Principal Problem:   Pelvic fracture ([place]) Active Problems:   Rhabdomyolysis   Diabetes mellitus ([place])   Dementia without behavioral disturbance (HCC)   Pressure injury of skin  Resolved Problems:   * No resolved hospital problems. *  Hospital Course: Candice Ray  is a 88 y.o. female, with past medical history of diabetes mellitus, dementia, patient lives at home by herself with close family support, last time patient was seen on Monday by family, she is extremely hard of hearing, history was given by the daughter, patient was last seen by Monday, apparently she had a fall by Tuesday, family found her today, she was more confused than her baseline, patient is complaining of left upper leg pain, patient denies any dizziness, lightheadedness or loss of consciousness, per daughter mother refuses to use walker at home despite having 2 walkers. -In ED work-up significant for pelvic fracture, no hip fracture, labs significant for total CK elevated at 2000, but creatinine within normal limit, UA still pending, CT head and cervical spine with no acute findings.   Assessment and Plan:  Pelvic  fracture due to fall Stable tolerating pain medications -Continue to complain of pain with any exertion even in bed Otherwise hemodynamically stable   -Pain management --titrating pain patient's -Orthopedic Dr. Aline Brochure consulted He Reviewed images, confirmed left superior and inferior pubic ramus fracture, recommending no intervention ... Continue with medical management   -Due to unsteady gait and poor balance, not associated with  syncope or any neurological deficits -Consulting PT/OT for evaluation --recommending SNF   Rhabdomyolysis -Total CK elevated at 2000 >> 964, 566 >> 249 >> 168 -Monitoring CK closely, creatinine closely -Continue IV fluid resuscitation --reducing IV fluids rate--D/ced bow  -Avoiding nephrotoxins -Creatinine stable 0.66, 0.63, 0.63, 0.51     Dementia -High risk for delirium, continue with delirium precaution -Mentation at baseline   Diabetes mellitus -Resume Ozempic  -Checking CBG QA CHS, SSI coverage     Ethics: DNR/DNI     Nutritional status:  The patient's BMI is: Body mass index is 23.95 kg/m. I agree with the assessment and plan as outlined      Skin Assessment: I have examined the patient's skin and I agree with the wound assessment as performed by wound care team As outlined belowe:   Pressure Injury 12/31/21 Sacrum Right;Left Stage 2 -  Partial thickness loss of dermis presenting as a shallow open injury with a red, pink wound bed without slough. (Active)  12/31/21 0139  Location: Sacrum  Location Orientation: Right;Left  Staging: Stage 2 -  Partial thickness loss of dermis presenting as a shallow open injury with a red, pink wound bed without slough.  Wound Description (Comments):   Present on Admission: Yes    --------------------------------------------------------------------------------------------     DVT prophylaxis:  Continue with SCDs Start: 12/31/21 0047     Code Status:   Code Status: DNR   Family Communication:  Findings and plan of care was discussed with the patient daughter at bedside on admission      Pain control - [place] was reviewed. and patient  was instructed, not to drive, operate heavy machinery, perform activities at heights, swimming or participation in water activities or provide baby-sitting services while on Pain, Sleep and Anxiety Medications; until their outpatient  Physician has advised to do so again. Also recommended to not to take more than prescribed Pain, Sleep and Anxiety Medications.  Consultants: Orthopedic team Dr. Aline Brochure Procedures performed: None Disposition: Skilled nursing facility Diet recommendation:  Discharge Diet Orders (From admission, onward)     Start     Ordered   01/03/22 0000  Diet - low sodium heart healthy        01/03/22 1530           Regular diet DISCHARGE MEDICATION: Allergies as of 01/03/2022       Reactions   Contrast Media [iodinated Contrast Media] Swelling   Metrizamide Swelling        Medication List     STOP taking these medications    Ozempic (0.25 or 0.5 MG/DOSE) 2 MG/1.5ML Sopn Generic drug: Semaglutide(0.25 or 0.'5MG'$ /DOS)       TAKE these medications    acetaminophen 325 MG tablet Commonly known as: TYLENOL Take 2 tablets (650 mg total) by mouth every 6 (six) hours as needed for mild pain (or Fever >/= 101).   aspirin EC 81 MG tablet Take 81 mg by mouth daily.   CALTRATE 600+D PO Take 1 tablet by mouth 2 (two) times daily.   HYDROcodone-acetaminophen 5-325 MG tablet Commonly known as: NORCO/VICODIN Take 1 tablet by mouth every 4 (four) hours as needed for up to 3 days for moderate pain.   ibuprofen 600 MG tablet Commonly known as: ADVIL Take 1 tablet (600 mg total) by mouth every 6 (six) hours as needed for mild pain.   multivitamin with minerals Tabs tablet Take 1 tablet by mouth daily.   Synjardy 12.02-999 MG Tabs Generic drug: Empagliflozin-metFORMIN HCl Take 1 tablet by mouth 2 (two) times daily.               Discharge Care Instructions  (From admission, onward)           Start     Ordered   01/03/22 0000  Discharge wound care:       Comments: Per wound care instructions continue dressing sacrum right and left stage II partial thickness loss of the dermis Patient may moved in bed every 2 hours....   01/03/22 1530            Contact information  for after-discharge care     [facility] .   Service: Skilled Nursing Contact information: [address] [place] [phone]                    Discharge Exam: Candice Ray Weights   12/30/21 1515 12/30/21 1529 12/31/21 0119  Weight: 63.5 kg 59 kg 59.4 kg     Physical Exam:   General:  AAO x 3,  cooperative, no distress;   HEENT:  Normocephalic, PERRL, otherwise with in Normal limits   Neuro:  CNII-XII intact. , normal motor and sensation, reflexes intact   Lungs:   Clear to auscultation BL, Respirations unlabored,  No wheezes / crackles  Cardio:    S1/S2, RRR, No murmure, No Rubs or Gallops   Abdomen:  Soft, non-tender, bowel sounds active all four quadrants, no guarding or peritoneal signs.  Muscular  skeletal:  Limited exam -global generalized weaknesses - in  bed, able to move all 4 extremities,   2+ pulses,  symmetric, No pitting edema  Skin:  Dry, warm to touch, negative for any Rashes,  Wounds: Please see nursing documentation   Pressure Injury 12/31/21 Sacrum Right;Left Stage 2 -  Partial thickness loss of dermis presenting as a shallow open injury with a red, pink wound bed without slough. (Active)  12/31/21 0139  Location: Sacrum  Location Orientation: Right;Left  Staging: Stage 2 -  Partial thickness loss of dermis presenting as a shallow open injury with a red, pink wound bed without slough.  Wound Description (Comments):   Present on Admission: Yes            Condition at discharge: stable  The results of significant diagnostics from this hospitalization (including imaging, microbiology, ancillary and laboratory) are listed below for reference.   Imaging Studies: DG Chest 2 View  Result Date: 12/30/2021 CLINICAL DATA:  Fall EXAM: CHEST - 2 VIEW COMPARISON:  02/06/2018 FINDINGS: Cardiomegaly. Aortic atherosclerosis. No confluent opacities, effusions or edema. No acute bony  abnormality. IMPRESSION: Mild cardiomegaly.  No active disease. Electronically Signed   By: Rolm Baptise M.D.   On: 12/30/2021 17:30   CT Head Wo Contrast  Result Date: 12/30/2021 CLINICAL DATA:  Head trauma, intracranial arterial injury suspected. Fall. EXAM: CT HEAD WITHOUT CONTRAST TECHNIQUE: Contiguous axial images were obtained from the base of the skull through the vertex without intravenous contrast. RADIATION DOSE REDUCTION: This exam was performed according to the departmental dose-optimization program which includes automated exposure control, adjustment of the mA and/or kV according to patient size and/or use of iterative reconstruction technique. COMPARISON:  03/03/2020 FINDINGS: Brain: There is atrophy and chronic small vessel disease changes. No acute intracranial abnormality. Specifically, no hemorrhage, hydrocephalus, mass lesion, acute infarction, or significant intracranial injury. Vascular: No hyperdense vessel or unexpected calcification. Skull: No acute calvarial abnormality. Sinuses/Orbits: No acute findings Other: None IMPRESSION: Atrophy, chronic microvascular disease. No acute intracranial abnormality. Electronically Signed   By: Rolm Baptise M.D.   On: 12/30/2021 17:17   CT Cervical Spine Wo Contrast  Result Date: 12/30/2021 CLINICAL DATA:  Neck trauma (Age >= 65y).  Fall EXAM: CT CERVICAL SPINE WITHOUT CONTRAST TECHNIQUE: Multidetector CT imaging of the cervical spine was performed without intravenous contrast. Multiplanar CT image reconstructions were also generated. RADIATION DOSE REDUCTION: This exam was performed according to the departmental dose-optimization program which includes automated exposure control, adjustment of the mA and/or kV according to patient size and/or use of iterative reconstruction technique. COMPARISON:  03/03/2020 FINDINGS: Alignment: Normal Skull base and vertebrae: No acute fracture. No primary bone lesion or focal pathologic process. Soft tissues and  spinal canal: No prevertebral fluid or swelling. No visible canal hematoma. Disc levels: Moderate diffuse degenerative disc disease and facet disease bilaterally, left greater than right. Upper chest: No acute findings Other: 8 mm low-density nodule in the right thyroid lobe. Not clinically significant; no follow-up imaging recommended (ref: J Am Coll Radiol. 2015 Feb;12(2): 143-50). IMPRESSION: Degenerative changes in the cervical spine. No acute bony abnormality. Electronically Signed   By: Rolm Baptise M.D.   On: 12/30/2021 17:18   DG Hip Unilat W or Wo Pelvis 2-3 Views Left  Result Date: 12/30/2021 CLINICAL DATA:  Fall EXAM: DG HIP (WITH OR WITHOUT PELVIS) 2-3V LEFT COMPARISON:  07/27/2012 FINDINGS: Fractures are noted through the left superior and inferior pubic rami. Mild displacement of the superior pubic ramus fracture. No fracture in the proximal left femur. No subluxation  or dislocation. IMPRESSION: Left superior and inferior pubic rami fractures. Electronically Signed   By: Rolm Baptise M.D.   On: 12/30/2021 17:29    Microbiology: Results for orders placed or performed during the hospital encounter of 12/30/21  Resp Panel by RT-PCR (Flu A&B, Covid) Nasopharyngeal Swab     Status: None   Collection Time: 12/30/21  5:54 PM   Specimen: Nasopharyngeal Swab; Nasopharyngeal(NP) swabs in vial transport medium  Result Value Ref Range Status   SARS Coronavirus 2 by RT PCR NEGATIVE NEGATIVE Final    Comment: (NOTE) SARS-CoV-2 target nucleic acids are NOT DETECTED.  The SARS-CoV-2 RNA is generally detectable in upper respiratory specimens during the acute phase of infection. The lowest concentration of SARS-CoV-2 viral copies this assay can detect is 138 copies/mL. A negative result does not preclude SARS-Cov-2 infection and should not be used as the sole basis for treatment or other patient management decisions. A negative result may occur with  improper specimen collection/handling, submission  of specimen other than nasopharyngeal swab, presence of viral mutation(s) within the areas targeted by this assay, and inadequate number of viral copies(<138 copies/mL). A negative result must be combined with clinical observations, patient history, and epidemiological information. The expected result is Negative.  Fact Sheet for Patients:  EntrepreneurPulse.com.au  Fact Sheet for Healthcare Providers:  IncredibleEmployment.be  This test is no t yet approved or cleared by the [place] FDA and  has been authorized for detection and/or diagnosis of SARS-CoV-2 by FDA under an Emergency Use Authorization (EUA). This EUA will remain  in effect (meaning this test can be used) for the duration of the COVID-19 declaration under Section 564(b)(1) of the Act, 21 U.S.C.section 360bbb-3(b)(1), unless the authorization is terminated  or revoked sooner.       Influenza A by PCR NEGATIVE NEGATIVE Final   Influenza B by PCR NEGATIVE NEGATIVE Final    Comment: (NOTE) The Xpert Xpress SARS-CoV-2/FLU/RSV plus assay is intended as an aid in the diagnosis of influenza from Nasopharyngeal swab specimens and should not be used as a sole basis for treatment. Nasal washings and aspirates are unacceptable for Xpert Xpress SARS-CoV-2/FLU/RSV testing.  Fact Sheet for Patients: EntrepreneurPulse.com.au  Fact Sheet for Healthcare Providers: IncredibleEmployment.be  This test is not yet approved or cleared by the [place] FDA and has been authorized for detection and/or diagnosis of SARS-CoV-2 by FDA under an Emergency Use Authorization (EUA). This EUA will remain in effect (meaning this test can be used) for the duration of the COVID-19 declaration under Section 564(b)(1) of the Act, 21 U.S.C. section 360bbb-3(b)(1), unless the authorization is terminated or revoked.  Performed at [facility], [address],  [place], [place] [address]     Labs: CBC: Recent Labs  Lab 12/30/21 1743 12/31/21 0538  WBC 12.4* 9.3  NEUTROABS 10.2*  --   HGB 15.7* 12.9  HCT 47.3* 41.1  MCV 94.0 94.5  PLT 185 916   Basic Metabolic Panel: Recent Labs  Lab 12/30/21 1743 12/31/21 0538 01/01/22 0506 01/02/22 0450 01/03/22 0454  NA 137 135 136 137 137  K 4.1 4.0 3.8 4.2 4.0  CL 100 105 104 104 100  CO2 19* 21* 26 30 32  GLUCOSE 181* 212* 207* 212* 182*  BUN 30* 28* '22 19 12  '$ CREATININE 0.81 0.66 0.63 0.63 0.51  CALCIUM 9.3 8.4* 8.2* 8.3* 8.3*   Liver Function Tests: Recent Labs  Lab 12/30/21 1743  AST 122*  ALT 43  ALKPHOS 52  BILITOT 2.5*  PROT 7.5  ALBUMIN 4.5   CBG: Recent Labs  Lab 01/02/22 1117 01/02/22 1623 01/02/22 2116 01/03/22 0737 01/03/22 1144  GLUCAP 295* 219* 205* 201* 239*    Discharge time spent: greater than 30 minutes.  Signed: Deatra James, MD [facility] 01/03/2022

## 2022-01-03 NOTE — Progress Notes (Signed)
Inpatient Diabetes Program Recommendations ? ?AACE/ADA: New Consensus Statement on Inpatient Glycemic Control  ? ?Target Ranges:  Prepandial:   less than 140 mg/dL ?     Peak postprandial:   less than 180 mg/dL (1-2 hours) ?     Critically ill patients:  140 - 180 mg/dL  ? ? Latest Reference Range & Units 01/02/22 07:34 01/02/22 11:17 01/02/22 16:23 01/02/22 21:16 01/03/22 07:37  ?Glucose-Capillary 70 - 99 mg/dL 197 (H) 295 (H) 219 (H) 205 (H) 201 (H)  ? ?Review of Glycemic Control ? ?Diabetes history: DM2 ?Outpatient Diabetes medications: Synjardy 12.02-999 mg BID, Ozempic ?Current orders for Inpatient glycemic control: Novolog 0-9 units TID with meals, Novolog 0-5 units QHS ? ?Inpatient Diabetes Program Recommendations:   ? ?Insulin: Please consider ordering Novolog 3 units TID with meals for meal coverage if patient eats at least 50% of meals. ? ?Thanks, ?Barnie Alderman, RN, MSN, CDE ?Diabetes Coordinator ?Inpatient Diabetes Program ?[phone] (Team Pager from 8am to 5pm) ? ? ? ?

## 2022-01-03 NOTE — Care Management Important Message (Signed)
Important Message ? ?Patient Details  ?Name: Candice Ray ?MRN: [id number] ?Date of Birth: 1932-08-29 ? ? ?Medicare Important Message Given:  Yes ? ? ? ? ?Tommy Medal ?01/03/2022, 4:52 PM ?

## 2022-01-03 NOTE — Evaluation (Signed)
Occupational Therapy Evaluation ?Patient Details ?Name: Candice Ray ?MRN: [id number] ?DOB: Aug 05, 1932 ?Today's Date: 01/03/2022 ? ? ?History of Present Illness Candice Ray  is a 88 y.o. female, with past medical history of diabetes mellitus, dementia, patient lives at home by herself with close family support, last time patient was seen on Monday by family, she is extremely hard of hearing, history was given by the daughter, patient was last seen by Monday, apparently she had a fall by Tuesday, family found her today, she was more confused than her baseline, patient is complaining of left upper leg pain, patient denies any dizziness, lightheadedness or loss of consciousness, per daughter mother refuses to use walker at home despite having 2 walkers.  ? ?Clinical Impression ?  ?Pt in bed upon therapy arrival and agreeable to participate in OT evaluation. Patient required repeated questions and/or prompts during evaluation due to severe HOH. Patient overall required increased time to complete tasks moving at a slow pace. VC were needed for safety and technique during sit to stands and functional transfer. Pt currently is needing increased physical assist to complete ADL tasks and functional transfers. Recommend SNF at discharge. OT will follow patient acutely.   ?   ? ?Recommendations for follow up therapy are one component of a multi-disciplinary discharge planning process, led by the attending physician.  Recommendations may be updated based on patient status, additional functional criteria and insurance authorization.  ? ?Follow Up Recommendations ? Skilled nursing-short term rehab (<3 hours/day)  ?  ?Assistance Recommended at Discharge Frequent or constant Supervision/Assistance  ?Patient can return home with the following A lot of help with walking and/or transfers;A lot of help with bathing/dressing/bathroom;Assistance with cooking/housework;Assist for transportation ? ?  ?   ?Equipment Recommendations ?  BSC/3in1;Tub/shower seat  ?  ?   ?Precautions / Restrictions Precautions ?Precautions: Fall ?Restrictions ?Weight Bearing Restrictions: Yes ?LLE Weight Bearing: Weight bearing as tolerated  ? ?  ? ?Mobility Bed Mobility ?Overal bed mobility: Needs Assistance ?Bed Mobility: Supine to Sit ?  ?  ?Supine to sit: HOB elevated, Min guard ?  ?  ?General bed mobility comments: bed rails used. VC for sequencing if needed. ?  ? ?Transfers ?Overall transfer level: Needs assistance ?Equipment used: Rolling walker (2 wheels) ?Transfers: Sit to/from Stand, Bed to chair/wheelchair/BSC ?Sit to Stand: Mod assist ?  ?  ?Step pivot transfers: Min assist ?  ?  ?General transfer comment: Pulled on RW to stand. Increased time needed to use bilateral arms to fully stand up. VC for hand placement on RW once standing. ?  ? ?  ?Balance Overall balance assessment: Needs assistance ?Sitting-balance support: No upper extremity supported, Feet supported ?Sitting balance-Leahy Scale: Good ?Sitting balance - Comments: seated EOB ?  ?Standing balance support: Bilateral upper extremity supported ?Standing balance-Leahy Scale: Poor ?Standing balance comment: using RW ?  ?  ?   ? ?ADL either performed or assessed with clinical judgement  ? ?ADL Overall ADL's : Needs assistance/impaired ?Eating/Feeding: Set up;Bed level ?  ?Grooming: Wash/dry hands;Wash/dry face;Set up;Sitting ?  ?Upper Body Bathing: Minimal assistance;Sitting ?  ?Lower Body Bathing: Total assistance;Bed level ?  ?Upper Body Dressing : Set up ?  ?Lower Body Dressing: Total assistance;Sit to/from stand;Sitting/lateral leans ?  ?Toilet Transfer: Moderate assistance;BSC/3in1;Rolling walker (2 wheels) ?  ?Toileting- Clothing Manipulation and Hygiene: Total assistance;Sitting/lateral lean;Sit to/from stand ?  ?   ? ? ? ?Vision Baseline Vision/History: 0 No visual deficits ?Patient Visual Report: No change from baseline ?   ?   ?   ?   ? ?  Pertinent Vitals/Pain Pain Assessment ?Pain  Assessment: No/denies pain  ? ? ? ?Hand Dominance Right ?  ?Extremity/Trunk Assessment Upper Extremity Assessment ?Upper Extremity Assessment: Generalized weakness ?  ?Lower Extremity Assessment ?Lower Extremity Assessment: Defer to PT evaluation ?  ?  ?  ?Communication Communication ?Communication: HOH ?  ?Cognition Arousal/Alertness: Awake/alert ?Behavior During Therapy: [facility] for tasks assessed/performed ?Overall Cognitive Status: No family/caregiver present to determine baseline cognitive functioning ?  ?  ?  ?General Comments: Patient HOH and difficult to assess cognition. ?  ?  ?   ?   ?   ? ? ?Home Living Family/patient expects to be discharged to:: Private residence ?Living Arrangements: Alone ?Available Help at Discharge: Family;Available PRN/intermittently ?Type of Home: House ?Home Access: Level entry ?  ?  ?Home Layout: One level ?  ?  ?Bathroom Shower/Tub: Tub/shower unit ?  ?Bathroom Toilet: Standard ?Bathroom Accessibility: Yes ?  ?Home Equipment: Conservation officer, nature (2 wheels);Rollator (4 wheels) ?  ?Additional Comments: Patient does not use AD despite frequent attempts by family ?  ? ?  ?Prior Functioning/Environment Prior Level of Function : Independent/Modified Independent ?  ?  ?  ?Mobility Comments: household ambulation using walls, objects for support ?ADLs Comments: family assists PRN ?  ? ?  ?  ?OT Problem List: Decreased strength;Decreased activity tolerance;Decreased safety awareness;Impaired balance (sitting and/or standing);Decreased knowledge of use of DME or AE ?  ?   ?OT Treatment/Interventions: Self-care/ADL training;Therapeutic exercise;Therapeutic activities;Neuromuscular education;DME and/or AE instruction;Patient/family education;Manual therapy;Balance training;Modalities  ?  ?OT Goals(Current goals can be found in the care plan section) Acute Rehab OT Goals ?Patient Stated Goal: None stated ?OT Goal Formulation: With patient ?Time For Goal Achievement: 01/17/22 ?Potential to Achieve  Goals: Good  ?OT Frequency: Min 2X/week ?  ? ?   ?AM-PAC OT "6 Clicks" Daily Activity     ?Outcome Measure Help from another person eating meals?: A Little ?Help from another person taking care of personal grooming?: A Little ?Help from another person toileting, which includes using toliet, bedpan, or urinal?: A Lot ?Help from another person bathing (including washing, rinsing, drying)?: A Lot ?Help from another person to put on and taking off regular upper body clothing?: A Little ?Help from another person to put on and taking off regular lower body clothing?: A Lot ?6 Click Score: 15 ?  ?End of Session Equipment Utilized During Treatment: Gait belt;Rolling walker (2 wheels) ?Nurse Communication: Other (comment);Mobility status ([place] needs to be replaced) ? ?Activity Tolerance: Patient tolerated treatment well ?Patient left: in chair;with call bell/phone within reach;with chair alarm set ? ?OT Visit Diagnosis: Muscle weakness (generalized) (M62.81);History of falling (Z91.81)  ?              ?Time: 4259-5638 ?OT Time Calculation (min): 27 min ?Charges:  OT General Charges ?$OT Visit: 1 Visit ?OT Evaluation ?$OT Eval Low Complexity: 1 Low ? ?Ailene Ravel, OTR/L,CBIS  [phone] ? ? ?Amerigo Mcglory, Candice Ray ?01/03/2022, 10:18 AM ?

## 2022-01-03 NOTE — Plan of Care (Signed)
?  Problem: Acute Rehab OT Goals (only OT should resolve) ?Goal: Pt. Will Perform Grooming ?Flowsheets (Taken 01/03/2022 1021) ?Pt Will Perform Grooming: ? with modified independence ? sitting ?Goal: Pt. Will Perform Upper Body Bathing ?Flowsheets (Taken 01/03/2022 1021) ?Pt Will Perform Upper Body Bathing: ? with set-up ? sitting ?Goal: Pt. Will Perform Lower Body Bathing ?Flowsheets (Taken 01/03/2022 1021) ?Pt Will Perform Lower Body Bathing: ? with max assist ? sit to/from stand ? sitting/lateral leans ?Goal: Pt. Will Perform Upper Body Dressing ?Flowsheets (Taken 01/03/2022 1021) ?Pt Will Perform Upper Body Dressing: ? with set-up ? sitting ?Goal: Pt. Will Perform Lower Body Dressing ?Flowsheets (Taken 01/03/2022 1021) ?Pt Will Perform Lower Body Dressing: ? with max assist ? sit to/from stand ? sitting/lateral leans ? with adaptive equipment ?Goal: Pt. Will Transfer To Toilet ?Flowsheets (Taken 01/03/2022 1021) ?Pt Will Transfer to Toilet: ? with min guard assist ? ambulating ? bedside commode ?Goal: Pt. Will Perform Toileting-Clothing Manipulation ?Flowsheets (Taken 01/03/2022 1021) ?Pt Will Perform Toileting - Clothing Manipulation and hygiene: ? with min assist ? sit to/from stand ? sitting/lateral leans ?Goal: Pt/Caregiver Will Perform Home Exercise Program ?Flowsheets (Taken 01/03/2022 1021) ?Pt/caregiver will Perform Home Exercise Program: ? Increased strength ? Both right and left upper extremity ? With Supervision ? With written HEP provided ?  ?

## 2022-01-03 NOTE — TOC Progression Note (Addendum)
Transition of Care (TOC) - Progression Note  ? ? ?Patient Details  ?Name: Candice Ray ?MRN: [id number] ?Date of Birth: 07-08-1932 ? ?Transition of Care (TOC) CM/SW Contact  ?Salome Arnt, LCSW ?Phone Number: ?01/03/2022, 11:22 AM ? ?Clinical Narrative:  LCSW presented bed offers to daughter-in-law who chooses [facility]. Facility notified and CMA updating authorization.  ? ?Update: Per [facility], [facility] is not in network. LCSW updated daughter-in-law who chooses [facility]. Ebony Hail at [facility] said they are in network. LCSW called daughter-in-law again with this information, but she wants to stick with [facility] now. CMA updating authorization.  ? ?Expected Discharge Plan: [place] ?Barriers to Discharge: Continued Medical Work up ? ?Expected Discharge Plan and Services ?Expected Discharge Plan: [place] ?In-house Referral: Clinical Social Work ?Discharge Planning Services: CM Consult ?Post Acute Care Choice: [place] ?Living arrangements for the past 2 months: [place] ?                ?  ?  ?  ?  ?  ?  ?  ?  ?  ?  ? ? ?Social Determinants of Health (SDOH) Interventions ?  ? ?Readmission Risk Interventions ?No flowsheet data found. ? ?

## 2022-01-04 ENCOUNTER — Non-Acute Institutional Stay (SKILLED_NURSING_FACILITY): Payer: Medicare Other | Admitting: Adult Health

## 2022-01-04 ENCOUNTER — Encounter: Payer: Self-pay | Admitting: Adult Health

## 2022-01-04 DIAGNOSIS — T796XXD Traumatic ischemia of muscle, subsequent encounter: Secondary | ICD-10-CM

## 2022-01-04 DIAGNOSIS — E1149 Type 2 diabetes mellitus with other diabetic neurological complication: Secondary | ICD-10-CM

## 2022-01-04 DIAGNOSIS — S329XXD Fracture of unspecified parts of lumbosacral spine and pelvis, subsequent encounter for fracture with routine healing: Secondary | ICD-10-CM

## 2022-01-04 DIAGNOSIS — F0390 Unspecified dementia without behavioral disturbance: Secondary | ICD-10-CM

## 2022-01-04 DIAGNOSIS — I700 Atherosclerosis of aorta: Secondary | ICD-10-CM

## 2022-01-04 NOTE — Progress Notes (Signed)
?Location:  [place] ?Nursing Home Room Number: 155-P ?Place of Service:  SNF (31) ? ? ?CODE STATUS: DNR ? ?Allergies  ?Allergen Reactions  ? Contrast Media [Iodinated Contrast Media] Swelling  ? Metrizamide Swelling  ? ? ?Chief Complaint  ?Patient presents with  ? Hospitalization Follow-up  ?  Follow- up from recent hospital stay 12/30/21-01/03/22  ? ? ?HPI: ? ?She is a 88 year old woman who has been hospitalized from 12-30-21 through 01-03-22. Her medical history includes: diabetes; dementia. She had been living at home by herself with family support. She had a fall and remained on the floor for a very extended period of time. She had increased confusion; upper left leg pain. She does not use her walker at home. She was found to have a left pelvic fracture; rhabdomyolysis. She did not develop acute renal failure. She was treated with IVF with resolution of rhabdomyolysis. She is here for short term rehab with her goal to return back home.  She continues to be followed for her chronic illnesses including:   DM type 2 with neurological complications:  Dementia without behavioral disturbance:  Traumatic rhabdomyolysis  Aortic atherosclerosis  ? ?Past Medical History:  ?Diagnosis Date  ? Diabetes mellitus   ? HOH (hard of hearing)   ? Poor historian   ? Sinus trouble   ? ? ?Past Surgical History:  ?Procedure Laterality Date  ? COLONOSCOPY  2007  ? Dr. Gala Romney: tubular adenomas/rectal  ? COLONOSCOPY N/A 01/06/2014  ? CZY:[id number] diverticulosis. Colonic polyp-removed as described above. Status post segmental biopsy and stool collection for culture  ? HERNIA REPAIR    ? umbilical, [place]  ? HYSTEROSCOPY WITH D & C  05/23/2012  ? Procedure: DILATATION AND CURETTAGE /HYSTEROSCOPY;  Surgeon: Florian Buff, MD;  Location: [facility];  Service: Gynecology;  Laterality: N/A;  ? POLYPECTOMY  05/23/2012  ? Procedure: POLYPECTOMY;  Surgeon: Florian Buff, MD;  Location: [facility];  Service: Gynecology;  Laterality: N/A;  endometrial  polypectomy  ? ? ?Social History  ? ?Socioeconomic History  ? Marital status: Married  ?  Spouse name: Not on file  ? Number of children: Not on file  ? Years of education: Not on file  ? Highest education level: Not on file  ?Occupational History  ? Not on file  ?Tobacco Use  ? Smoking status: Never  ? Smokeless tobacco: Never  ?Vaping Use  ? Vaping Use: Never used  ?Substance and Sexual Activity  ? Alcohol use: No  ? Drug use: No  ? Sexual activity: Not on file  ?Other Topics Concern  ? Not on file  ?Social History Narrative  ? Not on file  ? ?Social Determinants of Health  ? ?Financial Resource Strain: Not on file  ?Food Insecurity: Not on file  ?Transportation Needs: Not on file  ?Physical Activity: Not on file  ?Stress: Not on file  ?Social Connections: Not on file  ?Intimate Partner Violence: Not on file  ? ?Family History  ?Problem Relation Age of Onset  ? Colon cancer Neg Hx   ? ? ? ? ?VITAL SIGNS ?BP 129/66   Pulse (!) 52   Temp (!) 97.1 ?F (36.2 ?C)   Resp 20   Ht '5\' 2"'$  (1.575 m)   Wt 130 lb (59 kg)   SpO2 96%   BMI 23.78 kg/m?  ? ?Outpatient Encounter Medications as of 01/04/2022  ?Medication Sig  ? acetaminophen (TYLENOL) 325 MG tablet Take 2 tablets (650 mg  total) by mouth every 6 (six) hours as needed for mild pain (or Fever >/= 101).  ? aspirin EC 81 MG tablet Take 81 mg by mouth daily.  ? Calcium Carbonate-Vitamin D (CALTRATE 600+D PO) Take 1 tablet by mouth 2 (two) times daily.  ? HYDROcodone-acetaminophen (NORCO/VICODIN) 5-325 MG tablet Take 1 tablet by mouth every 4 (four) hours as needed for up to 3 days for moderate pain.  ? ibuprofen (ADVIL) 600 MG tablet Take 1 tablet (600 mg total) by mouth every 6 (six) hours as needed for mild pain.  ? Multiple Vitamin (MULTIVITAMIN WITH MINERALS) TABS Take 1 tablet by mouth daily.  ? SYNJARDY 12.02-999 MG TABS Take 1 tablet by mouth 2 (two) times daily.  ? ?No facility-administered encounter medications on file as of 01/04/2022.  ? ? ? ?SIGNIFICANT  DIAGNOSTIC EXAMS ? ?TODAY ? ?12-30-21: chest x-ray: Mild cardiomegaly. No active disease  ? ?12-30-21: pelvic x-ray: Left superior and inferior pubic rami fractures  ? ?12-30-21: ct of cervical spine: Degenerative changes in the cervical spine. No acute bony abnormality. ? ?12-30-21: ct of head: Atrophy, chronic microvascular disease. No acute intracranial abnormality. ? ?LABS REVIEWED;  ? ?12-30-21: wbc 12.4; hgb 15.7; hct 47.3; mcv 94.0 plt 185; glucose 181; bun 30; creat 081; k+ 4.1; na++ 137; ca 9.3; GFR>60 ast 122; total bili 2.5; protein 7.5; albumin 4.5; CK 2062; hgb a1c 8.4 ?01-01-22: glucose 207; bun 22; creat 0.63; k+ 3.8; na++ 136; ca 8.2; GFR>60 CK 566 ?01-03-22: glucose 182; bun 12; creat 0.61; k+ 4.0; na++ 137; ca 8.3 GFR >60 ? ?Review of Systems  ?Constitutional:  Negative for malaise/fatigue.  ?Respiratory:  Negative for cough and shortness of breath.   ?Cardiovascular:  Negative for chest pain, palpitations and leg swelling.  ?Gastrointestinal:  Negative for abdominal pain, constipation and heartburn.  ?Musculoskeletal:  Negative for back pain, joint pain and myalgias.  ?Skin: Negative.   ?Neurological:  Negative for dizziness.  ?Psychiatric/Behavioral:  The patient is not nervous/anxious.   ? ?Physical Exam ?Constitutional:   ?   General: She is not in acute distress. ?   Appearance: She is well-developed. She is not diaphoretic.  ?HENT:  ?   Ears:  ?   Comments: HOH ?Neck:  ?   Thyroid: No thyromegaly.  ?Cardiovascular:  ?   Rate and Rhythm: Normal rate and regular rhythm.  ?   Pulses: Normal pulses.  ?   Heart sounds: Normal heart sounds.  ?Pulmonary:  ?   Effort: Pulmonary effort is normal. No respiratory distress.  ?   Breath sounds: Normal breath sounds.  ?Abdominal:  ?   General: Bowel sounds are normal. There is no distension.  ?   Palpations: Abdomen is soft.  ?   Tenderness: There is no abdominal tenderness.  ?Musculoskeletal:     ?   General: Normal range of motion.  ?   Cervical back: Neck supple.  ?    Right lower leg: No edema.  ?   Left lower leg: No edema.  ?Lymphadenopathy:  ?   Cervical: No cervical adenopathy.  ?Skin: ?   General: Skin is warm and dry.  ?Neurological:  ?   Mental Status: She is alert. Mental status is at baseline.  ?Psychiatric:     ?   Mood and Affect: Mood normal.  ? ? ? ?ASSESSMENT/ PLAN: ? ?TODAY ? ?Closed nondisplaced fracture of pelvis with routine healing unspecified part of pelvis subsequent encounter: will continue therapy as directed and will  follow up with orthopedics.  ? ?2. DM type 2 with neurological complications: hgb E2A 8.4 will continue synjardy 12.02/999 mg twice daily asa 81 mg daily  ? ?3. Dementia without behavioral disturbance: will continue to monitor her status.  ? ?4. Traumatic rhabdomyolysis has resolved with IVF  ? ?5. Aortic atherosclerosis ( ct 03-17-21)will continue asa 81 mg daily not on statin due to advanced age.  ? ? ? ?Ok Edwards NP ?[facility]  ? call [phone]  ? ?

## 2022-01-05 ENCOUNTER — Encounter: Payer: Self-pay | Admitting: Internal Medicine

## 2022-01-05 ENCOUNTER — Non-Acute Institutional Stay (SKILLED_NURSING_FACILITY): Payer: Medicare Other | Admitting: Internal Medicine

## 2022-01-05 DIAGNOSIS — F0390 Unspecified dementia without behavioral disturbance: Secondary | ICD-10-CM

## 2022-01-05 DIAGNOSIS — S329XXA Fracture of unspecified parts of lumbosacral spine and pelvis, initial encounter for closed fracture: Secondary | ICD-10-CM | POA: Diagnosis not present

## 2022-01-05 DIAGNOSIS — E1149 Type 2 diabetes mellitus with other diabetic neurological complication: Secondary | ICD-10-CM

## 2022-01-05 DIAGNOSIS — T796XXD Traumatic ischemia of muscle, subsequent encounter: Secondary | ICD-10-CM

## 2022-01-05 NOTE — Assessment & Plan Note (Addendum)
PT/OT at SNF as tolerated.  Recurrent falls and risk of musculoskeletal or CNS injury highly likely as patient is noncompliant with family admonitions to employ walker. ?

## 2022-01-05 NOTE — Assessment & Plan Note (Addendum)
Current A1c 8.4% which is probably realistic goal based on her advanced age and dementia.  Glucose is being monitored twice daily at the SNF. ?

## 2022-01-05 NOTE — Assessment & Plan Note (Signed)
No active symptoms of rhabdo.  Process resolved. ?

## 2022-01-05 NOTE — Progress Notes (Signed)
? ?NURSING HOME LOCATION: [facility] ?ROOM NUMBER:  155 P ? ?CODE STATUS:  DNR ? ?PCP:  Jani Gravel MD ? ?This is a comprehensive admission note to this SNFperformed on this date less than 30 days from date of admission. ?Included are preadmission medical/surgical history; reconciled medication list; family history; social history and comprehensive review of systems.  ?Corrections and additions to the records were documented. Comprehensive physical exam was also performed. Additionally a clinical summary was entered for each active diagnosis pertinent to this admission in the Problem List to enhance continuity of care. ? ?HPI: She was hospitalized 3/2 - 01/03/2022 following an unwitnessed fall at home.  Unfortunately patient has dementia and was unable to provide any history.  She had apparently been down for well over 36 hours before family members found her.  Daughter in law states that she refuses to use her walker at home. ?In the ED pelvic fractures were documented.  CK was elevated at 2000 but creatinine was within normal limits.  GFR was greater than 60.  CT of the head and cervical spine revealed no acute findings, but did reveal atrophy and small vessel disease.  Total bilirubin was 2.5 with AST of 122 and ALT of 43.  Urinalysis revealed moderate blood, small leukocytes, and negative nitrite and bacteria despite greater than 50 WBCs. ?CK with rehydration was 168 prior to discharge.  A1c was 8.4%. ?PT/OT recommended SNF placement for rehab. ? ?Past medical and surgical history: Includes diabetes with neurovascular complications, adenomatous colon polyps, and history of chronic diarrhea. ?Surgical procedures including colonoscopy with polypectomy, hernia repair and hysteroscopy with D&C. ? ?Social history: Nondrinker; never smoked ? ?Family history: Noncontributory due to advanced age. ?  ?Review of systems: Clinical neurocognitive deficits made validity of responses questionable, hindering  ROS completion. Date given as "March 23, Wednesday".  She actually identified the POTUS ?Thankfully her daughter-in-law was present. Her daughter-in-law believes that she may have fallen on Tuesday and was not found until Thursday.  She was found lying in her urine.  She apparently had attempted to pull her phone close to her but no family members were called.  The daughter-in-law states that the patient has had longstanding memory issues and is "stubborn".  This is manifested as unsteadiness yet unwillingness to use a walker. ?The patient tells me that she has "stomach hurting" & when she states that she places her hand over the epigastric area.  When asked what had happened she said "tripped and fell".  She cannot elaborate further.  She does describe her bowels "running off a while ago".  According to the daughter-in-law she has intermittent loose-watery stools. ?Wound care nurse documents wounds at each buttock.  Here at the SNF fasting blood sugars have been 125 and 225.  At bedtime values were 249 and 300. ? ?Physical exam:  ?Pertinent or positive findings: She appears her stated age.  Hair is very fine.  She is markedly hard of hearing.  She has bilateral ptosis.  Facies tend to be blank.  She blinks her eyes repeatedly.  Eyebrows are decreased laterally.  She has an upper plate and a few remaining lower anterior mandibular teeth.  She is not wearing a lower partial.  Slight tachycardia is present.  Breath sounds are decreased.  Abdomen is protuberant.  There is trace edema at the sock line.  The right dorsalis pedis pulse was stronger than the others which are decreased.  The NP student did remove her socks and  found isolated sores over both feet which appear to be related to her dragging herself on the floor. ? ?General appearance: no acute distress, increased work of breathing is present.   ?Lymphatic: No lymphadenopathy about the head, neck, axilla. ?Eyes: No conjunctival inflammation or lid edema is  present. There is no scleral icterus. ?Ears:  External ear exam shows no significant lesions or deformities.   ?Nose:  External nasal examination shows no deformity or inflammation. Nasal mucosa are pink and moist without lesions, exudates ?Neck:  No thyromegaly, masses, tenderness noted.    ?Heart:  No murmur, click, rub.  ?Lungs:  without wheezes, rhonchi, rales, rubs. ?Abdomen: Bowel sounds are normal.  Abdomen is soft and nontender with no organomegaly, hernias, masses. ?GU: Deferred  ?Extremities:  No cyanosis, clubbing. ?Neurologic exam: Balance, Rhomberg, finger to nose testing could not be completed due to clinical state ?Skin: Warm & dry w/o tenting. ?No significant  rash. ? ?See clinical summary under each active problem in the Problem List with associated updated therapeutic plan ? ?

## 2022-01-05 NOTE — Assessment & Plan Note (Signed)
Problematic is return home alone without one-on-one supervision. ?

## 2022-01-05 NOTE — Patient Instructions (Signed)
See assessment and plan under each diagnosis in the problem list and acutely for this visit 

## 2022-01-06 DIAGNOSIS — I700 Atherosclerosis of aorta: Secondary | ICD-10-CM | POA: Insufficient documentation

## 2022-01-19 ENCOUNTER — Other Ambulatory Visit: Payer: Self-pay | Admitting: Adult Health

## 2022-01-19 ENCOUNTER — Encounter: Payer: Self-pay | Admitting: Adult Health

## 2022-01-19 ENCOUNTER — Non-Acute Institutional Stay (SKILLED_NURSING_FACILITY): Payer: Medicare Other | Admitting: Adult Health

## 2022-01-19 DIAGNOSIS — E1149 Type 2 diabetes mellitus with other diabetic neurological complication: Secondary | ICD-10-CM | POA: Diagnosis not present

## 2022-01-19 DIAGNOSIS — F0390 Unspecified dementia without behavioral disturbance: Secondary | ICD-10-CM | POA: Diagnosis not present

## 2022-01-19 DIAGNOSIS — S329XXD Fracture of unspecified parts of lumbosacral spine and pelvis, subsequent encounter for fracture with routine healing: Secondary | ICD-10-CM | POA: Diagnosis not present

## 2022-01-19 DIAGNOSIS — I700 Atherosclerosis of aorta: Secondary | ICD-10-CM

## 2022-01-19 MED ORDER — SYNJARDY 12.5-1000 MG PO TABS: 1.0000 | ORAL_TABLET | Freq: Two times a day (BID) | ORAL | 0 refills | Status: DC

## 2022-01-19 NOTE — Progress Notes (Signed)
? ?Location:  [facility] ?Nursing Home Room Number: 155-P ?Place of Service:  SNF (31) ? ?Provider: Ok Edwards  ? ?PCP: Jani Gravel, MD ?Patient Care Team: ?Jani Gravel, MD as PCP - General (Internal Medicine) ? ?Extended Emergency Contact Information ?Primary Emergency Contact: Gentle,Jane ?         [place], [place] [address] [place] ?Home Phone: [phone] ?Mobile Phone: [phone] ?Relation: Relative ?Secondary Emergency Contact: Digman,Ronald ?Mobile Phone: [phone] ?Relation: Son ? ?Code Status: dnr  ?Goals of care:  Advanced Directive information ? ?  01/21/2022  ?  9:42 AM  ?Advanced Directives  ?Does Patient Have a Medical Advance Directive? Yes  ?Type of Advance Directive Out of facility DNR (pink MOST or yellow form)  ?Does patient want to make changes to medical advance directive? No - Patient declined  ?Pre-existing out of facility DNR order (yellow form or pink MOST form) Yellow form placed in chart (order not valid for inpatient use)  ? ? ? ?Allergies  ?Allergen Reactions  ? Contrast Media [Iodinated Contrast Media] Swelling  ? Metrizamide Swelling  ? ? ?Chief Complaint  ?Patient presents with  ? Discharge Note  ?  Discharge from [facility]   ? ? ?HPI:  ?88 y.o. female  being discharged to home with home health for pt/ot. She will need a bedside commode; wheelchair. She will need her prescriptions written and will need to follow up with her medical provider. She had been hospitalized for fall; pelvic fracture and rhabdomyolysis. She was admitted to this facility for short term rehab. She has participated in pt/ot/ to improve upon her level of independence with her adls. She is ready for discharge.   ? ? ? ?Past Medical History:  ?Diagnosis Date  ? Diabetes mellitus   ? HOH (hard of hearing)   ? Poor historian   ? Sinus trouble   ? ? ?Past Surgical History:  ?Procedure Laterality Date  ? COLONOSCOPY  2007  ? Dr. Gala Romney: tubular adenomas/rectal  ? COLONOSCOPY N/A  01/06/2014  ? NOB:SJGGEZM diverticulosis. Colonic polyp-removed as described above. Status post segmental biopsy and stool collection for culture  ? HERNIA REPAIR    ? umbilical, [place]  ? HYSTEROSCOPY WITH D & C  05/23/2012  ? Procedure: DILATATION AND CURETTAGE /HYSTEROSCOPY;  Surgeon: Florian Buff, MD;  Location: [facility];  Service: Gynecology;  Laterality: N/A;  ? POLYPECTOMY  05/23/2012  ? Procedure: POLYPECTOMY;  Surgeon: Florian Buff, MD;  Location: [facility];  Service: Gynecology;  Laterality: N/A;  endometrial polypectomy  ? ? ?  reports that she has never smoked. She has never used smokeless tobacco. She reports that she does not drink alcohol and does not use drugs. ?Social History  ? ?Socioeconomic History  ? Marital status: Married  ?  Spouse name: Not on file  ? Number of children: Not on file  ? Years of education: Not on file  ? Highest education level: Not on file  ?Occupational History  ? Not on file  ?Tobacco Use  ? Smoking status: Never  ? Smokeless tobacco: Never  ?Vaping Use  ? Vaping Use: Never used  ?Substance and Sexual Activity  ? Alcohol use: No  ? Drug use: No  ? Sexual activity: Not on file  ?Other Topics Concern  ? Not on file  ?Social History Narrative  ? Not on file  ? ?Social Determinants of Health  ? ?Financial Resource Strain: Not on file  ?Food Insecurity: Not on  file  ?Transportation Needs: Not on file  ?Physical Activity: Not on file  ?Stress: Not on file  ?Social Connections: Not on file  ?Intimate Partner Violence: Not on file  ? ?Functional Status Survey: ?  ? ?Allergies  ?Allergen Reactions  ? Contrast Media [Iodinated Contrast Media] Swelling  ? Metrizamide Swelling  ? ? ?Pertinent  Health Maintenance Due  ?Topic Date Due  ? FOOT EXAM  Never done  ? OPHTHALMOLOGY EXAM  Never done  ? URINE MICROALBUMIN  Never done  ? DEXA SCAN  Never done  ? INFLUENZA VACCINE  05/31/2021  ? HEMOGLOBIN A1C  07/02/2022  ? ? ?Medications: ?Outpatient Encounter Medications as of 01/19/2022  ?Medication  Sig  ? acetaminophen (TYLENOL) 325 MG tablet Take 2 tablets (650 mg total) by mouth every 6 (six) hours as needed for mild pain (or Fever >/= 101).  ? aspirin EC 81 MG tablet Take 81 mg by mouth daily.  ? Balsam [place]-Castor Oil (VENELEX) OINT Apply 1 application. topically as directed. Apply to buttocks Every Shift; Day, Evening, Night  ? Calcium Carbonate-Vitamin D (CALTRATE 600+D PO) Take 1 tablet by mouth 2 (two) times daily.  ? ibuprofen (ADVIL) 600 MG tablet Take 1 tablet (600 mg total) by mouth every 6 (six) hours as needed for mild pain.  ? Multiple Vitamin (MULTIVITAMIN WITH MINERALS) TABS Take 1 tablet by mouth daily.  ? [DISCONTINUED] SYNJARDY 12.02-999 MG TABS Take 1 tablet by mouth 2 (two) times daily.  ? ?No facility-administered encounter medications on file as of 01/19/2022.  ? ? ?Vitals:  ? 01/19/22 1056  ?BP: (!) 129/56  ?Pulse: (!) 59  ?Resp: 16  ?Temp: (!) 97.2 ?F (36.2 ?C)  ?SpO2: 96%  ?Weight: 126 lb 12.8 oz (57.5 kg)  ?Height: '5\' 2"'$  (1.575 m)  ? ?Body mass index is 23.19 kg/m?. ? ? ?SIGNIFICANT DIAGNOSTIC EXAMS ? ?PREVIOUS  ? ?12-30-21: chest x-ray: Mild cardiomegaly. No active disease  ? ?12-30-21: pelvic x-ray: Left superior and inferior pubic rami fractures  ? ?12-30-21: ct of cervical spine: Degenerative changes in the cervical spine. No acute bony abnormality. ? ?12-30-21: ct of head: Atrophy, chronic microvascular disease. No acute intracranial abnormality. ? ?NO NEW EXAMS  ? ?LABS REVIEWED; PREVIOUS  ? ?12-30-21: wbc 12.4; hgb 15.7; hct 47.3; mcv 94.0 plt 185; glucose 181; bun 30; creat 081; k+ 4.1; na++ 137; ca 9.3; GFR>60 ast 122; total bili 2.5; protein 7.5; albumin 4.5; CK 2062; hgb a1c 8.4 ?01-01-22: glucose 207; bun 22; creat 0.63; k+ 3.8; na++ 136; ca 8.2; GFR>60 CK 566 ?01-03-22: glucose 182; bun 12; creat 0.61; k+ 4.0; na++ 137; ca 8.3 GFR >60 ? ?Review of Systems  ?Constitutional:  Negative for malaise/fatigue.  ?Respiratory:  Negative for cough and shortness of breath.   ?Cardiovascular:   Negative for chest pain, palpitations and leg swelling.  ?Gastrointestinal:  Negative for abdominal pain, constipation and heartburn.  ?Musculoskeletal:  Negative for back pain, joint pain and myalgias.  ?Skin: Negative.   ?Neurological:  Negative for dizziness.  ?Psychiatric/Behavioral:  The patient is not nervous/anxious.   ? ?Physical Exam ?Constitutional:   ?   General: She is not in acute distress. ?   Appearance: She is well-developed. She is not diaphoretic.  ?Neck:  ?   Thyroid: No thyromegaly.  ?Cardiovascular:  ?   Rate and Rhythm: Normal rate and regular rhythm.  ?   Pulses: Normal pulses.  ?   Heart sounds: Normal heart sounds.  ?Pulmonary:  ?  Effort: Pulmonary effort is normal. No respiratory distress.  ?   Breath sounds: Normal breath sounds.  ?Abdominal:  ?   General: Bowel sounds are normal. There is no distension.  ?   Palpations: Abdomen is soft.  ?   Tenderness: There is no abdominal tenderness.  ?Musculoskeletal:     ?   General: Normal range of motion.  ?   Cervical back: Neck supple.  ?   Right lower leg: No edema.  ?   Left lower leg: No edema.  ?Lymphadenopathy:  ?   Cervical: No cervical adenopathy.  ?Skin: ?   General: Skin is warm and dry.  ?Neurological:  ?   Mental Status: She is alert and oriented to person, place, and time.  ?Psychiatric:     ?   Mood and Affect: Mood normal.  ? ? ? ? ?Assessment/Plan:   ? ?Patient is being discharged with the following home health services:  pt/ot to evaluate and treat as indicated for gait balance strength adl training.  ? ?Patient is being discharged with the following durable medical equipment:  bedside commode; wheelchair.  ? ?Patient has been advised to f/u with their PCP in 1-2 weeks to for a transitions of care visit.  Social services at their facility was responsible for arranging this appointment.  Pt was provided with adequate prescriptions of noncontrolled medications to reach the scheduled appointment .  For controlled substances, a  limited supply was provided as appropriate for the individual patient.  If the pt normally receives these medications from a pain clinic or has a contract with another physician, these medications should be rec

## 2022-01-21 ENCOUNTER — Encounter: Payer: Self-pay | Admitting: Adult Health

## 2022-01-21 NOTE — Progress Notes (Signed)
?Location:  [facility] Room Number: 155-P ?Place of Service:  SNF (31) ? ? ?CODE STATUS: DNR ? ?Allergies  ?Allergen Reactions  ? Contrast Media [Iodinated Contrast Media] Swelling  ? Metrizamide Swelling  ? ? ?Chief Complaint  ?Patient presents with  ? Acute Visit  ?  Care plan meeting  ? ? ?HPI: ? ? ? ?Past Medical History:  ?Diagnosis Date  ? Diabetes mellitus   ? HOH (hard of hearing)   ? Poor historian   ? Sinus trouble   ? ? ?Past Surgical History:  ?Procedure Laterality Date  ? COLONOSCOPY  2007  ? Dr. Gala Romney: tubular adenomas/rectal  ? COLONOSCOPY N/A 01/06/2014  ? YIR:SWNIOEV diverticulosis. Colonic polyp-removed as described above. Status post segmental biopsy and stool collection for culture  ? HERNIA REPAIR    ? umbilical, [place]  ? HYSTEROSCOPY WITH D & C  05/23/2012  ? Procedure: DILATATION AND CURETTAGE /HYSTEROSCOPY;  Surgeon: Florian Buff, MD;  Location: [facility];  Service: Gynecology;  Laterality: N/A;  ? POLYPECTOMY  05/23/2012  ? Procedure: POLYPECTOMY;  Surgeon: Florian Buff, MD;  Location: [facility];  Service: Gynecology;  Laterality: N/A;  endometrial polypectomy  ? ? ?Social History  ? ?Socioeconomic History  ? Marital status: Married  ?  Spouse name: Not on file  ? Number of children: Not on file  ? Years of education: Not on file  ? Highest education level: Not on file  ?Occupational History  ? Not on file  ?Tobacco Use  ? Smoking status: Never  ? Smokeless tobacco: Never  ?Vaping Use  ? Vaping Use: Never used  ?Substance and Sexual Activity  ? Alcohol use: No  ? Drug use: No  ? Sexual activity: Not on file  ?Other Topics Concern  ? Not on file  ?Social History Narrative  ? Not on file  ? ?Social Determinants of Health  ? ?Financial Resource Strain: Not on file  ?Food Insecurity: Not on file  ?Transportation Needs: Not on file  ?Physical Activity: Not on file  ?Stress: Not on file  ?Social Connections: Not on file  ?Intimate Partner Violence: Not on file  ? ?Family History   ?Problem Relation Age of Onset  ? Colon cancer Neg Hx   ? ? ? ? ?VITAL SIGNS ?BP 107/79   Pulse (!) 108   Temp (!) 97.5 ?F (36.4 ?C)   Resp 20   Ht '5\' 2"'$  (1.575 m)   Wt 126 lb 12.8 oz (57.5 kg)   SpO2 97%   BMI 23.19 kg/m?  ? ?Outpatient Encounter Medications as of 01/21/2022  ?Medication Sig  ? acetaminophen (TYLENOL) 325 MG tablet Take 2 tablets (650 mg total) by mouth every 6 (six) hours as needed for mild pain (or Fever >/= 101).  ? aspirin EC 81 MG tablet Take 81 mg by mouth daily.  ? Balsam [place]-Castor Oil (VENELEX) OINT Apply 1 application. topically as directed. Apply to buttocks Every Shift; Day, Evening, Night  ? Calcium Carbonate-Vitamin D (CALTRATE 600+D PO) Take 1 tablet by mouth 2 (two) times daily.  ? ibuprofen (ADVIL) 600 MG tablet Take 1 tablet (600 mg total) by mouth every 6 (six) hours as needed for mild pain.  ? Multiple Vitamin (MULTIVITAMIN WITH MINERALS) TABS Take 1 tablet by mouth daily.  ? NON FORMULARY Diet: Dysphagia 3 diet with dysphagia 2 meats and Thin liquids.  ? Nutritional Supplements (ENSURE ENLIVE PO) 120 ml BID due to acute weight loss and  variable meal intake.  ?Twice A Day Between Meals  ? SYNJARDY 12.02-999 MG TABS Take 1 tablet by mouth 2 (two) times daily.  ? ?No facility-administered encounter medications on file as of 01/21/2022.  ? ? ? ?SIGNIFICANT DIAGNOSTIC EXAMS ? ? ? ? ? ? ?ASSESSMENT/ PLAN: ? ? ? ? ?Ok Edwards NP ?[facility]  ?Contact [phone] Monday through Friday 8am- 5pm  ?After hours call [phone]  ? ?

## 2022-01-22 ENCOUNTER — Other Ambulatory Visit: Payer: Self-pay | Admitting: Adult Health

## 2022-01-26 NOTE — Progress Notes (Signed)
This encounter was created in error - please disregard.

## 2022-04-19 ENCOUNTER — Inpatient Hospital Stay (HOSPITAL_COMMUNITY)
Admission: EM | Admit: 2022-04-19 | Discharge: 2022-04-26 | DRG: 956 | Disposition: A | Discharge status: Skilled Nursing Facility | Payer: Medicare Other | Attending: Internal Medicine | Admitting: Internal Medicine

## 2022-04-19 ENCOUNTER — Other Ambulatory Visit: Payer: Self-pay

## 2022-04-19 ENCOUNTER — Emergency Department (HOSPITAL_COMMUNITY): Payer: Medicare Other

## 2022-04-19 ENCOUNTER — Encounter (HOSPITAL_COMMUNITY): Payer: Self-pay | Admitting: *Deleted

## 2022-04-19 DIAGNOSIS — L89301 Pressure ulcer of unspecified buttock, stage 1: Secondary | ICD-10-CM | POA: Diagnosis present

## 2022-04-19 DIAGNOSIS — Z91041 Radiographic dye allergy status: Secondary | ICD-10-CM

## 2022-04-19 DIAGNOSIS — D62 Acute posthemorrhagic anemia: Secondary | ICD-10-CM | POA: Diagnosis present

## 2022-04-19 DIAGNOSIS — Z66 Do not resuscitate: Secondary | ICD-10-CM | POA: Diagnosis present

## 2022-04-19 DIAGNOSIS — F209 Schizophrenia, unspecified: Secondary | ICD-10-CM | POA: Diagnosis present

## 2022-04-19 DIAGNOSIS — S72009A Fracture of unspecified part of neck of unspecified femur, initial encounter for closed fracture: Secondary | ICD-10-CM | POA: Diagnosis present

## 2022-04-19 DIAGNOSIS — F03A0 Unspecified dementia, mild, without behavioral disturbance, psychotic disturbance, mood disturbance, and anxiety: Secondary | ICD-10-CM | POA: Diagnosis present

## 2022-04-19 DIAGNOSIS — E119 Type 2 diabetes mellitus without complications: Secondary | ICD-10-CM | POA: Diagnosis not present

## 2022-04-19 DIAGNOSIS — S32512A Fracture of superior rim of left pubis, initial encounter for closed fracture: Secondary | ICD-10-CM

## 2022-04-19 DIAGNOSIS — Z7984 Long term (current) use of oral hypoglycemic drugs: Secondary | ICD-10-CM

## 2022-04-19 DIAGNOSIS — F0390 Unspecified dementia without behavioral disturbance: Secondary | ICD-10-CM | POA: Diagnosis not present

## 2022-04-19 DIAGNOSIS — E1165 Type 2 diabetes mellitus with hyperglycemia: Secondary | ICD-10-CM | POA: Diagnosis present

## 2022-04-19 DIAGNOSIS — R1310 Dysphagia, unspecified: Secondary | ICD-10-CM | POA: Diagnosis present

## 2022-04-19 DIAGNOSIS — W010XXA Fall on same level from slipping, tripping and stumbling without subsequent striking against object, initial encounter: Secondary | ICD-10-CM | POA: Diagnosis present

## 2022-04-19 DIAGNOSIS — E860 Dehydration: Secondary | ICD-10-CM | POA: Diagnosis present

## 2022-04-19 DIAGNOSIS — Y9301 Activity, walking, marching and hiking: Secondary | ICD-10-CM | POA: Diagnosis present

## 2022-04-19 DIAGNOSIS — S72001A Fracture of unspecified part of neck of right femur, initial encounter for closed fracture: Secondary | ICD-10-CM | POA: Diagnosis present

## 2022-04-19 DIAGNOSIS — S329XXA Fracture of unspecified parts of lumbosacral spine and pelvis, initial encounter for closed fracture: Secondary | ICD-10-CM | POA: Diagnosis present

## 2022-04-19 DIAGNOSIS — M898X9 Other specified disorders of bone, unspecified site: Secondary | ICD-10-CM | POA: Diagnosis present

## 2022-04-19 DIAGNOSIS — W19XXXA Unspecified fall, initial encounter: Principal | ICD-10-CM

## 2022-04-19 DIAGNOSIS — S32591A Other specified fracture of right pubis, initial encounter for closed fracture: Secondary | ICD-10-CM | POA: Diagnosis present

## 2022-04-19 DIAGNOSIS — M810 Age-related osteoporosis without current pathological fracture: Secondary | ICD-10-CM | POA: Diagnosis present

## 2022-04-19 DIAGNOSIS — N289 Disorder of kidney and ureter, unspecified: Secondary | ICD-10-CM | POA: Diagnosis not present

## 2022-04-19 DIAGNOSIS — S72001D Fracture of unspecified part of neck of right femur, subsequent encounter for closed fracture with routine healing: Secondary | ICD-10-CM | POA: Diagnosis not present

## 2022-04-19 DIAGNOSIS — E1149 Type 2 diabetes mellitus with other diabetic neurological complication: Secondary | ICD-10-CM | POA: Diagnosis present

## 2022-04-19 DIAGNOSIS — H9190 Unspecified hearing loss, unspecified ear: Secondary | ICD-10-CM | POA: Diagnosis present

## 2022-04-19 DIAGNOSIS — Z79899 Other long term (current) drug therapy: Secondary | ICD-10-CM

## 2022-04-19 DIAGNOSIS — Z7982 Long term (current) use of aspirin: Secondary | ICD-10-CM

## 2022-04-19 DIAGNOSIS — N179 Acute kidney failure, unspecified: Secondary | ICD-10-CM | POA: Diagnosis present

## 2022-04-19 DIAGNOSIS — S329XXD Fracture of unspecified parts of lumbosacral spine and pelvis, subsequent encounter for fracture with routine healing: Secondary | ICD-10-CM

## 2022-04-19 DIAGNOSIS — L8990 Pressure ulcer of unspecified site, unspecified stage: Secondary | ICD-10-CM | POA: Diagnosis present

## 2022-04-19 DIAGNOSIS — E871 Hypo-osmolality and hyponatremia: Secondary | ICD-10-CM | POA: Diagnosis present

## 2022-04-19 LAB — CBC WITH DIFFERENTIAL/PLATELET
Abs Immature Granulocytes: 0.04 10*3/uL (ref 0.00–0.07)
Basophils Absolute: 0 10*3/uL (ref 0.0–0.1)
Basophils Relative: 0 %
Eosinophils Absolute: 0.1 10*3/uL (ref 0.0–0.5)
Eosinophils Relative: 1 %
HCT: 39.7 % (ref 36.0–46.0)
Hemoglobin: 13.1 g/dL (ref 12.0–15.0)
Immature Granulocytes: 0 %
Lymphocytes Relative: 13 %
Lymphs Abs: 1.4 10*3/uL (ref 0.7–4.0)
MCH: 29.4 pg (ref 26.0–34.0)
MCHC: 33 g/dL (ref 30.0–36.0)
MCV: 89.2 fL (ref 80.0–100.0)
Monocytes Absolute: 1 10*3/uL (ref 0.1–1.0)
Monocytes Relative: 9 %
Neutro Abs: 8.4 10*3/uL — ABNORMAL HIGH (ref 1.7–7.7)
Neutrophils Relative %: 77 %
Platelets: 204 10*3/uL (ref 150–400)
RBC: 4.45 MIL/uL (ref 3.87–5.11)
RDW: 13.4 % (ref 11.5–15.5)
WBC: 10.8 10*3/uL — ABNORMAL HIGH (ref 4.0–10.5)
nRBC: 0 % (ref 0.0–0.2)

## 2022-04-19 LAB — COMPREHENSIVE METABOLIC PANEL WITH GFR
ALT: 14 U/L (ref 0–44)
AST: 21 U/L (ref 15–41)
Albumin: 3.8 g/dL (ref 3.5–5.0)
Alkaline Phosphatase: 50 U/L (ref 38–126)
Anion gap: 7 (ref 5–15)
BUN: 19 mg/dL (ref 8–23)
CO2: 28 mmol/L (ref 22–32)
Calcium: 9.3 mg/dL (ref 8.9–10.3)
Chloride: 101 mmol/L (ref 98–111)
Creatinine, Ser: 0.82 mg/dL (ref 0.44–1.00)
GFR, Estimated: >60 mL/min
Glucose, Bld: 155 mg/dL — ABNORMAL HIGH (ref 70–99)
Potassium: 4.1 mmol/L (ref 3.5–5.1)
Sodium: 136 mmol/L (ref 135–145)
Total Bilirubin: 0.8 mg/dL (ref 0.3–1.2)
Total Protein: 6.4 g/dL — ABNORMAL LOW (ref 6.5–8.1)

## 2022-04-19 LAB — CBG MONITORING, ED
Glucose-Capillary: 220 mg/dL — ABNORMAL HIGH (ref 70–99)
Glucose-Capillary: 257 mg/dL — ABNORMAL HIGH (ref 70–99)

## 2022-04-19 MED ORDER — ONDANSETRON HCL 4 MG/2ML IJ SOLN: 4.0000 mg | Freq: Four times a day (QID) | INTRAMUSCULAR | Status: DC | PRN

## 2022-04-19 MED ORDER — ASPIRIN 81 MG PO TBEC
81.0000 mg | DELAYED_RELEASE_TABLET | Freq: Every day | ORAL | Status: DC
  Administered 2022-04-20 – 2022-04-26 (×5): 81 mg via ORAL
  Filled 2022-04-19 (×5): qty 1

## 2022-04-19 MED ORDER — SODIUM CHLORIDE 0.9% FLUSH
3.0000 mL | Freq: Two times a day (BID) | INTRAVENOUS | Status: DC
  Administered 2022-04-20 – 2022-04-26 (×12): 3 mL via INTRAVENOUS

## 2022-04-19 MED ORDER — HEPARIN SODIUM (PORCINE) 5000 UNIT/ML IJ SOLN
5000.0000 [IU] | Freq: Three times a day (TID) | INTRAMUSCULAR | Status: DC
  Administered 2022-04-20 – 2022-04-22 (×5): 5000 [IU] via SUBCUTANEOUS
  Filled 2022-04-19 (×5): qty 1

## 2022-04-19 MED ORDER — POLYETHYLENE GLYCOL 3350 17 G PO PACK
17.0000 g | PACK | Freq: Every day | ORAL | Status: DC
Start: 2022-04-21 — End: 2022-04-26
  Administered 2022-04-23 – 2022-04-26 (×4): 17 g via ORAL
  Filled 2022-04-19 (×4): qty 1

## 2022-04-19 MED ORDER — METHOCARBAMOL 500 MG PO TABS
500.0000 mg | ORAL_TABLET | Freq: Three times a day (TID) | ORAL | Status: AC
  Administered 2022-04-19 – 2022-04-21 (×6): 500 mg via ORAL
  Filled 2022-04-19 (×7): qty 1

## 2022-04-19 MED ORDER — FENTANYL CITRATE PF 50 MCG/ML IJ SOSY: 25.0000 ug | PREFILLED_SYRINGE | INTRAMUSCULAR | Status: DC | PRN

## 2022-04-19 MED ORDER — SODIUM CHLORIDE 0.9 % IV SOLN: INTRAVENOUS | Status: DC | PRN

## 2022-04-19 MED ORDER — TRAZODONE HCL 50 MG PO TABS
50.0000 mg | ORAL_TABLET | Freq: Every evening | ORAL | Status: DC | PRN
  Administered 2022-04-23 – 2022-04-24 (×2): 50 mg via ORAL
  Filled 2022-04-19 (×2): qty 1

## 2022-04-19 MED ORDER — ACETAMINOPHEN 650 MG RE SUPP: 650.0000 mg | Freq: Four times a day (QID) | RECTAL | Status: DC | PRN

## 2022-04-19 MED ORDER — SODIUM CHLORIDE 0.9% FLUSH: 3.0000 mL | INTRAVENOUS | Status: DC | PRN

## 2022-04-19 MED ORDER — ACETAMINOPHEN 325 MG PO TABS: 650.0000 mg | ORAL_TABLET | Freq: Four times a day (QID) | ORAL | Status: DC | PRN

## 2022-04-19 MED ORDER — BISACODYL 10 MG RE SUPP: 10.0000 mg | Freq: Every day | RECTAL | Status: DC | PRN

## 2022-04-19 MED ORDER — ONDANSETRON HCL 4 MG PO TABS: 4.0000 mg | ORAL_TABLET | Freq: Four times a day (QID) | ORAL | Status: DC | PRN

## 2022-04-19 MED ORDER — LORAZEPAM 2 MG/ML IJ SOLN: 0.5000 mg | Freq: Four times a day (QID) | INTRAMUSCULAR | Status: DC | PRN

## 2022-04-19 MED ORDER — INSULIN ASPART 100 UNIT/ML IJ SOLN
0.0000 [IU] | Freq: Three times a day (TID) | INTRAMUSCULAR | Status: DC
  Administered 2022-04-20 – 2022-04-21 (×3): 1 [IU] via SUBCUTANEOUS
  Administered 2022-04-22 – 2022-04-23 (×3): 2 [IU] via SUBCUTANEOUS
  Administered 2022-04-23: 4 [IU] via SUBCUTANEOUS
  Administered 2022-04-24 (×2): 2 [IU] via SUBCUTANEOUS
  Filled 2022-04-19: qty 1

## 2022-04-19 MED ORDER — OXYCODONE HCL 5 MG PO TABS
5.0000 mg | ORAL_TABLET | ORAL | Status: DC | PRN
  Administered 2022-04-20 – 2022-04-23 (×5): 5 mg via ORAL
  Filled 2022-04-19 (×5): qty 1

## 2022-04-19 MED ORDER — SODIUM CHLORIDE 0.9% FLUSH
3.0000 mL | Freq: Two times a day (BID) | INTRAVENOUS | Status: DC
  Administered 2022-04-19 – 2022-04-26 (×7): 3 mL via INTRAVENOUS

## 2022-04-19 MED ORDER — INSULIN ASPART 100 UNIT/ML IJ SOLN
0.0000 [IU] | Freq: Every day | INTRAMUSCULAR | Status: DC
  Administered 2022-04-19: 3 [IU] via SUBCUTANEOUS
  Administered 2022-04-22: 4 [IU] via SUBCUTANEOUS
  Administered 2022-04-23 – 2022-04-24 (×2): 2 [IU] via SUBCUTANEOUS
  Filled 2022-04-19: qty 1

## 2022-04-19 MED ORDER — SENNOSIDES-DOCUSATE SODIUM 8.6-50 MG PO TABS
2.0000 | ORAL_TABLET | Freq: Every day | ORAL | Status: DC
  Administered 2022-04-19 – 2022-04-25 (×7): 2 via ORAL
  Filled 2022-04-19 (×8): qty 2

## 2022-04-19 MED ORDER — ADULT MULTIVITAMIN W/MINERALS CH
1.0000 | ORAL_TABLET | Freq: Every day | ORAL | Status: DC
  Administered 2022-04-19 – 2022-04-26 (×6): 1 via ORAL
  Filled 2022-04-19 (×6): qty 1

## 2022-04-19 NOTE — H&P (Signed)
Patient Demographics:    Candice Ray, is a 88 y.o. female  MRN: [id number]   DOB - Mar 10, 1932  Admit Date - 04/19/2022  Outpatient Primary MD for the patient is Jani Gravel, MD   Assessment & Plan:   Assessment and Plan:   1)Rt Hip Fx- -Hip and pelvic x-rays demonstrates moderately displaced proximal right femoral neck fracture and Moderately displaced left superior pubis ramus fracture. -As per EDP Dr. Marcelino Scot the orthopedic surgeon on-call accepts this patient for consultation -Please notify Dr. Marcelino Scot when patient [facility] at [facility] -IV fentanyl and p.o. oxycodone and methocarbamol as prescribed for pain control -  2)Dementia/schizophrenia--- high risk for hospital delirium/psychosis --- Okay to use lorazepam and or trazodone as needed restlessness or agitation  3)DM2-recent A1c 8.4 reflecting uncontrolled diabetes PTA -Hold SYNJARDY- -Use Novolog/Humalog Sliding scale insulin with Accu-Cheks/Fingersticks as ordered   4) social/ethics--plan of care and advanced directive discussed with patient's son Candice Ray at bedside -Patient is a DNR/DNI,  No limitations to treatment otherwise  Disposition/Need for in-Hospital Stay- patient unable to be discharged at this time due to --- right hip fracture requiring operative fixation at [facility] by Dr. Marcelino Scot  Status is: Inpatient  Remains inpatient appropriate because:   Dispo: The patient is from: Home              Anticipated d/c is to: SNF              Anticipated d/c date is: 2 days              Patient currently is not medically stable to d/c. Barriers: Not Clinically Stable-   With History of - Reviewed by me  Past Medical History:  Diagnosis Date   Diabetes mellitus    HOH (hard of hearing)    Poor historian    Sinus trouble        Past Surgical History:  Procedure Laterality Date   COLONOSCOPY  2007   Dr. Gala Romney: tubular adenomas/rectal   COLONOSCOPY N/A 01/06/2014   YIA:[id number] diverticulosis. Colonic polyp-removed as described above. Status post segmental biopsy and stool collection for culture   HERNIA REPAIR     umbilical, [place]   HYSTEROSCOPY WITH D & C  05/23/2012   Procedure: DILATATION AND CURETTAGE /HYSTEROSCOPY;  Surgeon: Florian Buff, MD;  Location: [facility];  Service: Gynecology;  Laterality: N/A;   POLYPECTOMY  05/23/2012   Procedure: POLYPECTOMY;  Surgeon: Florian Buff, MD;  Location: [facility];  Service: Gynecology;  Laterality: N/A;  endometrial polypectomy   Chief Complaint  Patient presents with   Fall     HPI:    Candice Ray  is a 88 y.o. female with Pmhx relevant for DM2, dementia , schizophrenia, hearing loss brought in by [facility] for c/o fall; As per pts son Candice Ray) pt decided to walk down the hallway w/o her walker------she stumbled and fell---Daughter in law witnessed the fall,-- pt  denies hitting her head, daughter in law concurs She is a diabetic, she is HOH and forgetful ,also has schizophrenia/psychosis as per son who is at bedside at this time -  No Nausea, Vomiting or Diarrhea No fever  Or chills  No chest pains, dizziness, palpitations, shortness of breath or dyspnea on exertion - Hip and pelvic x-rays demonstrate-- 1)Moderately displaced proximal right femoral neck fracture. 2)Moderately displaced left superior pubis ramus fracture. -WBC 10.8 Hgb 13.1, platelets 204 -Sodium 136 glucose 155 creatinine 0.82 -LFTs are not elevated -EKG with sinus rhythm and possible left anterior fascicular block -Chest x-ray and right knee x-rays without acute findings -EDP discussed case with on-call orthopedic surgeon Dr. Marcelino Scot who recommends transfer to [facility] for ORIF - Patient is a poor historian so most of the history is obtained from patient's son Candice Ray who is at bedside     Review of systems:    In addition to the HPI above,   A full Review of  Systems was done, all other systems reviewed are negative except as noted above in HPI , .    Social History:  Reviewed by me    Social History   Tobacco Use   Smoking status: Never   Smokeless tobacco: Never  Substance Use Topics   Alcohol use: No     Family History :  Reviewed by me    Family History  Problem Relation Age of Onset   Colon cancer Neg Hx      Home Medications:   Prior to Admission medications   Medication Sig Start Date End Date Taking? Authorizing Provider  aspirin EC 81 MG tablet Take 81 mg by mouth daily.   Yes [provider]  Calcium Carbonate-Vitamin D (CALTRATE 600+D PO) Take 1 tablet by mouth 2 (two) times daily.   Yes [provider]  ibuprofen (ADVIL) 600 MG tablet Take 1 tablet (600 mg total) by mouth every 6 (six) hours as needed for mild pain. 01/03/22  Yes Shahmehdi, Seyed A, MD  SYNJARDY 12.02-999 MG TABS Take 1 tablet by mouth 2 (two) times daily. 01/19/22  Yes Gerlene Fee, NP     Allergies:     Allergies  Allergen Reactions   Contrast Media [Iodinated Contrast Media] Swelling   Metrizamide Swelling     Physical Exam:   Vitals  Blood pressure 130/81, pulse 88, temperature 98.2 F (36.8 C), temperature source Oral, resp. rate 15, height '5\' 5"'$  (1.651 m), weight 59 kg, SpO2 94 %.  Physical Examination: General appearance - alert,  in no distress  Mental status - alert, memory and cognitive deficits Eyes - sclera anicteric Ears:- HOH Neck - supple, no JVD elevation , Chest - clear  to auscultation bilaterally, symmetrical air movement,  Heart - S1 and S2 normal, regular  Abdomen - soft, nontender, nondistended, +BS Neurological - screening mental status exam normal, neck supple without rigidity, cranial nerves II through XII intact, DTR's normal and symmetric Extremities - no pedal edema noted, intact peripheral pulses  Skin - warm,  dry MSK-point tenderness over right hip area, right lower extremity is shortened and rotated     Data Review:    CBC Recent Labs  Lab 04/19/22 1653  WBC 10.8*  HGB 13.1  HCT 39.7  PLT 204  MCV 89.2  MCH 29.4  MCHC 33.0  RDW 13.4  LYMPHSABS 1.4  MONOABS 1.0  EOSABS 0.1  BASOSABS 0.0   ------------------------------------------------------------------------------------------------------------------  Chemistries  Recent Labs  Lab 04/19/22  1653  NA 136  K 4.1  CL 101  CO2 28  GLUCOSE 155*  BUN 19  CREATININE 0.82  CALCIUM 9.3  AST 21  ALT 14  ALKPHOS 50  BILITOT 0.8   ------------------------------------------------------------------------------------------------------------------ estimated creatinine clearance is 42.7 mL/min (by C-G formula based on SCr of 0.82 mg/dL). ------------------------------------------------------------------------------------------------------------------ No results for input(s): "TSH", "T4TOTAL", "T3FREE", "THYROIDAB" in the last 72 hours.  Invalid input(s): "FREET3"   Coagulation profile No results for input(s): "INR", "PROTIME" in the last 168 hours. ------------------------------------------------------------------------------------------------------------------- No results for input(s): "DDIMER" in the last 72 hours. -------------------------------------------------------------------------------------------------------------------  Cardiac Enzymes No results for input(s): "CKMB", "TROPONINI", "MYOGLOBIN" in the last 168 hours.  Invalid input(s): "CK" ------------------------------------------------------------------------------------------------------------------ No results found for: "BNP"   ---------------------------------------------------------------------------------------------------------------  Urinalysis    Component Value Date/Time   COLORURINE YELLOW 12/30/2021 0150   APPEARANCEUR CLEAR 12/30/2021 0150    LABSPEC 1.024 12/30/2021 0150   PHURINE 5.0 12/30/2021 0150   GLUCOSEU >=500 (A) 12/30/2021 0150   HGBUR MODERATE (A) 12/30/2021 0150   BILIRUBINUR NEGATIVE 12/30/2021 0150   KETONESUR 80 (A) 12/30/2021 0150   PROTEINUR 30 (A) 12/30/2021 0150   NITRITE NEGATIVE 12/30/2021 0150   LEUKOCYTESUR SMALL (A) 12/30/2021 0150    ----------------------------------------------------------------------------------------------------------------   Imaging Results:    DG Knee Complete 4 Views Right  Result Date: 04/19/2022 CLINICAL DATA:  Knee pain after fall EXAM: RIGHT KNEE - COMPLETE 4+ VIEW COMPARISON:  None Available. FINDINGS: No fracture or malalignment. Mild tricompartment arthritis of the knee. No sizable effusion. There is mild lateral deviation of the patella. IMPRESSION: 1. No fracture is seen. 2. Mild lateral deviation of the patella 3. Mild tricompartment arthritis Electronically Signed   By: Donavan Foil M.D.   On: 04/19/2022 16:49   DG Chest Port 1 View  Result Date: 04/19/2022 CLINICAL DATA:  Right hip fracture. EXAM: PORTABLE CHEST 1 VIEW COMPARISON:  December 30, 2021. FINDINGS: The heart size and mediastinal contours are within normal limits. Both lungs are clear. The visualized skeletal structures are unremarkable. IMPRESSION: No active disease. Electronically Signed   By: Marijo Conception M.D.   On: 04/19/2022 16:20   DG Hip Unilat W or Wo Pelvis 2-3 Views Right  Result Date: 04/19/2022 CLINICAL DATA:  Right hip pain after fall today. EXAM: DG HIP (WITH OR WITHOUT PELVIS) 2-3V RIGHT COMPARISON:  None Available. FINDINGS: Moderately displaced proximal right femoral neck fracture is noted. Also noted is moderately displaced fracture involving the left superior pubic ramus. IMPRESSION: Moderately displaced proximal right femoral neck fracture. Moderately displaced left superior pubis ramus fracture. Electronically Signed   By: Marijo Conception M.D.   On: 04/19/2022 16:19    Radiological  Exams on Admission: DG Knee Complete 4 Views Right  Result Date: 04/19/2022 CLINICAL DATA:  Knee pain after fall EXAM: RIGHT KNEE - COMPLETE 4+ VIEW COMPARISON:  None Available. FINDINGS: No fracture or malalignment. Mild tricompartment arthritis of the knee. No sizable effusion. There is mild lateral deviation of the patella. IMPRESSION: 1. No fracture is seen. 2. Mild lateral deviation of the patella 3. Mild tricompartment arthritis Electronically Signed   By: Donavan Foil M.D.   On: 04/19/2022 16:49   DG Chest Port 1 View  Result Date: 04/19/2022 CLINICAL DATA:  Right hip fracture. EXAM: PORTABLE CHEST 1 VIEW COMPARISON:  December 30, 2021. FINDINGS: The heart size and mediastinal contours are within normal limits. Both lungs are clear. The visualized skeletal structures are unremarkable. IMPRESSION: No active disease. Electronically Signed  By: Marijo Conception M.D.   On: 04/19/2022 16:20   DG Hip Unilat W or Wo Pelvis 2-3 Views Right  Result Date: 04/19/2022 CLINICAL DATA:  Right hip pain after fall today. EXAM: DG HIP (WITH OR WITHOUT PELVIS) 2-3V RIGHT COMPARISON:  None Available. FINDINGS: Moderately displaced proximal right femoral neck fracture is noted. Also noted is moderately displaced fracture involving the left superior pubic ramus. IMPRESSION: Moderately displaced proximal right femoral neck fracture. Moderately displaced left superior pubis ramus fracture. Electronically Signed   By: Marijo Conception M.D.   On: 04/19/2022 16:19    DVT Prophylaxis -SCD /Heparin AM Labs Ordered, also please review Full Orders  Family Communication: Admission, patients condition and plan of care including tests being ordered have been discussed with the patient and son Candice Ray who indicate understanding and agree with the plan   Condition   -stable   Roxan Hockey M.D on 04/19/2022 at 6:02 PM Go to [email or web address] -  for contact info  [facility] - Office  [phone]

## 2022-04-19 NOTE — ED Triage Notes (Addendum)
Pt brought in by [facility] for c/o fall; As per pts son Siarah Deleo) pt decided to walk down the hallway w/o her walker------she stumbled and fell---Daughter in law witnessed the fall She is a diabetic, she is HOH and forgetful ,also has schizophrenia/psychosis as per son who is at bedside at this time - pt c/o right abdominal pain; pt denies hitting her head

## 2022-04-19 NOTE — ED Provider Notes (Signed)
[facility] EMERGENCY DEPARTMENT Provider Note   CSN: [id number] Arrival date & time: 04/19/22  1456     History  Chief Complaint  Patient presents with  . Fall    Candice Ray is a 88 y.o. female.   Fall Pertinent negatives include no chest pain, no abdominal pain and no shortness of breath.  88 year old female presents emergency department after mechanical fall.  Patient states that she was walking with her walker, carrying some close when she lost her balance and fell on her right side.  Patient denies trauma to head/neck, blood thinner use, loss of consciousness.  Patient was not able to get up and ambulate after accident.  She is complaining of right hip pain.  Denies symptoms prior to fall such as dizziness, lightheadedness, blacking out.  She currently lives alone but has her daughter-in-law and son come and help her out.  Currently denies chest pain, shortness of breath, abdominal pain, nausea/vomiting/diarrhea, headache, dizziness, lightheadedness, urinary/vaginal symptoms, fever, chills, night sweats.  Home Medications Prior to Admission medications   Medication Sig Start Date End Date Taking? Authorizing Provider  aspirin EC 81 MG tablet Take 81 mg by mouth daily.   Yes [provider]  Calcium Carbonate-Vitamin D (CALTRATE 600+D PO) Take 1 tablet by mouth 2 (two) times daily.   Yes [provider]  ibuprofen (ADVIL) 600 MG tablet Take 1 tablet (600 mg total) by mouth every 6 (six) hours as needed for mild pain. 01/03/22  Yes Shahmehdi, Seyed A, MD  SYNJARDY 12.02-999 MG TABS Take 1 tablet by mouth 2 (two) times daily. 01/19/22  Yes Gerlene Fee, NP      Allergies    Contrast media [iodinated contrast media] and Metrizamide    Review of Systems   Review of Systems  Constitutional:  Negative for chills and fever.  HENT:  Negative for ear pain and sore throat.   Eyes:  Negative for pain and visual disturbance.  Respiratory:  Negative for cough and  shortness of breath.   Cardiovascular:  Negative for chest pain and palpitations.  Gastrointestinal:  Negative for abdominal pain and vomiting.  Genitourinary:  Negative for dysuria and hematuria.  Musculoskeletal:  Negative for back pain, neck pain and neck stiffness.       Right hip pain  Skin:  Negative for color change and rash.  Neurological:  Negative for seizures and syncope.  All other systems reviewed and are negative.   Physical Exam Updated Vital Signs BP 130/81   Pulse 88   Temp 98.2 F (36.8 C) (Oral)   Resp 15   Ht '5\' 5"'$  (3.016 m)   Wt 59 kg   SpO2 94%   BMI 21.63 kg/m  Physical Exam Vitals and nursing note reviewed.  Constitutional:      General: She is not in acute distress.    Appearance: She is well-developed.  HENT:     Head: Normocephalic and atraumatic.     Comments: No signs of facial trauma or scalp/head trauma.  No raccoon eyes, battle sign, hemotympanum, septal hematoma.    Mouth/Throat:     Mouth: Mucous membranes are moist.     Pharynx: Oropharynx is clear.  Eyes:     General:        Right eye: No discharge.        Left eye: No discharge.     Extraocular Movements: Extraocular movements intact.     Conjunctiva/sclera: Conjunctivae normal.     Pupils: Pupils  are equal, round, and reactive to light.  Cardiovascular:     Rate and Rhythm: Normal rate and regular rhythm.     Pulses: Normal pulses.     Heart sounds: No murmur heard. Pulmonary:     Effort: Pulmonary effort is normal. No respiratory distress.     Breath sounds: Normal breath sounds.  Abdominal:     Palpations: Abdomen is soft.     Tenderness: There is no abdominal tenderness.  Musculoskeletal:     Cervical back: Neck supple.     Comments: Limited range of motion of right hip secondary to pain.  Right leg is externally rotated and shortened compared to left.  Posterior tibial pulses are full intact bilaterally.  Strength 5 out of 5 for ankle flexion and extension.  No sensory  deficits along major nerve region of upper or lower extremities.  Tenderness to palpation of proximal femur of right lower extremity.  No tenderness to palpation along upper extremities.  Patient has full range of motion of bilateral shoulders, elbows, wrist, fingers.  No tenderness to palpation of bilateral knee, left hip, bilateral feet/ankle.  No overlying skin abnormalities noted.  Skin:    General: Skin is warm and dry.     Capillary Refill: Capillary refill takes less than 2 seconds.  Neurological:     Mental Status: She is alert.     Coordination: Finger-Nose-Finger Test normal.     Comments: Cranial nerves III through XII grossly intact.  No facial symmetry or dysarthria.  EOMs without defect.  Heel-to-shin limited secondary to right hip pain.  Psychiatric:        Mood and Affect: Mood normal.     ED Results / Procedures / Treatments   Labs (all labs ordered are listed, but only abnormal results are displayed) Labs Reviewed  COMPREHENSIVE METABOLIC PANEL - Abnormal; Notable for the following components:      Result Value   Glucose, Bld 155 (*)    Total Protein 6.4 (*)    All other components within normal limits  CBC WITH DIFFERENTIAL/PLATELET - Abnormal; Notable for the following components:   WBC 10.8 (*)    Neutro Abs 8.4 (*)    All other components within normal limits  CBG MONITORING, ED - Abnormal; Notable for the following components:   Glucose-Capillary 220 (*)    All other components within normal limits  COMPREHENSIVE METABOLIC PANEL  CBC    EKG None  Radiology DG Knee Complete 4 Views Right  Result Date: 04/19/2022 CLINICAL DATA:  Knee pain after fall EXAM: RIGHT KNEE - COMPLETE 4+ VIEW COMPARISON:  None Available. FINDINGS: No fracture or malalignment. Mild tricompartment arthritis of the knee. No sizable effusion. There is mild lateral deviation of the patella. IMPRESSION: 1. No fracture is seen. 2. Mild lateral deviation of the patella 3. Mild  tricompartment arthritis Electronically Signed   By: Donavan Foil M.D.   On: 04/19/2022 16:49   DG Chest Port 1 View  Result Date: 04/19/2022 CLINICAL DATA:  Right hip fracture. EXAM: PORTABLE CHEST 1 VIEW COMPARISON:  December 30, 2021. FINDINGS: The heart size and mediastinal contours are within normal limits. Both lungs are clear. The visualized skeletal structures are unremarkable. IMPRESSION: No active disease. Electronically Signed   By: Marijo Conception M.D.   On: 04/19/2022 16:20   DG Hip Unilat W or Wo Pelvis 2-3 Views Right  Result Date: 04/19/2022 CLINICAL DATA:  Right hip pain after fall today. EXAM: DG HIP (WITH OR  WITHOUT PELVIS) 2-3V RIGHT COMPARISON:  None Available. FINDINGS: Moderately displaced proximal right femoral neck fracture is noted. Also noted is moderately displaced fracture involving the left superior pubic ramus. IMPRESSION: Moderately displaced proximal right femoral neck fracture. Moderately displaced left superior pubis ramus fracture. Electronically Signed   By: Marijo Conception M.D.   On: 04/19/2022 16:19    Procedures Procedures    Medications Ordered in ED Medications  insulin aspart (novoLOG) injection 0-6 Units (has no administration in time range)  insulin aspart (novoLOG) injection 0-5 Units (has no administration in time range)  aspirin EC tablet 81 mg (has no administration in time range)  sodium chloride flush (NS) 0.9 % injection 3 mL (has no administration in time range)  sodium chloride flush (NS) 0.9 % injection 3 mL (has no administration in time range)  sodium chloride flush (NS) 0.9 % injection 3 mL (has no administration in time range)  0.9 %  sodium chloride infusion (has no administration in time range)  acetaminophen (TYLENOL) tablet 650 mg (has no administration in time range)    Or  acetaminophen (TYLENOL) suppository 650 mg (has no administration in time range)  traZODone (DESYREL) tablet 50 mg (has no administration in time range)   polyethylene glycol (MIRALAX / GLYCOLAX) packet 17 g (has no administration in time range)  bisacodyl (DULCOLAX) suppository 10 mg (has no administration in time range)  ondansetron (ZOFRAN) tablet 4 mg (has no administration in time range)    Or  ondansetron (ZOFRAN) injection 4 mg (has no administration in time range)  heparin injection 5,000 Units (has no administration in time range)  multivitamin with minerals tablet 1 tablet (has no administration in time range)  senna-docusate (Senokot-S) tablet 2 tablet (has no administration in time range)  oxyCODONE (Oxy IR/ROXICODONE) immediate release tablet 5 mg (has no administration in time range)  fentaNYL (SUBLIMAZE) injection 25 mcg (has no administration in time range)  methocarbamol (ROBAXIN) tablet 500 mg (has no administration in time range)  LORazepam (ATIVAN) injection 0.5 mg (has no administration in time range)    ED Course/ Medical Decision Making/ A&P Clinical Course as of 04/19/22 1830  Tue Apr 19, 2022  [place] Dr. Marcelino Scot regarding the patient.  He agreed to see the patient in [facility] tomorrow for surgery and have medicine admit. [CR]  [id number] Talk to Dr. Denton Brick of hospital medicine, and he agreed with admission plan and hospital transfer. [CR]    Clinical Course User Index [CR] Wilnette Kales, PA                           Medical Decision Making Amount and/or Complexity of Data Reviewed Radiology: ordered.   This patient presents to the ED for concern of fall, this involves an extensive number of treatment options, and is a complaint that carries with it a high risk of complications and morbidity.  The differential diagnosis includes fracture, dislocation, exsanguination, sepsis, syncope   Co morbidities that complicate the patient evaluation  Diabetes mellitus, poor hearing    Lab Tests:  I Ordered, and personally interpreted labs.  The pertinent results include: WBC of 10.8   Imaging Studies  ordered:  I ordered imaging studies including chest x-ray, pelvis with right hip, right knee x-ray I independently visualized and interpreted imaging which showed  Chest x-ray: No acute abnormality Pelvis with right hip: Moderately displaced proximal right femoral neck fracture, moderately displaced left superior pubic ramus fracture.  Right knee x-ray: No acute abnormality. I agree with the radiologist interpretation  Cardiac Monitoring: / EKG:  The patient was maintained on a cardiac monitor.  I personally viewed and interpreted the cardiac monitored which showed an underlying rhythm of: Sinus rhythm   Consultations Obtained:  See ED course for consultations  Problem List / ED Course / Critical interventions / Medication management  Left superior pubic ramus fracture and right femoral neck fracture Reevaluation of the patient showed that the patient improved I have reviewed the patients home medicines and have made adjustments as needed   Social Determinants of Health:  Patient lives alone and ambulates with difficulty with walker assistance.   Test / Admission - Considered:  Right femoral neck fracture, left superior pubic ramus fracture Vitals signs  within normal range and stable throughout visit. Laboratory/imaging studies significant for: See above.  Consulted orthopedics and hospital medicine.  They agreed with admission and transfer to [facility] orthopedics will operate on the patient.  Treatment plan was explained to patient, and she knowledge understanding and was agreeable. Fall seems to be mechanical.  Further work-up deemed necessary at this point.  Necessary laboratory studies for hospital admission ordered.  The patient appears reasonably stabilized for admission considering the current resources, flow, and capabilities available in the ED at this time, and I doubt any other [facility] requiring further screening and/or treatment in the ED prior to admission.           Final Clinical Impression(s) / ED Diagnoses Final diagnoses:  Fall, initial encounter  Closed displaced fracture of right femoral neck (HCC)  Closed fracture of superior ramus of left pubis, initial encounter [facility])    Rx / DC Orders ED Discharge Orders     None         Wilnette Kales, [place] 04/19/22 1830    Fredia Sorrow, MD 04/30/22 2325

## 2022-04-20 DIAGNOSIS — S72001A Fracture of unspecified part of neck of right femur, initial encounter for closed fracture: Secondary | ICD-10-CM | POA: Diagnosis not present

## 2022-04-20 DIAGNOSIS — E1149 Type 2 diabetes mellitus with other diabetic neurological complication: Secondary | ICD-10-CM

## 2022-04-20 DIAGNOSIS — F0390 Unspecified dementia without behavioral disturbance: Secondary | ICD-10-CM | POA: Diagnosis not present

## 2022-04-20 DIAGNOSIS — S329XXD Fracture of unspecified parts of lumbosacral spine and pelvis, subsequent encounter for fracture with routine healing: Secondary | ICD-10-CM | POA: Diagnosis not present

## 2022-04-20 LAB — COMPREHENSIVE METABOLIC PANEL
ALT: 16 U/L (ref 0–44)
AST: 19 U/L (ref 15–41)
Albumin: 3.8 g/dL (ref 3.5–5.0)
Alkaline Phosphatase: 49 U/L (ref 38–126)
Anion gap: 10 (ref 5–15)
BUN: 17 mg/dL (ref 8–23)
CO2: 26 mmol/L (ref 22–32)
Calcium: 8.8 mg/dL — ABNORMAL LOW (ref 8.9–10.3)
Chloride: 100 mmol/L (ref 98–111)
Creatinine, Ser: 0.82 mg/dL (ref 0.44–1.00)
GFR, Estimated: >60 mL/min (ref >60)
Glucose, Bld: 132 mg/dL — ABNORMAL HIGH (ref 70–99)
Potassium: 3.9 mmol/L (ref 3.5–5.1)
Sodium: 136 mmol/L (ref 135–145)
Total Bilirubin: 1.1 mg/dL (ref 0.3–1.2)
Total Protein: 6.5 g/dL (ref 6.5–8.1)

## 2022-04-20 LAB — CBC
HCT: 41.6 % (ref 36.0–46.0)
Hemoglobin: 13.1 g/dL (ref 12.0–15.0)
MCH: 28.8 pg (ref 26.0–34.0)
MCHC: 31.5 g/dL (ref 30.0–36.0)
MCV: 91.4 fL (ref 80.0–100.0)
Platelets: 175 10*3/uL (ref 150–400)
RBC: 4.55 MIL/uL (ref 3.87–5.11)
RDW: 13.4 % (ref 11.5–15.5)
WBC: 5.5 10*3/uL (ref 4.0–10.5)
nRBC: 0 % (ref 0.0–0.2)

## 2022-04-20 LAB — CBG MONITORING, ED
Glucose-Capillary: 149 mg/dL — ABNORMAL HIGH (ref 70–99)
Glucose-Capillary: 154 mg/dL — ABNORMAL HIGH (ref 70–99)
Glucose-Capillary: 112 mg/dL — ABNORMAL HIGH (ref 70–99)
Glucose-Capillary: 107 mg/dL — ABNORMAL HIGH (ref 70–99)

## 2022-04-20 LAB — GLUCOSE, CAPILLARY: Glucose-Capillary: 131 mg/dL — ABNORMAL HIGH (ref 70–99)

## 2022-04-20 NOTE — Progress Notes (Signed)
PROGRESS NOTE    Patient: Candice Ray                            PCP: Jani Gravel, MD                    DOB: [birth year]            DOA: 04/19/2022 OVZ:[id number]             DOS: 04/20/2022, 11:33 AM   LOS: 1 day   Date of Service: The patient was seen and examined on 04/20/2022  Subjective:   The patient was seen and examined this morning. Stable at this time. Still complaining of pain in her hip  Otherwise no issues overnight .  Brief Narrative:   Elliot Simoneaux  is a 88 y.o. female with Pmhx relevant for DM2, dementia , schizophrenia, hearing loss brought in by [facility] for c/o fall; As per pts son Mishika Flippen) pt decided to walk down the hallway w/o her walker------she stumbled and fell---Daughter in law witnessed the fall,-- pt denies hitting her head, daughter in law concurs She is a diabetic, she is [facility] and forgetful ,also has schizophrenia/psychosis as per son.  - Hip and pelvic x-rays demonstrate-- Ortho consulted at [facility]  1)Moderately displaced proximal right femoral neck fracture. 2)Moderately displaced left superior pubis ramus fracture. -WBC 10.8 Hgb 13.1, platelets 204 -Sodium 136 glucose 155 creatinine 0.82 -LFTs are not elevated -EKG with sinus rhythm and possible left anterior fascicular block -Chest x-ray and right knee x-rays without acute findings -EDP discussed case with on-call orthopedic surgeon Dr. Marcelino Scot who recommends transfer to [facility] for ORIF - Patient is a poor historian so most of the history is obtained from patient's son Jori Moll who is at bedside     Assessment & Plan:   Principal Problem:   Rt Hip fracture ([place]) Active Problems:   Pelvic fracture/Lt Superior Pubis Ramus Fracture   DM (diabetes mellitus), type 2 with neurological complications ([place])   Dementia without behavioral disturbance (HCC)   Schizophrenia ([place])    1)Rt Hip Fx-  - Pending transfer to [facility], - NPO   -Hip and pelvic x-rays demonstrates moderately displaced  proximal right femoral neck fracture and Moderately displaced left superior pubis ramus fracture. -As per EDP Dr. Marcelino Scot the orthopedic surgeon on-call accepts this patient for consultation -Please notify Dr. Marcelino Scot when patient arrives at [facility] -IV fentanyl and p.o. oxycodone and methocarbamol as prescribed for pain control    2)Dementia/schizophrenia- -- high risk for hospital delirium/psychosis --- Okay to use lorazepam and or trazodone as needed restlessness or agitation   3)DM2 NPO, SSI  -recent A1c 8.4 reflecting uncontrolled diabetes PTA -Hold SYNJARDY- -Use Novolog/Humalog Sliding scale insulin with Accu-Cheks/Fingersticks as ordered    4) social/ethics- -plan of care and advanced directive discussed with patient's son Jori Moll -Patient is a DNR/DNI,  No limitations to treatment otherwise   Disposition/Need for in-Hospital Stay - patient unable to be discharged at this time due to --- right hip fracture requiring operative fixation at [facility] by Dr. Marcelino Scot   Skin Assessment: I have examined the patient's skin and I agree with the wound assessment as performed by wound care team As outlined belowe: Pressure Injury 12/31/21 Sacrum Right;Left Stage 2 -  Partial thickness loss of dermis presenting as a shallow open injury with a red, pink wound bed without slough. (Active)  12/31/21 0139  Location: Sacrum  Location Orientation: Right;Left  Staging: Stage 2 -  Partial thickness loss of dermis presenting as a shallow open injury with a red, pink wound bed without slough.  Wound Description (Comments):   Present on Admission: Yes   -----------------------------------------------------------------------------------------------------------------------------------------  DVT prophylaxis:  heparin injection 5,000 Units Start: 04/20/22 2200 SCDs Start: 04/19/22 1750 Place TED hose Start: 04/19/22 1750   Code Status:   Code Status: DNR  Family Communication:  discussed with patient's son Jori Moll No family member present at this time  Admission status:   Status is: Inpatient Remains inpatient appropriate because: Needing surgical intervention     Procedures:   No admission procedures for hospital encounter.   Antimicrobials:  Anti-infectives (From admission, onward)    None        Medication:   aspirin EC  81 mg Oral Q breakfast   heparin  5,000 Units Subcutaneous Q8H   insulin aspart  0-5 Units Subcutaneous QHS   insulin aspart  0-6 Units Subcutaneous TID WC   methocarbamol  500 mg Oral TID   multivitamin with minerals  1 tablet Oral Daily   [START ON 04/21/2022] polyethylene glycol  17 g Oral Daily   senna-docusate  2 tablet Oral QHS   sodium chloride flush  3 mL Intravenous Q12H   sodium chloride flush  3 mL Intravenous Q12H    sodium chloride, acetaminophen **OR** acetaminophen, bisacodyl, fentaNYL (SUBLIMAZE) injection, LORazepam, ondansetron **OR** ondansetron (ZOFRAN) IV, oxyCODONE, sodium chloride flush, traZODone   Objective:   Vitals:   04/20/22 0230 04/20/22 0300 04/20/22 0645 04/20/22 0930  BP: 117/72 129/67 122/68 131/85  Pulse: 92 88 96 99  Resp: (!) 21 (!) 23 19 (!) 23  Temp:   98.3 F (36.8 C)   TempSrc:   Oral   SpO2: 94% 93% 95% 95%  Weight:      Height:       No intake or output data in the 24 hours ending 04/20/22 1133 Filed Weights   04/19/22 1501  Weight: 59 kg     Examination:   Physical Exam  Constitution: Pleasantly confused,, mild distress, otherwise appears calm and comfortable  Psychiatric:   Normal and stable mood and affect, cognition intact,   HEENT:        Normocephalic, PERRL, otherwise with in Normal limits  Chest:         Chest symmetric Cardio vascular:  S1/S2, RRR, No murmure, No Rubs or Gallops  pulmonary: Clear to auscultation bilaterally, respirations unlabored, negative wheezes / crackles Abdomen: Soft, non-tender, non-distended, bowel sounds,no masses, no  organomegaly Muscular skeletal: Pain, limited range of motion due to pain  Limited exam - in bed, able to move extremities with exception of right leg Neuro: CNII-XII intact. , normal motor and sensation, reflexes intact  Extremities: No pitting edema lower extremities, +2 pulses  Skin: Dry, warm to touch, negative for any Rashes, No open wounds Wounds: per nursing documentation   ------------------------------------------------------------------------------------------------------------------------------------------    LABs:     Latest Ref Rng & Units 04/20/2022    3:23 AM 04/19/2022    4:53 PM 12/31/2021    5:38 AM  CBC  WBC 4.0 - 10.5 K/uL 5.5  10.8  9.3   Hemoglobin 12.0 - 15.0 g/dL 13.1  13.1  12.9   Hematocrit 36.0 - 46.0 % 41.6  39.7  41.1   Platelets 150 - 400 K/uL 175  204  160       Latest Ref Rng & Units 04/20/2022  3:23 AM 04/19/2022    4:53 PM 01/03/2022    4:54 AM  CMP  Glucose 70 - 99 mg/dL 132  155  182   BUN 8 - 23 mg/dL '17  19  12   '$ Creatinine 0.44 - 1.00 mg/dL 0.82  0.82  0.51   Sodium 135 - 145 mmol/L 136  136  137   Potassium 3.5 - 5.1 mmol/L 3.9  4.1  4.0   Chloride 98 - 111 mmol/L 100  101  100   CO2 22 - 32 mmol/L 26  28  32   Calcium 8.9 - 10.3 mg/dL 8.8  9.3  8.3   Total Protein 6.5 - 8.1 g/dL 6.5  6.4    Total Bilirubin 0.3 - 1.2 mg/dL 1.1  0.8    Alkaline Phos 38 - 126 U/L 49  50    AST 15 - 41 U/L 19  21    ALT 0 - 44 U/L 16  14         Micro Results No results found for this or any previous visit (from the past 240 hour(s)).  Radiology Reports DG Knee Complete 4 Views Right  Result Date: 04/19/2022 CLINICAL DATA:  Knee pain after fall EXAM: RIGHT KNEE - COMPLETE 4+ VIEW COMPARISON:  None Available. FINDINGS: No fracture or malalignment. Mild tricompartment arthritis of the knee. No sizable effusion. There is mild lateral deviation of the patella. IMPRESSION: 1. No fracture is seen. 2. Mild lateral deviation of the patella 3. Mild  tricompartment arthritis Electronically Signed   By: Donavan Foil M.D.   On: 04/19/2022 16:49   DG Chest Port 1 View  Result Date: 04/19/2022 CLINICAL DATA:  Right hip fracture. EXAM: PORTABLE CHEST 1 VIEW COMPARISON:  December 30, 2021. FINDINGS: The heart size and mediastinal contours are within normal limits. Both lungs are clear. The visualized skeletal structures are unremarkable. IMPRESSION: No active disease. Electronically Signed   By: Marijo Conception M.D.   On: 04/19/2022 16:20   DG Hip Unilat W or Wo Pelvis 2-3 Views Right  Result Date: 04/19/2022 CLINICAL DATA:  Right hip pain after fall today. EXAM: DG HIP (WITH OR WITHOUT PELVIS) 2-3V RIGHT COMPARISON:  None Available. FINDINGS: Moderately displaced proximal right femoral neck fracture is noted. Also noted is moderately displaced fracture involving the left superior pubic ramus. IMPRESSION: Moderately displaced proximal right femoral neck fracture. Moderately displaced left superior pubis ramus fracture. Electronically Signed   By: Marijo Conception M.D.   On: 04/19/2022 16:19    SIGNED: Deatra James, MD, FHM. [facility],  Pager (please use [email or web address] to page/text) Please use Epic Secure Chat for non-urgent communication (7AM-7PM)  If 7PM-7AM, please contact night-coverage [email or web address], 04/20/2022, 11:33 AM

## 2022-04-20 NOTE — Progress Notes (Signed)
[facility]uma [facility]   OTS aware of patient  Awaiting transfer to [facility]   Displaced R femoral neck fracture that will require R hip hemiarthroplasty   Please let us know when she arrives to [facility] so we can determine timing of surgery   Jari Pigg, PA-C [phone] (C) 04/20/2022, 11:01 AM  [facility] [place] [phone] Candice Ray (F)       Patient ID: Candice Ray, female   DOB: Sep 30, 1932, 88 y.o.   MRN: [id number]

## 2022-04-21 DIAGNOSIS — S72001D Fracture of unspecified part of neck of right femur, subsequent encounter for closed fracture with routine healing: Secondary | ICD-10-CM | POA: Diagnosis not present

## 2022-04-21 DIAGNOSIS — S72001A Fracture of unspecified part of neck of right femur, initial encounter for closed fracture: Secondary | ICD-10-CM | POA: Diagnosis not present

## 2022-04-21 LAB — GLUCOSE, CAPILLARY
Glucose-Capillary: 157 mg/dL — ABNORMAL HIGH (ref 70–99)
Glucose-Capillary: 154 mg/dL — ABNORMAL HIGH (ref 70–99)
Glucose-Capillary: 142 mg/dL — ABNORMAL HIGH (ref 70–99)
Glucose-Capillary: 189 mg/dL — ABNORMAL HIGH (ref 70–99)

## 2022-04-21 LAB — SURGICAL PCR SCREEN
MRSA, PCR: NEGATIVE
Staphylococcus aureus: NEGATIVE

## 2022-04-21 MED ORDER — CEFAZOLIN SODIUM-DEXTROSE 2-4 GM/100ML-% IV SOLN
2.0000 g | Freq: Once | INTRAVENOUS | Status: AC
  Administered 2022-04-22: 2 g via INTRAVENOUS
  Filled 2022-04-21: qty 100

## 2022-04-21 NOTE — Plan of Care (Signed)

## 2022-04-21 NOTE — Progress Notes (Signed)
[facility]                                                                               Candice Ray, is a 88 y.o. female, DOB - [birth year], GYJ:[id number] Admit date - 04/19/2022    Outpatient Primary MD for the patient is Jani Gravel, MD  LOS - 2  days    Brief summary    Candice Ray  is a 88 y.o. female with Pmhx relevant for DM2, dementia , schizophrenia, hearing loss brought in by EMS  for a fall,.  As per pts son Candice Ray) pt decided to walk down the hallway w/o her walker------she stumbled and fell---Daughter in law witnessed the fall,-- pt denies hitting her head,. She is a diabetic, she is HOH and forgetful ,also has schizophrenia/psychosis as per son.   Assessment & Plan    Assessment and Plan:    Proximal Right femoral neck fracture, Displaced left superior pubis ramus fracture:  Pain control. Plan for OR in am.  Ortho on board and plan for OR in am.  Therapy eval after surgery.     Dementia and Schizophrenia:  - no agitation.  Prn lorazepam .    Type 2 DM CBG (last 3)  Recent Labs    04/20/22 2228 04/21/22 0733 04/21/22 1108  GLUCAP 131* 157* 154*   Resume SSI.  Last A1cis 8.4%.       Estimated body mass index is 21.63 kg/m as calculated from the following:   Height as of this encounter: '5\' 5"'$  (1.651 m).   Weight as of this encounter: 59 kg.  Code Status: DNR DVT Prophylaxis:  heparin injection 5,000 Units Start: 04/20/22 2200 SCDs Start: 04/19/22 1750 Place TED hose Start: 04/19/22 1750   Level of Care: Level of care: Med-Surg Family Communication: None at bedside.   Disposition Plan:     Remains inpatient appropriate OR in am.   Procedures:  None.   Consultants:   Orthopedics.   Antimicrobials:   Anti-infectives (From admission, onward)    Start     Dose/Rate Route Frequency Ordered Stop   04/22/22 1200  ceFAZolin (ANCEF) IVPB 2g/100 mL premix        2 g 200 mL/hr over 30 Minutes Intravenous  Once  04/21/22 1208          Medications  Scheduled Meds:  aspirin EC  81 mg Oral Q breakfast   heparin  5,000 Units Subcutaneous Q8H   insulin aspart  0-5 Units Subcutaneous QHS   insulin aspart  0-6 Units Subcutaneous TID WC   methocarbamol  500 mg Oral TID   multivitamin with minerals  1 tablet Oral Daily   polyethylene glycol  17 g Oral Daily   senna-docusate  2 tablet Oral QHS   sodium chloride flush  3 mL Intravenous Q12H   sodium chloride flush  3 mL Intravenous Q12H   Continuous Infusions:  sodium chloride     [START ON 04/22/2022]  ceFAZolin (ANCEF) IV     PRN Meds:.sodium chloride, acetaminophen **OR** acetaminophen, bisacodyl, fentaNYL (SUBLIMAZE) injection, LORazepam, ondansetron **OR** ondansetron (ZOFRAN) IV, oxyCODONE, sodium chloride flush, traZODone    Subjective:  Candice Ray was seen and examined today.  No events overnight.  Objective:   Vitals:   04/20/22 2133 04/21/22 0415 04/21/22 0738 04/21/22 1302  BP: 124/70 112/62 118/64 (!) 107/56  Pulse: (!) 103 99 98 (!) 105  Resp: '19 18 16   '$ Temp: 98.7 F (37.1 C) 97.7 F (36.5 C) 98.4 F (36.9 C) (!) 97.5 F (36.4 C)  TempSrc:   Oral Oral  SpO2: 96% 97% 98% 98%  Weight:      Height:       No intake or output data in the 24 hours ending 04/21/22 1446 Filed Weights   04/19/22 1501  Weight: 59 kg     Exam. General exam: Appears calm and comfortable  Respiratory system: Clear to auscultation. Respiratory effort normal. Cardiovascular system: S1 & S2 heard, RRR. No JVD, . No pedal edema. Gastrointestinal system: Abdomen is nondistended, soft and nontender. Normal bowel sounds heard. Central nervous system: Alert and oriented. No focal neurological deficits. Extremities: Symmetric 5 x 5 power. Skin: No rashes, lesions or ulcers Psychiatry: Mood & affect appropriate.     Data Reviewed:  I have personally reviewed following labs and imaging studies   CBC Lab Results  Component Value Date    WBC 5.5 04/20/2022   RBC 4.55 04/20/2022   HGB 13.1 04/20/2022   HCT 41.6 04/20/2022   MCV 91.4 04/20/2022   MCH 28.8 04/20/2022   PLT 175 04/20/2022   MCHC 31.5 04/20/2022   RDW 13.4 04/20/2022   LYMPHSABS 1.4 04/19/2022   MONOABS 1.0 04/19/2022   EOSABS 0.1 04/19/2022   BASOSABS 0.0 61/95/0932     Last metabolic panel Lab Results  Component Value Date   NA 136 04/20/2022   K 3.9 04/20/2022   CL 100 04/20/2022   CO2 26 04/20/2022   BUN 17 04/20/2022   CREATININE 0.82 04/20/2022   GLUCOSE 132 (H) 04/20/2022   GFRNONAA >60 04/20/2022   GFRAA >90 05/18/2012   CALCIUM 8.8 (L) 04/20/2022   PROT 6.5 04/20/2022   ALBUMIN 3.8 04/20/2022   BILITOT 1.1 04/20/2022   ALKPHOS 49 04/20/2022   AST 19 04/20/2022   ALT 16 04/20/2022   ANIONGAP 10 04/20/2022    CBG (last 3)  Recent Labs    04/20/22 2228 04/21/22 0733 04/21/22 1108  GLUCAP 131* 157* 154*      Coagulation Profile: No results for input(s): "INR", "PROTIME" in the last 168 hours.   Radiology Studies: DG Knee Complete 4 Views Right  Result Date: 04/19/2022 CLINICAL DATA:  Knee pain after fall EXAM: RIGHT KNEE - COMPLETE 4+ VIEW COMPARISON:  None Available. FINDINGS: No fracture or malalignment. Mild tricompartment arthritis of the knee. No sizable effusion. There is mild lateral deviation of the patella. IMPRESSION: 1. No fracture is seen. 2. Mild lateral deviation of the patella 3. Mild tricompartment arthritis Electronically Signed   By: Donavan Foil M.D.   On: 04/19/2022 16:49   DG Chest Port 1 View  Result Date: 04/19/2022 CLINICAL DATA:  Right hip fracture. EXAM: PORTABLE CHEST 1 VIEW COMPARISON:  December 30, 2021. FINDINGS: The heart size and mediastinal contours are within normal limits. Both lungs are clear. The visualized skeletal structures are unremarkable. IMPRESSION: No active disease. Electronically Signed   By: Marijo Conception M.D.   On: 04/19/2022 16:20   DG Hip Unilat W or Wo Pelvis 2-3 Views  Right  Result Date: 04/19/2022 CLINICAL DATA:  Right hip pain after fall today. EXAM: DG HIP (WITH  OR WITHOUT PELVIS) 2-3V RIGHT COMPARISON:  None Available. FINDINGS: Moderately displaced proximal right femoral neck fracture is noted. Also noted is moderately displaced fracture involving the left superior pubic ramus. IMPRESSION: Moderately displaced proximal right femoral neck fracture. Moderately displaced left superior pubis ramus fracture. Electronically Signed   By: Marijo Conception M.D.   On: 04/19/2022 16:19       Hosie Poisson M.D. [facility] 04/21/2022, 2:46 PM  Available via Epic secure chat 7am-7pm After 7 pm, please refer to night coverage provider listed on amion.

## 2022-04-21 NOTE — Consult Note (Cosign Needed)
Orthopaedic Trauma Service (OTS) Consult   Patient ID: Candice Ray MRN: [id number] DOB/AGE: [birth year] 88 y.o.   Reason for Consult: Right femoral neck fracture Referring Physician: Roxan Hockey, MD (hospitalist)   HPI: Candice Ray is an 88 y.o. female with history of diabetes, dementia, hard of hearing who sustained a ground-level fall on 04/19/2021.  Patient was using a walker and fell hallway.  This is according to her son.  Witnessed by daughter-in-law.  Patient was brought to [facility] where she was found to have a right femoral neck fracture.  Orthopedics was contacted regarding this.  There was no coverage locally so they contacted [facility].  Orthopedic trauma service is on-call the day that she presented.  We requested that she be transferred to [facility] for definitive treatment.  Patient arrived 04/20/2022 at 2300 hrs.  She is seen and evaluated today.  She is in bed hard of hearing, confused but pleasant.  She was sleeping upon evaluation.  Difficult to gather all medical information due to her dementia.  No other acute findings or injuries noted.  She does note some mild right hip pain when trying to move.  Uses a walker at baseline  Past Medical History:  Diagnosis Date   Diabetes mellitus    HOH (hard of hearing)    Poor historian    Sinus trouble     Past Surgical History:  Procedure Laterality Date   COLONOSCOPY  2007   Dr. Gala Romney: tubular adenomas/rectal   COLONOSCOPY N/A 01/06/2014   HQP:RFFMBWG diverticulosis. Colonic polyp-removed as described above. Status post segmental biopsy and stool collection for culture   HERNIA REPAIR     umbilical, [place]   HYSTEROSCOPY WITH D & C  05/23/2012   Procedure: DILATATION AND CURETTAGE /HYSTEROSCOPY;  Surgeon: Florian Buff, MD;  Location: [facility];  Service: Gynecology;  Laterality: N/A;   POLYPECTOMY  05/23/2012   Procedure: POLYPECTOMY;  Surgeon: Florian Buff, MD;  Location: [facility];   Service: Gynecology;  Laterality: N/A;  endometrial polypectomy    Family History  Problem Relation Age of Onset   Colon cancer Neg Hx     Social History:  reports that she has never smoked. She has never used smokeless tobacco. She reports that she does not drink alcohol and does not use drugs.  Allergies:  Allergies  Allergen Reactions   Contrast Media [Iodinated Contrast Media] Swelling   Metrizamide Swelling    Medications: I have reviewed the patient's current medications. Current Meds  Medication Sig   aspirin EC 81 MG tablet Take 81 mg by mouth daily.   Calcium Carbonate-Vitamin D (CALTRATE 600+D PO) Take 1 tablet by mouth 2 (two) times daily.   ibuprofen (ADVIL) 600 MG tablet Take 1 tablet (600 mg total) by mouth every 6 (six) hours as needed for mild pain.   SYNJARDY 12.02-999 MG TABS Take 1 tablet by mouth 2 (two) times daily.     Results for orders placed or performed during the hospital encounter of 04/19/22 (from the past 48 hour(s))  CBG monitoring, ED     Status: Abnormal   Collection Time: 04/19/22  3:00 PM  Result Value Ref Range   Glucose-Capillary 220 (H) 70 - 99 mg/dL    Comment: Glucose reference range applies only to samples taken after fasting for at least 8 hours.  Comprehensive metabolic panel     Status: Abnormal   Collection Time: 04/19/22  4:53 PM  Result Value Ref Range   Sodium 136 135 - 145 mmol/L   Potassium 4.1 3.5 - 5.1 mmol/L   Chloride 101 98 - 111 mmol/L   CO2 28 22 - 32 mmol/L   Glucose, Bld 155 (H) 70 - 99 mg/dL    Comment: Glucose reference range applies only to samples taken after fasting for at least 8 hours.   BUN 19 8 - 23 mg/dL   Creatinine, Ser 0.82 0.44 - 1.00 mg/dL   Calcium 9.3 8.9 - 10.3 mg/dL   Total Protein 6.4 (L) 6.5 - 8.1 g/dL   Albumin 3.8 3.5 - 5.0 g/dL   AST 21 15 - 41 U/L   ALT 14 0 - 44 U/L   Alkaline Phosphatase 50 38 - 126 U/L   Total Bilirubin 0.8 0.3 - 1.2 mg/dL   GFR, Estimated >60 >60 mL/min     Comment: (NOTE) Calculated using the CKD-EPI Creatinine Equation (2021)    Anion gap 7 5 - 15    Comment: Performed at [facility], [address], [place], [place] [address]  CBC with Differential     Status: Abnormal   Collection Time: 04/19/22  4:53 PM  Result Value Ref Range   WBC 10.8 (H) 4.0 - 10.5 K/uL   RBC 4.45 3.87 - 5.11 MIL/uL   Hemoglobin 13.1 12.0 - 15.0 g/dL   HCT 39.7 36.0 - 46.0 %   MCV 89.2 80.0 - 100.0 fL   MCH 29.4 26.0 - 34.0 pg   MCHC 33.0 30.0 - 36.0 g/dL   RDW 13.4 11.5 - 15.5 %   Platelets 204 150 - 400 K/uL   nRBC 0.0 0.0 - 0.2 %   Neutrophils Relative % 77 %   Neutro Abs 8.4 (H) 1.7 - 7.7 K/uL   Lymphocytes Relative 13 %   Lymphs Abs 1.4 0.7 - 4.0 K/uL   Monocytes Relative 9 %   Monocytes Absolute 1.0 0.1 - 1.0 K/uL   Eosinophils Relative 1 %   Eosinophils Absolute 0.1 0.0 - 0.5 K/uL   Basophils Relative 0 %   Basophils Absolute 0.0 0.0 - 0.1 K/uL   Immature Granulocytes 0 %   Abs Immature Granulocytes 0.04 0.00 - 0.07 K/uL    Comment: Performed at [facility], [address] [place], [place] [address]  CBG monitoring, ED     Status: Abnormal   Collection Time: 04/19/22  9:23 PM  Result Value Ref Range   Glucose-Capillary 257 (H) 70 - 99 mg/dL    Comment: Glucose reference range applies only to samples taken after fasting for at least 8 hours.  Comprehensive metabolic panel     Status: Abnormal   Collection Time: 04/20/22  3:23 AM  Result Value Ref Range   Sodium 136 135 - 145 mmol/L   Potassium 3.9 3.5 - 5.1 mmol/L   Chloride 100 98 - 111 mmol/L   CO2 26 22 - 32 mmol/L   Glucose, Bld 132 (H) 70 - 99 mg/dL    Comment: Glucose reference range applies only to samples taken after fasting for at least 8 hours.   BUN 17 8 - 23 mg/dL   Creatinine, Ser 0.82 0.44 - 1.00 mg/dL   Calcium 8.8 (L) 8.9 - 10.3 mg/dL   Total Protein 6.5 6.5 - 8.1 g/dL   Albumin 3.8 3.5 - 5.0 g/dL   AST 19 15 - 41 U/L   ALT 16 0 - 44 U/L   Alkaline Phosphatase 49  38 -  126 U/L   Total Bilirubin 1.1 0.3 - 1.2 mg/dL   GFR, Estimated >60 >60 mL/min    Comment: (NOTE) Calculated using the CKD-EPI Creatinine Equation (2021)    Anion gap 10 5 - 15    Comment: Performed at [facility], [address], [place], [place] [address]  CBC     Status: None   Collection Time: 04/20/22  3:23 AM  Result Value Ref Range   WBC 5.5 4.0 - 10.5 K/uL   RBC 4.55 3.87 - 5.11 MIL/uL   Hemoglobin 13.1 12.0 - 15.0 g/dL   HCT 41.6 36.0 - 46.0 %   MCV 91.4 80.0 - 100.0 fL   MCH 28.8 26.0 - 34.0 pg   MCHC 31.5 30.0 - 36.0 g/dL   RDW 13.4 11.5 - 15.5 %   Platelets 175 150 - 400 K/uL   nRBC 0.0 0.0 - 0.2 %    Comment: Performed at [facility], [address], [place], [place] [address]  CBG monitoring, ED     Status: Abnormal   Collection Time: 04/20/22  8:50 AM  Result Value Ref Range   Glucose-Capillary 149 (H) 70 - 99 mg/dL    Comment: Glucose reference range applies only to samples taken after fasting for at least 8 hours.  CBG monitoring, ED     Status: Abnormal   Collection Time: 04/20/22 11:29 AM  Result Value Ref Range   Glucose-Capillary 154 (H) 70 - 99 mg/dL    Comment: Glucose reference range applies only to samples taken after fasting for at least 8 hours.  CBG monitoring, ED     Status: Abnormal   Collection Time: 04/20/22  4:49 PM  Result Value Ref Range   Glucose-Capillary 112 (H) 70 - 99 mg/dL    Comment: Glucose reference range applies only to samples taken after fasting for at least 8 hours.  CBG monitoring, ED     Status: Abnormal   Collection Time: 04/20/22  8:40 PM  Result Value Ref Range   Glucose-Capillary 107 (H) 70 - 99 mg/dL    Comment: Glucose reference range applies only to samples taken after fasting for at least 8 hours.  Glucose, capillary     Status: Abnormal   Collection Time: 04/20/22 10:28 PM  Result Value Ref Range   Glucose-Capillary 131 (H) 70 - 99 mg/dL    Comment: Glucose reference range applies only to samples taken after  fasting for at least 8 hours.  Glucose, capillary     Status: Abnormal   Collection Time: 04/21/22  7:33 AM  Result Value Ref Range   Glucose-Capillary 157 (H) 70 - 99 mg/dL    Comment: Glucose reference range applies only to samples taken after fasting for at least 8 hours.  Glucose, capillary     Status: Abnormal   Collection Time: 04/21/22 11:08 AM  Result Value Ref Range   Glucose-Capillary 154 (H) 70 - 99 mg/dL    Comment: Glucose reference range applies only to samples taken after fasting for at least 8 hours.    DG Knee Complete 4 Views Right  Result Date: 04/19/2022 CLINICAL DATA:  Knee pain after fall EXAM: RIGHT KNEE - COMPLETE 4+ VIEW COMPARISON:  None Available. FINDINGS: No fracture or malalignment. Mild tricompartment arthritis of the knee. No sizable effusion. There is mild lateral deviation of the patella. IMPRESSION: 1. No fracture is seen. 2. Mild lateral deviation of the patella 3. Mild tricompartment arthritis Electronically Signed   By:  Donavan Foil M.D.   On: 04/19/2022 16:49   DG Chest Port 1 View  Result Date: 04/19/2022 CLINICAL DATA:  Right hip fracture. EXAM: PORTABLE CHEST 1 VIEW COMPARISON:  December 30, 2021. FINDINGS: The heart size and mediastinal contours are within normal limits. Both lungs are clear. The visualized skeletal structures are unremarkable. IMPRESSION: No active disease. Electronically Signed   By: Marijo Conception M.D.   On: 04/19/2022 16:20   DG Hip Unilat W or Wo Pelvis 2-3 Views Right  Result Date: 04/19/2022 CLINICAL DATA:  Right hip pain after fall today. EXAM: DG HIP (WITH OR WITHOUT PELVIS) 2-3V RIGHT COMPARISON:  None Available. FINDINGS: Moderately displaced proximal right femoral neck fracture is noted. Also noted is moderately displaced fracture involving the left superior pubic ramus. IMPRESSION: Moderately displaced proximal right femoral neck fracture. Moderately displaced left superior pubis ramus fracture. Electronically Signed   By:  Marijo Conception M.D.   On: 04/19/2022 16:19    Intake/Output    None      ROS Blood pressure 118/64, pulse 98, temperature 98.4 F (36.9 C), temperature source Oral, resp. rate 16, height '5\' 5"'$  (1.651 m), weight 59 kg, SpO2 98 %. Physical Exam Vitals and nursing note reviewed.  Constitutional:      General: She is sleeping. She is not in acute distress.    Appearance: Normal appearance. She is not ill-appearing.     Comments: Hard of hearing but pleasant  Cardiovascular:     Heart sounds: S1 normal and S2 normal.  Pulmonary:     Effort: Pulmonary effort is normal.  Musculoskeletal:     Comments: Right lower extremity Right leg is shortened and externally rotated.  There is a TED hose in place Small abrasion noted to the anterior right hip.  This is superficial No significant swelling to the right leg DPN, SPN, TN sensory functions are grossly intact EHL, FHL, lesser toe motor functions intact Ankle flexion, extension, inversion eversion intact + DP pulse Extremity is warm No pitting edema No DCT Did not manipulate right hip due to known fracture Knee is nontender with firm palpation No tenderness to lower leg, ankle or foot  Left lower extremity              no open wounds or lesions, no swelling or ecchymosis   Nontender hip, knee, ankle and foot             No crepitus or gross motion noted with manipulation of the L leg  No knee or ankle effusion             No pain with axial loading or logrolling of the hip. Negative Stinchfield test   Knee stable to varus/ valgus and anterior/posterior stress             No pain with manipulation of the ankle or foot             No blocks to motion noted  Sens DPN, SPN, TN intact  Motor EHL, FHL, lesser toe motor, Ext, flex, evers 5/5  DP 2+, No significant edema             Compartments are soft and nontender, no pain with passive stretching   Bilateral upper extremities  shoulder, elbow, wrist, digits- no skin wounds,  nontender, no instability, no blocks to motion  Sens  Ax/R/M/U intact  Mot   Ax/ R/ PIN/ M/ AIN/ U intact  Rad 2+  Neurological:     Mental Status: She is easily aroused.       Assessment/Plan:  88 year old female with fragility fracture right femoral neck, displaced right femoral neck fracture  -Ground-level fall  -Fragility fracture, displaced right femoral neck fracture  Patient will need right hip hemiarthroplasty  Plan for OR tomorrow  Bedrest for now.  She appears comfortable.  No need for Buck's traction at this time  Therapies postoperatively  Will likely need SNF postop   Case discussed with son, consent to be obtained  - Pain management:  Multimodal, minimize narcotics - ABL anemia/Hemodynamics  Monitor - Medical issues   Per primary - DVT/PE prophylaxis:  On subcu heparin - ID:   Perioperative antibiotics - Metabolic Bone Disease:  Fragility fracture suggestive of osteoporosis  Check vitamin D levels  - Activity:  Bedrest for now, therapies postop - FEN/GI prophylaxis/Foley/Lines:  Carb modified diet  N.p.o. after midnight - Dispo:  OR tomorrow for right hip hemiarthroplasty   Jari Pigg, PA-C [phone] (C) 04/21/2022, 12:09 PM  [facility] [place] [address] [phone] Jenetta [phone] (F)    After 5pm and on the weekends please log on to Amion, go to orthopaedics and the look under the [facility] Call for the provider(s) on call. You can also call our office at [phone] and then follow the prompts to be connected to the call team.

## 2022-04-21 NOTE — Progress Notes (Signed)
Called ortho trauma to let them know the pt has arrived no new orders at this time

## 2022-04-22 ENCOUNTER — Encounter (HOSPITAL_COMMUNITY)
Admission: EM | Disposition: A | Discharge status: Skilled Nursing Facility | Payer: Self-pay | Source: Home / Self Care | Attending: Internal Medicine

## 2022-04-22 ENCOUNTER — Inpatient Hospital Stay (HOSPITAL_COMMUNITY): Payer: Medicare Other | Admitting: Certified Registered Nurse Anesthetist

## 2022-04-22 ENCOUNTER — Other Ambulatory Visit: Payer: Self-pay

## 2022-04-22 ENCOUNTER — Inpatient Hospital Stay (HOSPITAL_COMMUNITY): Payer: Medicare Other

## 2022-04-22 ENCOUNTER — Encounter (HOSPITAL_COMMUNITY): Payer: Self-pay | Admitting: Family Medicine

## 2022-04-22 DIAGNOSIS — S72001A Fracture of unspecified part of neck of right femur, initial encounter for closed fracture: Secondary | ICD-10-CM | POA: Diagnosis not present

## 2022-04-22 DIAGNOSIS — S72001D Fracture of unspecified part of neck of right femur, subsequent encounter for closed fracture with routine healing: Secondary | ICD-10-CM | POA: Diagnosis not present

## 2022-04-22 DIAGNOSIS — N289 Disorder of kidney and ureter, unspecified: Secondary | ICD-10-CM | POA: Diagnosis not present

## 2022-04-22 DIAGNOSIS — E119 Type 2 diabetes mellitus without complications: Secondary | ICD-10-CM

## 2022-04-22 HISTORY — PX: HIP ARTHROPLASTY: SHX981

## 2022-04-22 LAB — COMPREHENSIVE METABOLIC PANEL
ALT: 10 U/L (ref 0–44)
AST: 13 U/L — ABNORMAL LOW (ref 15–41)
Albumin: 3.4 g/dL — ABNORMAL LOW (ref 3.5–5.0)
Alkaline Phosphatase: 50 U/L (ref 38–126)
Anion gap: 15 (ref 5–15)
BUN: 28 mg/dL — ABNORMAL HIGH (ref 8–23)
CO2: 20 mmol/L — ABNORMAL LOW (ref 22–32)
Calcium: 9 mg/dL (ref 8.9–10.3)
Chloride: 98 mmol/L (ref 98–111)
Creatinine, Ser: 1.19 mg/dL — ABNORMAL HIGH (ref 0.44–1.00)
GFR, Estimated: 44 mL/min — ABNORMAL LOW (ref >60)
Glucose, Bld: 174 mg/dL — ABNORMAL HIGH (ref 70–99)
Potassium: 4.5 mmol/L (ref 3.5–5.1)
Sodium: 133 mmol/L — ABNORMAL LOW (ref 135–145)
Total Bilirubin: 1.3 mg/dL — ABNORMAL HIGH (ref 0.3–1.2)
Total Protein: 6.5 g/dL (ref 6.5–8.1)

## 2022-04-22 LAB — TYPE AND SCREEN
ABO/RH(D): A POS
Antibody Screen: NEGATIVE

## 2022-04-22 LAB — CBC
HCT: 41.2 % (ref 36.0–46.0)
Hemoglobin: 13.5 g/dL (ref 12.0–15.0)
MCH: 29.7 pg (ref 26.0–34.0)
MCHC: 32.8 g/dL (ref 30.0–36.0)
MCV: 90.5 fL (ref 80.0–100.0)
Platelets: 237 10*3/uL (ref 150–400)
RBC: 4.55 MIL/uL (ref 3.87–5.11)
RDW: 13.5 % (ref 11.5–15.5)
WBC: 6.7 10*3/uL (ref 4.0–10.5)
nRBC: 0 % (ref 0.0–0.2)

## 2022-04-22 LAB — GLUCOSE, CAPILLARY
Glucose-Capillary: 212 mg/dL — ABNORMAL HIGH (ref 70–99)
Glucose-Capillary: 190 mg/dL — ABNORMAL HIGH (ref 70–99)
Glucose-Capillary: 113 mg/dL — ABNORMAL HIGH (ref 70–99)
Glucose-Capillary: 157 mg/dL — ABNORMAL HIGH (ref 70–99)
Glucose-Capillary: 139 mg/dL — ABNORMAL HIGH (ref 70–99)
Glucose-Capillary: 323 mg/dL — ABNORMAL HIGH (ref 70–99)

## 2022-04-22 LAB — VITAMIN D 25 HYDROXY (VIT D DEFICIENCY, FRACTURES): Vit D, 25-Hydroxy: 42.05 ng/mL (ref 30–100)

## 2022-04-22 LAB — ABO/RH: ABO/RH(D): A POS

## 2022-04-22 SURGERY — HEMIARTHROPLASTY, HIP, DIRECT ANTERIOR APPROACH, FOR FRACTURE
Anesthesia: Monitor Anesthesia Care | Site: Hip | Laterality: Right

## 2022-04-22 MED ORDER — PROPOFOL 500 MG/50ML IV EMUL
INTRAVENOUS | Status: DC | PRN
  Administered 2022-04-22: 25 ug/kg/min via INTRAVENOUS

## 2022-04-22 MED ORDER — ONDANSETRON HCL 4 MG PO TABS: 4.0000 mg | ORAL_TABLET | Freq: Four times a day (QID) | ORAL | Status: DC | PRN

## 2022-04-22 MED ORDER — PHENYLEPHRINE 80 MCG/ML (10ML) SYRINGE FOR IV PUSH (FOR BLOOD PRESSURE SUPPORT)
PREFILLED_SYRINGE | INTRAVENOUS | Status: DC | PRN
  Administered 2022-04-22: 160 ug via INTRAVENOUS

## 2022-04-22 MED ORDER — PHENYLEPHRINE HCL-NACL 20-0.9 MG/250ML-% IV SOLN
INTRAVENOUS | Status: DC | PRN
  Administered 2022-04-22: 25 ug/min via INTRAVENOUS

## 2022-04-22 MED ORDER — INSULIN ASPART 100 UNIT/ML IJ SOLN
0.0000 [IU] | INTRAMUSCULAR | Status: DC | PRN
  Administered 2022-04-22: 2 [IU] via SUBCUTANEOUS
  Filled 2022-04-22: qty 1

## 2022-04-22 MED ORDER — PROPOFOL 10 MG/ML IV BOLUS
INTRAVENOUS | Status: DC | PRN
  Administered 2022-04-22: 30 mg via INTRAVENOUS
  Administered 2022-04-22: 10 mg via INTRAVENOUS

## 2022-04-22 MED ORDER — MENTHOL 3 MG MT LOZG: 1.0000 | LOZENGE | OROMUCOSAL | Status: DC | PRN

## 2022-04-22 MED ORDER — TRANEXAMIC ACID-NACL 1000-0.7 MG/100ML-% IV SOLN
1000.0000 mg | Freq: Once | INTRAVENOUS | Status: AC
  Administered 2022-04-22: 1000 mg via INTRAVENOUS
  Filled 2022-04-22: qty 100

## 2022-04-22 MED ORDER — ONDANSETRON HCL 4 MG/2ML IJ SOLN: 4.0000 mg | Freq: Four times a day (QID) | INTRAMUSCULAR | Status: DC | PRN

## 2022-04-22 MED ORDER — CEFAZOLIN SODIUM-DEXTROSE 2-4 GM/100ML-% IV SOLN
2.0000 g | Freq: Four times a day (QID) | INTRAVENOUS | Status: AC
  Administered 2022-04-22 – 2022-04-23 (×2): 2 g via INTRAVENOUS
  Filled 2022-04-22 (×2): qty 100

## 2022-04-22 MED ORDER — LACTATED RINGERS IV SOLN: INTRAVENOUS | Status: DC

## 2022-04-22 MED ORDER — FENTANYL CITRATE (PF) 250 MCG/5ML IJ SOLN
INTRAMUSCULAR | Status: AC
  Filled 2022-04-22: qty 5

## 2022-04-22 MED ORDER — ORAL CARE MOUTH RINSE
15.0000 mL | Freq: Once | OROMUCOSAL | Status: AC
Start: 2022-04-22 — End: 2022-04-22

## 2022-04-22 MED ORDER — OXYCODONE HCL 5 MG PO TABS: 5.0000 mg | ORAL_TABLET | Freq: Once | ORAL | Status: DC | PRN

## 2022-04-22 MED ORDER — METOCLOPRAMIDE HCL 5 MG/ML IJ SOLN: 5.0000 mg | Freq: Three times a day (TID) | INTRAMUSCULAR | Status: DC | PRN

## 2022-04-22 MED ORDER — ACETAMINOPHEN 10 MG/ML IV SOLN
1000.0000 mg | Freq: Once | INTRAVENOUS | Status: DC | PRN
Start: 2022-04-22 — End: 2022-04-22

## 2022-04-22 MED ORDER — FENTANYL CITRATE (PF) 100 MCG/2ML IJ SOLN: 25.0000 ug | INTRAMUSCULAR | Status: DC | PRN

## 2022-04-22 MED ORDER — ACETAMINOPHEN 500 MG PO TABS: 1000.0000 mg | ORAL_TABLET | Freq: Once | ORAL | Status: DC | PRN

## 2022-04-22 MED ORDER — OXYCODONE HCL 5 MG/5ML PO SOLN: 5.0000 mg | Freq: Once | ORAL | Status: DC | PRN

## 2022-04-22 MED ORDER — ACETAMINOPHEN 160 MG/5ML PO SOLN: 1000.0000 mg | Freq: Once | ORAL | Status: DC | PRN

## 2022-04-22 MED ORDER — CHLORHEXIDINE GLUCONATE 0.12 % MT SOLN
15.0000 mL | Freq: Once | OROMUCOSAL | Status: AC
  Administered 2022-04-22: 15 mL via OROMUCOSAL

## 2022-04-22 MED ORDER — ENOXAPARIN SODIUM 30 MG/0.3ML IJ SOSY
30.0000 mg | PREFILLED_SYRINGE | INTRAMUSCULAR | Status: DC
  Administered 2022-04-23: 30 mg via SUBCUTANEOUS
  Filled 2022-04-22: qty 0.3

## 2022-04-22 MED ORDER — MORPHINE SULFATE (PF) 2 MG/ML IV SOLN: 0.5000 mg | INTRAVENOUS | Status: DC | PRN

## 2022-04-22 MED ORDER — 0.9 % SODIUM CHLORIDE (POUR BTL) OPTIME
TOPICAL | Status: DC | PRN
  Administered 2022-04-22: 1000 mL

## 2022-04-22 MED ORDER — DOCUSATE SODIUM 100 MG PO CAPS
100.0000 mg | ORAL_CAPSULE | Freq: Two times a day (BID) | ORAL | Status: DC
  Administered 2022-04-22 – 2022-04-26 (×8): 100 mg via ORAL
  Filled 2022-04-22 (×8): qty 1

## 2022-04-22 MED ORDER — METOCLOPRAMIDE HCL 5 MG PO TABS: 5.0000 mg | ORAL_TABLET | Freq: Three times a day (TID) | ORAL | Status: DC | PRN

## 2022-04-22 MED ORDER — ACETAMINOPHEN 500 MG PO TABS
500.0000 mg | ORAL_TABLET | Freq: Four times a day (QID) | ORAL | Status: DC
  Administered 2022-04-22 – 2022-04-26 (×14): 500 mg via ORAL
  Filled 2022-04-22 (×14): qty 1

## 2022-04-22 MED ORDER — PHENOL 1.4 % MT LIQD: 1.0000 | OROMUCOSAL | Status: DC | PRN

## 2022-04-22 SURGICAL SUPPLY — 58 items
BAG COUNTER SPONGE SURGICOUNT (BAG) ×2 IMPLANT
BAG SPNG CNTER NS LX DISP (BAG) ×1
BLADE SAW SGTL 73X25 THK (BLADE) ×2 IMPLANT
BRUSH SCRUB EZ PLAIN DRY (MISCELLANEOUS) ×3 IMPLANT
COVER SURGICAL LIGHT HANDLE (MISCELLANEOUS) ×2 IMPLANT
DRAPE INCISE IOBAN 66X45 STRL (DRAPES) ×1 IMPLANT
DRAPE INCISE IOBAN 85X60 (DRAPES) ×1 IMPLANT
DRAPE ORTHO SPLIT 77X108 STRL (DRAPES) ×4
DRAPE SURG ORHT 6 SPLT 77X108 (DRAPES) ×2 IMPLANT
DRAPE U-SHAPE 47X51 STRL (DRAPES) ×2 IMPLANT
DRSG MEPILEX BORDER 4X8 (GAUZE/BANDAGES/DRESSINGS) ×2 IMPLANT
DRSG MEPILEX SACRM 8.7X9.8 (GAUZE/BANDAGES/DRESSINGS) ×1 IMPLANT
ELECT BLADE 6.5 EXT (BLADE) IMPLANT
ELECT CAUTERY BLADE 6.4 (BLADE) IMPLANT
ELECT REM PT RETURN 9FT ADLT (ELECTROSURGICAL) ×2
ELECTRODE REM PT RTRN 9FT ADLT (ELECTROSURGICAL) ×1 IMPLANT
GLOVE BIO SURGEON STRL SZ7.5 (GLOVE) ×2 IMPLANT
GLOVE BIO SURGEON STRL SZ8 (GLOVE) ×2 IMPLANT
GLOVE BIOGEL PI IND STRL 8 (GLOVE) ×2 IMPLANT
GLOVE BIOGEL PI INDICATOR 8 (GLOVE) ×2
GLOVE SURG ORTHO LTX SZ7.5 (GLOVE) ×4 IMPLANT
GOWN STRL REUS W/ TWL LRG LVL3 (GOWN DISPOSABLE) ×1 IMPLANT
GOWN STRL REUS W/ TWL XL LVL3 (GOWN DISPOSABLE) ×1 IMPLANT
GOWN STRL REUS W/TWL LRG LVL3 (GOWN DISPOSABLE) ×2
GOWN STRL REUS W/TWL XL LVL3 (GOWN DISPOSABLE) ×2
HANDPIECE INTERPULSE COAX TIP (DISPOSABLE)
HEAD FEM UNIPOLAR 45 OD STRL (Hips) ×1 IMPLANT
IMMOBILIZER KNEE 20 (SOFTGOODS) ×2 IMPLANT
IMMOBILIZER KNEE 20 THIGH 36 (SOFTGOODS) IMPLANT
KIT BASIN OR (CUSTOM PROCEDURE TRAY) ×2 IMPLANT
KIT TURNOVER KIT B (KITS) ×2 IMPLANT
NDL SUT .5 MAYO 1.404X.05X (NEEDLE) IMPLANT
NEEDLE MAYO TAPER (NEEDLE) ×2
NS IRRIG 1000ML POUR BTL (IV SOLUTION) ×2 IMPLANT
PACK TOTAL JOINT (CUSTOM PROCEDURE TRAY) ×2 IMPLANT
PAD ARMBOARD 7.5X6 YLW CONV (MISCELLANEOUS) ×4 IMPLANT
RETRIEVER SUT HEWSON (MISCELLANEOUS) ×2 IMPLANT
SET HNDPC FAN SPRY TIP SCT (DISPOSABLE) IMPLANT
SPACER FEM TAPERED +5 12/14 (Hips) ×1 IMPLANT
STAPLER VISISTAT 35W (STAPLE) ×2 IMPLANT
STEM SUMMIT BASIC PRESSFIT SZ4 (Hips) ×1 IMPLANT
SUT ETHILON 2 0 PSLX (SUTURE) ×3 IMPLANT
SUT FIBERWIRE #2 38 T-5 BLUE (SUTURE) ×4
SUT VIC AB 0 CT1 27 (SUTURE) ×2
SUT VIC AB 0 CT1 27XBRD ANBCTR (SUTURE) IMPLANT
SUT VIC AB 1 CT1 18XCR BRD 8 (SUTURE) ×1 IMPLANT
SUT VIC AB 1 CT1 27 (SUTURE)
SUT VIC AB 1 CT1 27XBRD ANBCTR (SUTURE) ×2 IMPLANT
SUT VIC AB 1 CT1 8-18 (SUTURE) ×2
SUT VIC AB 2-0 CT1 27 (SUTURE) ×2
SUT VIC AB 2-0 CT1 TAPERPNT 27 (SUTURE) ×2 IMPLANT
SUTURE FIBERWR #2 38 T-5 BLUE (SUTURE) ×2 IMPLANT
TOWEL GREEN STERILE (TOWEL DISPOSABLE) ×2 IMPLANT
TOWEL GREEN STERILE FF (TOWEL DISPOSABLE) ×2 IMPLANT
TRAY CATH INTERMITTENT SS 16FR (CATHETERS) ×2 IMPLANT
TRAY FOLEY MTR SLVR 14FR STAT (SET/KITS/TRAYS/PACK) ×1 IMPLANT
TUBE SUCT ARGYLE STRL (TUBING) ×1 IMPLANT
WATER STERILE IRR 1000ML POUR (IV SOLUTION) ×2 IMPLANT

## 2022-04-22 NOTE — Op Note (Signed)
04/19/2022 - 04/22/2022  12:20 PM  PATIENT:  Candice Ray  05-01-1932 female   MEDICAL RECORD NUMBER: [id number]  PRE-OPERATIVE DIAGNOSIS:  DISPLACED RIGHT FEMORAL NECK FRACTURE  POST-OPERATIVE DIAGNOSIS:  DISPLACED RIGHT FEMORAL NECK FRACTURE  PROCEDURE:  Procedure(s): UNIPOLAR HEMIARTHROPLASTY OF THE RIGHT HIP with [place]uy [place] Basic #4 femoral stem, +5 neck, 45 mm head  SURGEON:  Surgeon(s) and Role:    Myrene Galas, MD - Primary  PHYSICIAN ASSISTANT: Montez Morita, PA-C  ANESTHESIA:   general  EBL:  200 mL   BLOOD ADMINISTERED:none  DRAINS: none   LOCAL MEDICATIONS USED:  NONE  SPECIMEN:  No Specimen  DISPOSITION OF SPECIMEN:  N/A  COUNTS:  YES  TOURNIQUET:  * No tourniquets in log *  DICTATION: Note written in EPIC  PLAN OF CARE: Admit to inpatient   PATIENT DISPOSITION:  PACU - hemodynamically stable.   Delay start of Pharmacological VTE agent (>24hrs) due to surgical blood loss or risk of bleeding: no  BRIEF SUMMARY OF INDICATION FOR PROCEDURE:  Candice Ray is a very pleasant 88 y.o. with mild dementia, who sustained an witnessed fall producing inability to bear weight, shortening,and external rotation of the extremity.  She was seen and evaluated with the recommendation for hemiarthroplasty. The risks and benefits of surgery were discussed with the family, including the potential for leg length inequality, dislocation or instability, arthritis, loss of motion, DVT, PE, heart attack, stroke, and death.  Consent was given to proceed.  BRIEF SUMMARY OF PROCEDURE:  The patient was taken to the operating room where general anesthesia was induced and after administration of preoperative antibiotics consisting of 2 g of Ancef.  She was positioned with the right side up and all prominences were padded appropriately.  We made a 10 cm incision after the time-out, carrying dissection down to the IT band, was split in line with the skin.  Cerebellar retractor  was placed and we were able to then flex and internally rotate the hip releasing the piriformis and short rotators at their insertions.  The capsule was then T'd, tagging the corners with #1 Vicryl.  The neck cut was refined using a cutting guide and then this was followed by removal of the head, which sized perfectly to 45 mm. Acetabular trials were placed, confirming this size as the best fit. Mueller and Cobra retractors were placed along the proximal femur, which was then prepared with the canal finder,  then lateralizer, followed by reamers up to #4/5, and the broaches, achieving  outstanding fit and fill with the #4 broach.  The calcar reamer was used to refine the cut as we were using a low demand stem.  The canal was irrigated thoroughly and the acetabulum once again searched multiple times for fragments and irrigated thoroughly.  Trial components were placed and the patient had outstanding stability in combine 90 degrees of flexion, adduction, and internal rotation as well as in external rotation and extension.  Consequently, actual components were placed.  My assistant Montez Morita, was necessary for delivery and control of the proximal femur during preparation, also during relocation and dislocation of the trial components as well as relocation of the actual components.  He assisted me with wound closure as well.  I did repair the capsule with #1 Vicryl and then used #2 FiberWire through bone tunnels to repair the short rotators and piriformis.  This was followed by a #1 Vicryl for the IT band and lastly 2-0 Vicryl and nylon for  the subcutaneous and skin.  Sterile gently compressive dressing was applied.  The patient was awakened from anesthesia and transported to the PACU in stable condition.  PROGNOSIS:  The patient will be weightbearing as tolerated with posterior hip precautions.  Patient has an elevated risk of complications related to declining overall health and mobility.   Candice Ray remains on the Medical Service and will be on DVT prophylaxis mechanically and with Lovenox while in the hospital, but will likely not be a candidate for long-term prophylaxis given the dementia.     Doralee Albino. Carola Frost, M.D.

## 2022-04-23 DIAGNOSIS — S72001A Fracture of unspecified part of neck of right femur, initial encounter for closed fracture: Secondary | ICD-10-CM | POA: Diagnosis not present

## 2022-04-23 DIAGNOSIS — F0390 Unspecified dementia without behavioral disturbance: Secondary | ICD-10-CM | POA: Diagnosis not present

## 2022-04-23 DIAGNOSIS — S72001D Fracture of unspecified part of neck of right femur, subsequent encounter for closed fracture with routine healing: Secondary | ICD-10-CM | POA: Diagnosis not present

## 2022-04-23 DIAGNOSIS — L89301 Pressure ulcer of unspecified buttock, stage 1: Secondary | ICD-10-CM | POA: Diagnosis present

## 2022-04-23 DIAGNOSIS — E1149 Type 2 diabetes mellitus with other diabetic neurological complication: Secondary | ICD-10-CM | POA: Diagnosis not present

## 2022-04-23 LAB — GLUCOSE, CAPILLARY
Glucose-Capillary: 201 mg/dL — ABNORMAL HIGH (ref 70–99)
Glucose-Capillary: 300 mg/dL — ABNORMAL HIGH (ref 70–99)
Glucose-Capillary: 240 mg/dL — ABNORMAL HIGH (ref 70–99)
Glucose-Capillary: 223 mg/dL — ABNORMAL HIGH (ref 70–99)

## 2022-04-23 LAB — CBC
HCT: 36.4 % (ref 36.0–46.0)
Hemoglobin: 12.2 g/dL (ref 12.0–15.0)
MCH: 29.8 pg (ref 26.0–34.0)
MCHC: 33.5 g/dL (ref 30.0–36.0)
MCV: 89 fL (ref 80.0–100.0)
Platelets: 208 10*3/uL (ref 150–400)
RBC: 4.09 MIL/uL (ref 3.87–5.11)
RDW: 13.2 % (ref 11.5–15.5)
WBC: 6.2 10*3/uL (ref 4.0–10.5)
nRBC: 0 % (ref 0.0–0.2)

## 2022-04-23 LAB — BASIC METABOLIC PANEL
Anion gap: 12 (ref 5–15)
BUN: 25 mg/dL — ABNORMAL HIGH (ref 8–23)
CO2: 21 mmol/L — ABNORMAL LOW (ref 22–32)
Calcium: 8.5 mg/dL — ABNORMAL LOW (ref 8.9–10.3)
Chloride: 99 mmol/L (ref 98–111)
Creatinine, Ser: 0.93 mg/dL (ref 0.44–1.00)
GFR, Estimated: 59 mL/min — ABNORMAL LOW (ref >60)
Glucose, Bld: 176 mg/dL — ABNORMAL HIGH (ref 70–99)
Potassium: 4.2 mmol/L (ref 3.5–5.1)
Sodium: 132 mmol/L — ABNORMAL LOW (ref 135–145)

## 2022-04-23 MED ORDER — INSULIN ASPART 100 UNIT/ML IJ SOLN
3.0000 [IU] | Freq: Three times a day (TID) | INTRAMUSCULAR | Status: DC
  Administered 2022-04-23 – 2022-04-26 (×9): 3 [IU] via SUBCUTANEOUS

## 2022-04-23 MED ORDER — ENOXAPARIN SODIUM 40 MG/0.4ML IJ SOSY
40.0000 mg | PREFILLED_SYRINGE | INTRAMUSCULAR | Status: DC
  Administered 2022-04-24 – 2022-04-26 (×3): 40 mg via SUBCUTANEOUS
  Filled 2022-04-23 (×3): qty 0.4

## 2022-04-23 NOTE — Evaluation (Signed)
Clinical/Bedside Swallow Evaluation Patient Details  Name: Candice Ray MRN: [id number] Date of Birth: [birth year]  Today's Date: 04/23/2022 Time: SLP Start Time (ACUTE ONLY): 1025 SLP Stop Time (ACUTE ONLY): 1054 SLP Time Calculation (min) (ACUTE ONLY): 29 min  Past Medical History:  Past Medical History:  Diagnosis Date   Diabetes mellitus    HOH (hard of hearing)    Poor historian    Sinus trouble    Past Surgical History:  Past Surgical History:  Procedure Laterality Date   COLONOSCOPY  2007   Dr. Jena Gauss: tubular adenomas/rectal   COLONOSCOPY N/A 01/06/2014   WUJ:WJXBJYN diverticulosis. Colonic polyp-removed as described above. Status post segmental biopsy and stool collection for culture   HERNIA REPAIR     umbilical, [facility]   HYSTEROSCOPY WITH D & C  05/23/2012   Procedure: DILATATION AND CURETTAGE /HYSTEROSCOPY;  Surgeon: Lazaro Arms, MD;  Location: [facility];  Service: Gynecology;  Laterality: N/A;   POLYPECTOMY  05/23/2012   Procedure: POLYPECTOMY;  Surgeon: Lazaro Arms, MD;  Location: [facility];  Service: Gynecology;  Laterality: N/A;  endometrial polypectomy   HPI:  Per PT note Pt is a 88 yr old F who presented 04/19/22 as attempted to ambulate without a walker and fell and found to have a R proximal femoral fx and displaced L superior pubis fx. Pt s/p 04/22/22 unipolar hemiarthoplasty of R hip. PMH: HOH,dementia/schizophrenia, DM.  Swallow eval ordered.  Pt container found in room with masticated meat that she spat out.    Assessment / Plan / Recommendation  Clinical Impression  Patient alert in bed upon entrance to room.  Dentures provided and pt brushed her intact dentition per directions. She did not follow directions for oral motor exam but was able to seal her lips on a straw and no focal deficits apparent.  Pt consumed 4 ounces water, 4 ounces orange juice, entire container of yogurt and entire packet of crackers with peanut butter on them.  Her swallow is functional -  without oral retention when meal completed. She does tend to forgo swallowing prior to placing more food in her mouth, despite cues to masticate and clear first.  However, no indication of airway compromise during entire large snack.  Pt's son, Nadine Counts, arrived during the session and advised that pt has been chewing and spitting out some foods for several years - thus meat found on tray table in basin.  At this time, due to pt's behaviors and decreased mobilization, recommend dys3/thin diet.  Son agreeable to plan.  Advise liberalize diet when dc to SNF but during acute phase dys3/thin appropriate. Thanks. SLP Visit Diagnosis: Dysphagia, unspecified (R13.10)    Aspiration Risk  Mild aspiration risk    Diet Recommendation Dysphagia 3 (Mech soft);Thin liquid   Liquid Administration via: Cup;Straw Medication Administration: Whole meds with liquid Supervision: Patient able to self feed Compensations: Slow rate;Small sips/bites Postural Changes: Seated upright at 90 degrees;Remain upright for at least 30 minutes after po intake    Other  Recommendations Oral Care Recommendations: Oral care BID    Recommendations for follow up therapy are one component of a multi-disciplinary discharge planning process, led by the attending physician.  Recommendations may be updated based on patient status, additional functional criteria and insurance authorization.  Follow up Recommendations No SLP follow up      Assistance Recommended at Discharge Frequent or constant Supervision/Assistance  Functional Status Assessment Patient has not had a recent decline in their functional status  Frequency  and Duration         N/a   Prognosis        Swallow Study   General Date of Onset: 04/23/22 HPI: Per PT note Pt is a 88 yr old F who presented 04/19/22 as attempted to ambulate without a walker and fell and found to have a R proximal femoral fx and displaced L superior pubis fx. Pt s/p 04/22/22 unipolar hemiarthoplasty  of R hip. PMH: HOH,dementia/schizophrenia, DM.  Swallow eval ordered.  Pt container found in room with masticated meat that she spat out. Diet Prior to this Study: Regular;Thin liquids Temperature Spikes Noted: No Respiratory Status: Room air History of Recent Intubation: Yes (for surgery) Behavior/Cognition: Alert;Distractible;Doesn't follow directions;Requires cueing Oral Cavity Assessment: Dry Oral Care Completed by SLP: Yes Oral Cavity - Dentition: Other (Comment) (upper denture cleaned and given to pt; lower anterior teeth present - pt brushed on command) Vision: Functional for self-feeding Self-Feeding Abilities: Able to feed self Patient Positioning: Upright in bed Baseline Vocal Quality: Other (comment) (strained voice) Volitional Cough: Cognitively unable to elicit Volitional Swallow: Unable to elicit    Oral/Motor/Sensory Function Overall Oral Motor/Sensory Function: Other (comment) (no focal CN deficits apparent)   Ice Chips Ice chips: Not tested   Thin Liquid Thin Liquid: Within functional limits Presentation: Self Fed;Spoon    Nectar Thick Nectar Thick Liquid: Not tested   Honey Thick Honey Thick Liquid: Not tested   Puree Puree: Within functional limits Presentation: Self Fed;Spoon   Solid     Solid: Within functional limits      Chales Abrahams 04/23/2022,11:14 AM  Rolena Infante, MS [facility] SLP Acute Rehab Services Office [phone] Pager [phone]

## 2022-04-23 NOTE — Evaluation (Signed)
Occupational Therapy Evaluation Patient Details Name: Candice Ray MRN: [id number] DOB: 1932/01/17 Today's Date: 04/23/2022   History of Present Illness Pt is a 88 yr old F who presented 04/19/22 as attempted to ambulate without a walker and fell and found to have a R proximal femoral fx and displaced L superior pubis fx. Pt s/p 04/22/22 unipolar hemiarthoplasty of R hip. PMH: HOH,dementia/schizophrenia, DM   Clinical Impression   Pt at [facility] lives alone but has family in home from when they get up until they are going to bed. Per family they were assisting with all ADLS/IADLS, ambulating with RW household distances and has had decline in memory. Pt at this time required max cues/encouragement to complete all tasks with family present. Pt completed supine to sitting and sitting to supine with max assistance.  Pt currently with functional limitations due to the deficits listed below (see OT Problem List).  Pt will benefit from skilled OT to increase their safety and independence with ADL and functional mobility for ADL to facilitate discharge to venue listed below.        Recommendations for follow up therapy are one component of a multi-disciplinary discharge planning process, led by the attending physician.  Recommendations may be updated based on patient status, additional functional criteria and insurance authorization.   Follow Up Recommendations  Skilled nursing-short term rehab (<3 hours/day)    Assistance Recommended at Discharge Frequent or constant Supervision/Assistance  Patient can return home with the following Two people to help with walking and/or transfers;Two people to help with bathing/dressing/bathroom;Assistance with cooking/housework;Direct supervision/assist for medications management;Direct supervision/assist for financial management;Assist for transportation    Functional Status Assessment  Patient has had a recent decline in their functional status and demonstrates the  ability to make significant improvements in function in a reasonable and predictable amount of time.  Equipment Recommendations   (TBD)    Recommendations for Other Services       Precautions / Restrictions Precautions Precautions: Posterior Hip Precaution Comments: Educated but unable to comprehend precautions secondary to baseline dementia/HOH.  Family will need education; no family present at this time. Restrictions Weight Bearing Restrictions: Yes RLE Weight Bearing: Weight bearing as tolerated Other Position/Activity Restrictions: No orders for KI in chart but pt with KI donned when arrived and when ortho presented prior in day      Mobility Bed Mobility Overal bed mobility: Needs Assistance Bed Mobility: Supine to Sit, Sit to Supine Rolling: Max assist   Supine to sit: Max assist Sit to supine: Max assist        Transfers                   General transfer comment: deffered to further treatment due to agitation      Balance Overall balance assessment: Needs assistance Sitting-balance support: Bilateral upper extremity supported, Feet supported Sitting balance-Leahy Scale: Poor Sitting balance - Comments: unable to sit unsupported Postural control: Left lateral lean                                 ADL either performed or assessed with clinical judgement   ADL Overall ADL's : Needs assistance/impaired Eating/Feeding: Minimal assistance;Bed level   Grooming: Wash/dry hands;Wash/dry face;Moderate assistance;Bed level   Upper Body Bathing: Maximal assistance;Sitting;Bed level   Lower Body Bathing: Total assistance;Cueing for safety;Cueing for sequencing;Bed level   Upper Body Dressing : Maximal assistance;Bed level   Lower Body  Dressing: Total assistance;Bed level                 General ADL Comments: unable to complete any transfers at this time as pt VERY fearful of falling and to just try to sit at EOB first     Vision          Perception     Praxis      Pertinent Vitals/Pain Pain Assessment Pain Assessment: Faces Faces Pain Scale: Hurts even more Breathing: normal Negative Vocalization: none Facial Expression: smiling or inexpressive Body Language: relaxed Consolability: no need to console PAINAD Score: 0 Pain Location: Pt rubbed R leg at rest but then with any movements started to scream out lound to go to EOB but from sitting to supine no yealling present Pain Descriptors / Indicators: Sore, Restless, Aching, Discomfort, Grimacing, Guarding Pain Intervention(s): Limited activity within patient's tolerance, Monitored during session, Repositioned     Hand Dominance Right   Extremity/Trunk Assessment Upper Extremity Assessment Upper Extremity Assessment: Overall WFL for tasks assessed   Lower Extremity Assessment Lower Extremity Assessment: Defer to PT evaluation RLE Deficits / Details: Grossly 2-/5 RLE: Unable to fully assess due to pain       Communication Communication Communication: HOH (dementia, family reports no aides and does not matter what side you are on)   Cognition Arousal/Alertness: Awake/alert Behavior During Therapy: Anxious, Agitated Overall Cognitive Status: History of cognitive impairments - at baseline                                 General Comments: Pt's family reported cognative status has been declining over time and depending on items to recall they can recall some things     General Comments       Exercises     Shoulder Instructions      Home Living Family/patient expects to be discharged to:: Private residence Living Arrangements: Alone (but daughter in law comes to assist in AM until in bed) Available Help at Discharge: Family Type of Home: House Home Access: Stairs to enter     Home Layout: One level     Bathroom Shower/Tub: Chief Strategy Officer: Standard Bathroom Accessibility: Yes   Home Equipment: Clinical biochemist (2 wheels)   Additional Comments: Pt has a walker but as noted in chart attempted to ambulate without      Prior Functioning/Environment Prior Level of Function : Independent/Modified Independent             Mobility Comments: Per son they were only completion of house hold distances ADLs Comments: assists with all ADLS        OT Problem List: Decreased strength;Decreased activity tolerance;Impaired balance (sitting and/or standing);Decreased safety awareness;Decreased knowledge of use of DME or AE;Cardiopulmonary status limiting activity;Pain      OT Treatment/Interventions: Self-care/ADL training;Therapeutic exercise;DME and/or AE instruction;Therapeutic activities;Cognitive remediation/compensation;Patient/family education;Balance training    OT Goals(Current goals can be found in the care plan section) Acute Rehab OT Goals Patient Stated Goal: unable to report OT Goal Formulation: With patient/family Time For Goal Achievement: 05/07/22 Potential to Achieve Goals: Good ADL Goals Pt Will Perform Upper Body Bathing: with min guard assist;sitting Pt Will Perform Lower Body Bathing: with max assist;sit to/from stand Pt Will Transfer to Toilet: with max assist;squat pivot transfer Additional ADL Goal #1: Pt will tolerate 5 mins while sitting at EOB  OT Frequency: Min 2X/week  Co-evaluation              AM-PAC OT "6 Clicks" Daily Activity     Outcome Measure Help from another person eating meals?: A Little Help from another person taking care of personal grooming?: A Lot Help from another person toileting, which includes using toliet, bedpan, or urinal?: Total Help from another person bathing (including washing, rinsing, drying)?: A Lot Help from another person to put on and taking off regular upper body clothing?: A Little Help from another person to put on and taking off regular lower body clothing?: A Lot 6 Click Score: 13   End of Session Equipment  Utilized During Treatment: Rolling walker (2 wheels)  Activity Tolerance: Patient limited by pain;Treatment limited secondary to agitation Patient left: in bed;with call bell/phone within reach;with bed alarm set;with family/visitor present  OT Visit Diagnosis: Unsteadiness on feet (R26.81);Other abnormalities of gait and mobility (R26.89);Repeated falls (R29.6);Muscle weakness (generalized) (M62.81);Pain Pain - Right/Left: Right Pain - part of body: Hip                Time: 7253-6644 OT Time Calculation (min): 36 min Charges:  OT General Charges $OT Visit: 1 Visit OT Evaluation $OT Eval Low Complexity: 1 Low OT Treatments $Self Care/Home Management : 8-22 mins  Alphia Moh OTR/L  Acute Rehab Services  [phone] office number [phone] pager number   Alphia Moh 04/23/2022, 12:14 PM

## 2022-04-24 DIAGNOSIS — F0390 Unspecified dementia without behavioral disturbance: Secondary | ICD-10-CM | POA: Diagnosis not present

## 2022-04-24 DIAGNOSIS — E1149 Type 2 diabetes mellitus with other diabetic neurological complication: Secondary | ICD-10-CM | POA: Diagnosis not present

## 2022-04-24 DIAGNOSIS — S72001D Fracture of unspecified part of neck of right femur, subsequent encounter for closed fracture with routine healing: Secondary | ICD-10-CM | POA: Diagnosis not present

## 2022-04-24 LAB — CBC
HCT: 36.2 % (ref 36.0–46.0)
Hemoglobin: 11.7 g/dL — ABNORMAL LOW (ref 12.0–15.0)
MCH: 28.9 pg (ref 26.0–34.0)
MCHC: 32.3 g/dL (ref 30.0–36.0)
MCV: 89.4 fL (ref 80.0–100.0)
Platelets: 189 10*3/uL (ref 150–400)
RBC: 4.05 MIL/uL (ref 3.87–5.11)
RDW: 13.2 % (ref 11.5–15.5)
WBC: 6.6 10*3/uL (ref 4.0–10.5)
nRBC: 0 % (ref 0.0–0.2)

## 2022-04-24 LAB — BASIC METABOLIC PANEL
Anion gap: 11 (ref 5–15)
BUN: 16 mg/dL (ref 8–23)
CO2: 24 mmol/L (ref 22–32)
Calcium: 8.6 mg/dL — ABNORMAL LOW (ref 8.9–10.3)
Chloride: 98 mmol/L (ref 98–111)
Creatinine, Ser: 0.76 mg/dL (ref 0.44–1.00)
GFR, Estimated: >60 mL/min (ref >60)
Glucose, Bld: 210 mg/dL — ABNORMAL HIGH (ref 70–99)
Potassium: 4.6 mmol/L (ref 3.5–5.1)
Sodium: 133 mmol/L — ABNORMAL LOW (ref 135–145)

## 2022-04-24 LAB — GLUCOSE, CAPILLARY
Glucose-Capillary: 230 mg/dL — ABNORMAL HIGH (ref 70–99)
Glucose-Capillary: 249 mg/dL — ABNORMAL HIGH (ref 70–99)
Glucose-Capillary: 266 mg/dL — ABNORMAL HIGH (ref 70–99)
Glucose-Capillary: 217 mg/dL — ABNORMAL HIGH (ref 70–99)

## 2022-04-24 MED ORDER — INSULIN ASPART 100 UNIT/ML IJ SOLN
0.0000 [IU] | Freq: Three times a day (TID) | INTRAMUSCULAR | Status: DC
  Administered 2022-04-24: 8 [IU] via SUBCUTANEOUS
  Administered 2022-04-25: 15 [IU] via SUBCUTANEOUS
  Administered 2022-04-25: 8 [IU] via SUBCUTANEOUS
  Administered 2022-04-25: 11 [IU] via SUBCUTANEOUS
  Administered 2022-04-26 (×2): 3 [IU] via SUBCUTANEOUS

## 2022-04-24 MED ORDER — HALOPERIDOL LACTATE 5 MG/ML IJ SOLN
1.0000 mg | Freq: Four times a day (QID) | INTRAMUSCULAR | Status: DC | PRN
Start: 2022-04-24 — End: 2022-04-26

## 2022-04-24 MED ORDER — TRAMADOL HCL 50 MG PO TABS
25.0000 mg | ORAL_TABLET | Freq: Two times a day (BID) | ORAL | Status: DC | PRN
  Administered 2022-04-25 – 2022-04-26 (×2): 25 mg via ORAL
  Filled 2022-04-24 (×2): qty 1

## 2022-04-25 ENCOUNTER — Encounter (HOSPITAL_COMMUNITY): Payer: Self-pay | Admitting: Orthopedic Surgery

## 2022-04-25 DIAGNOSIS — S72001D Fracture of unspecified part of neck of right femur, subsequent encounter for closed fracture with routine healing: Secondary | ICD-10-CM | POA: Diagnosis not present

## 2022-04-25 LAB — CBC
HCT: 36 % (ref 36.0–46.0)
Hemoglobin: 11.9 g/dL — ABNORMAL LOW (ref 12.0–15.0)
MCH: 29.4 pg (ref 26.0–34.0)
MCHC: 33.1 g/dL (ref 30.0–36.0)
MCV: 88.9 fL (ref 80.0–100.0)
Platelets: 226 10*3/uL (ref 150–400)
RBC: 4.05 MIL/uL (ref 3.87–5.11)
RDW: 13.2 % (ref 11.5–15.5)
WBC: 5.9 10*3/uL (ref 4.0–10.5)
nRBC: 0 % (ref 0.0–0.2)

## 2022-04-25 LAB — GLUCOSE, CAPILLARY
Glucose-Capillary: 268 mg/dL — ABNORMAL HIGH (ref 70–99)
Glucose-Capillary: 312 mg/dL — ABNORMAL HIGH (ref 70–99)
Glucose-Capillary: 367 mg/dL — ABNORMAL HIGH (ref 70–99)
Glucose-Capillary: 101 mg/dL — ABNORMAL HIGH (ref 70–99)

## 2022-04-25 MED ORDER — BUPIVACAINE IN DEXTROSE 0.75-8.25 % IT SOLN
INTRATHECAL | Status: DC | PRN
  Administered 2022-04-22: 1.4 mL via INTRATHECAL

## 2022-04-25 MED ORDER — INSULIN GLARGINE-YFGN 100 UNIT/ML ~~LOC~~ SOLN
5.0000 [IU] | Freq: Two times a day (BID) | SUBCUTANEOUS | Status: DC
  Administered 2022-04-25 – 2022-04-26 (×3): 5 [IU] via SUBCUTANEOUS
  Filled 2022-04-25 (×4): qty 0.05

## 2022-04-25 NOTE — Anesthesia Postprocedure Evaluation (Signed)
Anesthesia Post Note  Patient: Candice Ray  Procedure(s) Performed: ARTHROPLASTY BIPOLAR HIP (HEMIARTHROPLASTY) (Right: Hip)     Patient location during evaluation: PACU Anesthesia Type: MAC and Spinal Level of consciousness: patient cooperative and awake Pain management: pain level controlled Vital Signs Assessment: post-procedure vital signs reviewed and stable Respiratory status: spontaneous breathing, nonlabored ventilation and respiratory function stable Cardiovascular status: blood pressure returned to baseline and stable Postop Assessment: no apparent nausea or vomiting Anesthetic complications: no   No notable events documented.  Last Vitals:  Vitals:   04/25/22 0600 04/25/22 1947  BP: (!) 107/55 101/60  Pulse: (!) 109 (!) 108  Resp: 16 16  Temp: 36.9 C 36.7 C  SpO2: 100% 95%    Last Pain:  Vitals:   04/25/22 1947  TempSrc: Oral  PainSc:                  Blanche Gallien

## 2022-04-26 DIAGNOSIS — S72001D Fracture of unspecified part of neck of right femur, subsequent encounter for closed fracture with routine healing: Secondary | ICD-10-CM | POA: Diagnosis not present

## 2022-04-26 DIAGNOSIS — E1149 Type 2 diabetes mellitus with other diabetic neurological complication: Secondary | ICD-10-CM | POA: Diagnosis not present

## 2022-04-26 DIAGNOSIS — F0390 Unspecified dementia without behavioral disturbance: Secondary | ICD-10-CM | POA: Diagnosis not present

## 2022-04-26 DIAGNOSIS — S329XXD Fracture of unspecified parts of lumbosacral spine and pelvis, subsequent encounter for fracture with routine healing: Secondary | ICD-10-CM | POA: Diagnosis not present

## 2022-04-26 LAB — GLUCOSE, CAPILLARY
Glucose-Capillary: 192 mg/dL — ABNORMAL HIGH (ref 70–99)
Glucose-Capillary: 192 mg/dL — ABNORMAL HIGH (ref 70–99)

## 2022-04-26 MED ORDER — MUSCLE RUB 10-15 % EX CREA
TOPICAL_CREAM | CUTANEOUS | Status: DC | PRN
Start: 2022-04-26 — End: 2022-04-26
  Filled 2022-04-26: qty 85

## 2022-04-26 MED ORDER — ACETAMINOPHEN 500 MG PO TABS
500.0000 mg | ORAL_TABLET | Freq: Four times a day (QID) | ORAL | 0 refills | Status: AC | PRN
Start: 2022-04-26 — End: ?

## 2022-04-26 MED ORDER — MUSCLE RUB 10-15 % EX CREA: 1.0000 | TOPICAL_CREAM | CUTANEOUS | 0 refills | Status: DC | PRN

## 2022-04-26 MED ORDER — DOCUSATE SODIUM 100 MG PO CAPS: 100.0000 mg | ORAL_CAPSULE | Freq: Two times a day (BID) | ORAL | Status: DC | PRN

## 2022-04-26 MED ORDER — ONDANSETRON HCL 4 MG PO TABS: 4.0000 mg | ORAL_TABLET | Freq: Four times a day (QID) | ORAL | 0 refills | Status: DC | PRN

## 2022-04-26 MED ORDER — TRAMADOL HCL 50 MG PO TABS: 25.0000 mg | ORAL_TABLET | Freq: Two times a day (BID) | ORAL | 0 refills | Status: DC | PRN

## 2022-04-26 MED ORDER — ENOXAPARIN SODIUM 40 MG/0.4ML IJ SOSY: 40.0000 mg | PREFILLED_SYRINGE | INTRAMUSCULAR | Status: DC

## 2022-04-26 MED ORDER — TRAZODONE HCL 50 MG PO TABS: 50.0000 mg | ORAL_TABLET | Freq: Every evening | ORAL | Status: DC | PRN

## 2022-04-26 MED ORDER — POLYETHYLENE GLYCOL 3350 17 G PO PACK: 17.0000 g | PACK | Freq: Every day | ORAL | 0 refills | Status: DC | PRN

## 2022-04-26 NOTE — Plan of Care (Signed)
Problem: Education: Goal: Ability to describe self-care measures that may prevent or decrease complications (Diabetes Survival Skills Education) will improve 04/26/2022 1159 by Charma Igo, LPN Outcome: Completed/Met 04/26/2022 1159 by Charma Igo, LPN Outcome: Adequate for Discharge 04/26/2022 0908 by Charma Igo, LPN Outcome: Progressing Goal: Individualized Educational Video(s) 04/26/2022 1159 by Charma Igo, LPN Outcome: Completed/Met 04/26/2022 1159 by Charma Igo, LPN Outcome: Adequate for Discharge 04/26/2022 0908 by Charma Igo, LPN Outcome: Progressing   Problem: Coping: Goal: Ability to adjust to condition or change in health will improve 04/26/2022 1159 by Charma Igo, LPN Outcome: Completed/Met 04/26/2022 1159 by Charma Igo, LPN Outcome: Adequate for Discharge 04/26/2022 0908 by Charma Igo, LPN Outcome: Progressing   Problem: Fluid Volume: Goal: Ability to maintain a balanced intake and output will improve 04/26/2022 1159 by Charma Igo, LPN Outcome: Completed/Met 04/26/2022 1159 by Charma Igo, LPN Outcome: Adequate for Discharge 04/26/2022 0908 by Charma Igo, LPN Outcome: Progressing   Problem: Health Behavior/Discharge Planning: Goal: Ability to identify and utilize available resources and services will improve 04/26/2022 1159 by Charma Igo, LPN Outcome: Completed/Met 04/26/2022 1159 by Charma Igo, LPN Outcome: Adequate for Discharge 04/26/2022 0908 by Charma Igo, LPN Outcome: Progressing Goal: Ability to manage health-related needs will improve 04/26/2022 1159 by Charma Igo, LPN Outcome: Completed/Met 04/26/2022 1159 by Charma Igo, LPN Outcome: Adequate for Discharge 04/26/2022 0908 by Charma Igo, LPN Outcome: Progressing   Problem: Metabolic: Goal: Ability to maintain appropriate glucose levels will improve 04/26/2022 1159 by Charma Igo, LPN Outcome:  Completed/Met 04/26/2022 1159 by Charma Igo, LPN Outcome: Adequate for Discharge 04/26/2022 0908 by Charma Igo, LPN Outcome: Progressing   Problem: Nutritional: Goal: Maintenance of adequate nutrition will improve 04/26/2022 1159 by Charma Igo, LPN Outcome: Completed/Met 04/26/2022 1159 by Charma Igo, LPN Outcome: Adequate for Discharge 04/26/2022 0908 by Charma Igo, LPN Outcome: Progressing Goal: Progress toward achieving an optimal weight will improve 04/26/2022 1159 by Charma Igo, LPN Outcome: Completed/Met 04/26/2022 1159 by Charma Igo, LPN Outcome: Adequate for Discharge 04/26/2022 0908 by Charma Igo, LPN Outcome: Progressing   Problem: Skin Integrity: Goal: Risk for impaired skin integrity will decrease 04/26/2022 1159 by Charma Igo, LPN Outcome: Completed/Met 04/26/2022 1159 by Charma Igo, LPN Outcome: Adequate for Discharge 04/26/2022 0908 by Charma Igo, LPN Outcome: Progressing   Problem: Tissue Perfusion: Goal: Adequacy of tissue perfusion will improve 04/26/2022 1159 by Charma Igo, LPN Outcome: Completed/Met 04/26/2022 1159 by Charma Igo, LPN Outcome: Adequate for Discharge 04/26/2022 0908 by Charma Igo, LPN Outcome: Progressing   Problem: Education: Goal: Knowledge of General Education information will improve Description: Including pain rating scale, medication(s)/side effects and non-pharmacologic comfort measures 04/26/2022 1159 by Charma Igo, LPN Outcome: Completed/Met 04/26/2022 1159 by Charma Igo, LPN Outcome: Adequate for Discharge 04/26/2022 0908 by Charma Igo, LPN Outcome: Progressing   Problem: Health Behavior/Discharge Planning: Goal: Ability to manage health-related needs will improve 04/26/2022 1159 by Charma Igo, LPN Outcome: Completed/Met 04/26/2022 1159 by Charma Igo, LPN Outcome: Adequate for Discharge 04/26/2022 0908 by Charma Igo,  LPN Outcome: Progressing   Problem: Clinical Measurements: Goal: Ability to maintain clinical measurements within normal limits will improve 04/26/2022 1159 by Charma Igo, LPN Outcome: Completed/Met 04/26/2022 1159 by Charma Igo, LPN Outcome: Adequate for Discharge 04/26/2022 0908 by Charma Igo, LPN Outcome:  Progressing Goal: Will remain free from infection 04/26/2022 1159 by Charma Igo, LPN Outcome: Completed/Met 04/26/2022 1159 by Charma Igo, LPN Outcome: Adequate for Discharge 04/26/2022 0908 by Charma Igo, LPN Outcome: Progressing Goal: Diagnostic test results will improve 04/26/2022 1159 by Charma Igo, LPN Outcome: Completed/Met 04/26/2022 1159 by Charma Igo, LPN Outcome: Adequate for Discharge 04/26/2022 0908 by Charma Igo, LPN Outcome: Progressing Goal: Respiratory complications will improve 04/26/2022 1159 by Charma Igo, LPN Outcome: Completed/Met 04/26/2022 1159 by Charma Igo, LPN Outcome: Adequate for Discharge 04/26/2022 0908 by Charma Igo, LPN Outcome: Progressing Goal: Cardiovascular complication will be avoided 04/26/2022 1159 by Charma Igo, LPN Outcome: Completed/Met 04/26/2022 1159 by Charma Igo, LPN Outcome: Adequate for Discharge 04/26/2022 0908 by Charma Igo, LPN Outcome: Progressing   Problem: Activity: Goal: Risk for activity intolerance will decrease 04/26/2022 1159 by Charma Igo, LPN Outcome: Completed/Met 04/26/2022 1159 by Charma Igo, LPN Outcome: Adequate for Discharge 04/26/2022 0908 by Charma Igo, LPN Outcome: Progressing   Problem: Nutrition: Goal: Adequate nutrition will be maintained 04/26/2022 1159 by Charma Igo, LPN Outcome: Completed/Met 04/26/2022 1159 by Charma Igo, LPN Outcome: Adequate for Discharge 04/26/2022 0908 by Charma Igo, LPN Outcome: Progressing   Problem: Coping: Goal: Level of anxiety will  decrease 04/26/2022 1159 by Charma Igo, LPN Outcome: Completed/Met 04/26/2022 1159 by Charma Igo, LPN Outcome: Adequate for Discharge 04/26/2022 0908 by Charma Igo, LPN Outcome: Progressing   Problem: Elimination: Goal: Will not experience complications related to bowel motility 04/26/2022 1159 by Charma Igo, LPN Outcome: Completed/Met 04/26/2022 1159 by Charma Igo, LPN Outcome: Adequate for Discharge 04/26/2022 0908 by Charma Igo, LPN Outcome: Progressing Goal: Will not experience complications related to urinary retention 04/26/2022 1159 by Charma Igo, LPN Outcome: Completed/Met 04/26/2022 1159 by Charma Igo, LPN Outcome: Adequate for Discharge 04/26/2022 0908 by Charma Igo, LPN Outcome: Progressing   Problem: Pain Managment: Goal: General experience of comfort will improve 04/26/2022 1159 by Charma Igo, LPN Outcome: Completed/Met 04/26/2022 1159 by Charma Igo, LPN Outcome: Adequate for Discharge 04/26/2022 0908 by Charma Igo, LPN Outcome: Progressing   Problem: Safety: Goal: Ability to remain free from injury will improve 04/26/2022 1159 by Charma Igo, LPN Outcome: Completed/Met 04/26/2022 1159 by Charma Igo, LPN Outcome: Adequate for Discharge 04/26/2022 0908 by Charma Igo, LPN Outcome: Progressing   Problem: Skin Integrity: Goal: Risk for impaired skin integrity will decrease 04/26/2022 1159 by Charma Igo, LPN Outcome: Completed/Met 04/26/2022 1159 by Charma Igo, LPN Outcome: Adequate for Discharge 04/26/2022 0908 by Charma Igo, LPN Outcome: Progressing

## 2022-04-26 NOTE — Discharge Summary (Signed)
Physician Discharge Summary   Patient: Candice Ray MRN: [id number] DOB: [birth year]  Admit date:     04/19/2022  Discharge date: 04/26/22  Discharge Physician: Joycelyn Das   PCP: Pearson Grippe, MD   Recommendations at discharge:   Follow-up with your primary care provider at the skilled nursing facility in 3 to 5 days.  Check CBC BMP magnesium LFT in the next visit Follow-up with orthopedics in 10 to 14 days for wound checkup/ surgery follow-up. Orthopedics recommends Lovenox 21 days for DVT prophylaxis.  Would benefit from  Outpatient referral to osteoporosis clinic.   Discharge Diagnoses: Principal Problem:   Rt Hip fracture (HCC) Active Problems:   Pelvic fracture/Lt Superior Pubis Ramus Fracture   DM (diabetes mellitus), type 2 with neurological complications (HCC)   Dementia without behavioral disturbance (HCC)   Schizophrenia (HCC)   Pressure injury of skin   Pressure injury of buttock, stage 1  Resolved Problems:   * No resolved hospital problems. *  Hospital Course:  Candice Ray 88 years old female with past medical history of diabetes mellitus type 2, dementia, schizophrenia, hearing loss presented to hospital brought in by EMS for fall.  Patient decided to walk in the hallway without walker when she stumbled and fell.  This was a witnessed fall without a loss of consciousness or trauma to the head.  Patient has hard of hearing and is very forgetful with history of schizophrenia and psychosis as per the family.  In the ED patient was noted to have right femoral neck fracture and patient was considered for admission to the hospital.  Orthopedics was consulted.     Assessment and Plan: Proximal Right femoral neck fracture, Displaced left superior pubis ramus fracture:  Orthopedics was consulted and underwent right hip hemiarthroplasty.  Orthopedic recommends weightbearing as tolerated.  Physical therapy occupational therapy has seen the patient and need a skilled nursing  facility placement..  Continue pain management with Tylenol.  Limit narcotics.  Orthopedics recommends Lovenox 21 days for DVT prophylaxis.  Outpatient referral to osteoporosis clinic.  Plan for orthopedic follow-up in 10 to 14 days.   Dementia and Schizophrenia:   Currently appears calm.  Needed some Haldol during hospitalization.  Has been prescribed trazodone as needed at nighttime.  Focus on pain control.   Dysphagia:  Continue dysphagia 3 diet with thin liquids.  Continue on discharge.   Type 2 DM uncontrolled with hyperglycemia.  Moderate dose sliding scale insulin and NovoLog 3 times during hospitalization.  Latest hemoglobin A1c of 8.4.  Continue diabetic diet and Synjardy from home.   Mild AKI. Latest creatinine at 0.7.  Improved.   Pressure injury stage I in the mid buttocks.  Present on admission.  Continue with prevention protocol.  Consultants: Orthopedics Procedures performed: Right hip hemiarthroplasty on 04/22/2022  Disposition: Skilled nursing facility Diet recommendation:  Discharge Diet Orders (From admission, onward)     Start     Ordered   04/26/22 0000  Diet Carb Modified       Comments: Mechanical soft diet.   04/26/22 1102           Carb modified diet DISCHARGE MEDICATION: Allergies as of 04/26/2022       Reactions   Contrast Media [iodinated Contrast Media] Swelling   Metrizamide Swelling        Medication List     TAKE these medications    acetaminophen 500 MG tablet Commonly known as: TYLENOL Take 1 tablet (500 mg total) by mouth every  6 (six) hours as needed for mild pain or moderate pain.   aspirin EC 81 MG tablet Take 81 mg by mouth daily.   CALTRATE 600+D PO Take 1 tablet by mouth 2 (two) times daily.   docusate sodium 100 MG capsule Commonly known as: COLACE Take 1 capsule (100 mg total) by mouth 2 (two) times daily as needed for mild constipation or moderate constipation.   enoxaparin 40 MG/0.4ML injection Commonly known  as: LOVENOX Inject 0.4 mLs (40 mg total) into the skin daily for 21 days. Start taking on: April 27, 2022   ibuprofen 600 MG tablet Commonly known as: ADVIL Take 1 tablet (600 mg total) by mouth every 6 (six) hours as needed for mild pain.   Muscle Rub 10-15 % Crea Apply 1 Application topically as needed for muscle pain.   ondansetron 4 MG tablet Commonly known as: ZOFRAN Take 1 tablet (4 mg total) by mouth every 6 (six) hours as needed for nausea.   polyethylene glycol 17 g packet Commonly known as: MIRALAX / GLYCOLAX Take 17 g by mouth daily as needed for moderate constipation or severe constipation.   Synjardy 12.02-999 MG Tabs Generic drug: Empagliflozin-metFORMIN HCl Take 1 tablet by mouth 2 (two) times daily.   traMADol 50 MG tablet Commonly known as: ULTRAM Take 0.5 tablets (25 mg total) by mouth every 12 (twelve) hours as needed for moderate pain.   traZODone 50 MG tablet Commonly known as: DESYREL Take 1 tablet (50 mg total) by mouth at bedtime as needed for sleep.               Discharge Care Instructions  (From admission, onward)           Start     Ordered   04/26/22 0000  Discharge wound care:       Comments: Reinforce dressing at the surgical site.  Pressure ulcer prevention protocol.   04/26/22 1102            Contact information for follow-up providers     Myrene Galas, MD. Schedule an appointment as soon as possible for a visit in 2 week(s).   Specialty: Orthopedic Surgery Contact information: [address] [place] [address] [phone]              Contact information for after-discharge care     Destination     [facility] Preferred SNF .   Service: Skilled Nursing Contact information: [address] [place] [address] [phone]                    Subjective Today, patient was seen and examined at bedside.  Very hard of hearing.  Denies any chest pain, nausea,  vomiting, overt pain.  Discharge Exam: Filed Weights   04/19/22 1501  Weight: 59 kg      04/26/2022    7:31 AM 04/26/2022    4:54 AM 04/25/2022    7:47 PM  Vitals with BMI  Systolic 108 109 010  Diastolic 72 74 60  Pulse 100 103 108    General:  Average built, not in obvious distress, very hard of hearing HENT:   No scleral pallor or icterus noted. Oral mucosa is moist.  Chest:  Clear breath sounds.  Diminished breath sounds bilaterally. No crackles or wheezes.  CVS: S1 &S2 heard. No murmur.  Regular rate and rhythm. Abdomen: Soft, nontender, nondistended.  Bowel sounds are heard.   Extremities: No cyanosis, clubbing  or edema.  Peripheral pulses are palpable.  Right knee immobilizer in place. Psych: Alert, awake and oriented, normal mood CNS:  No cranial nerve deficits.  Moves all extremities. Skin: Warm and dry.  No rashes noted.  Condition at discharge: fair  The results of significant diagnostics from this hospitalization (including imaging, microbiology, ancillary and laboratory) are listed below for reference.   Imaging Studies: DG Hip Port Unilat With Pelvis 1V Right  Result Date: 04/22/2022 CLINICAL DATA:  [id number] status post right hip replacement EXAM: DG HIP (WITH OR WITHOUT PELVIS) 1V PORT RIGHT COMPARISON:  April 19, 2022 FINDINGS: There has been right hip prosthesis and prosthetic femoral component is in anatomical alignment. Mildly displaced fracture of the left pubic ramus. Soft tissue emphysema of recent surgery. IMPRESSION: Right hip prosthetic changes. Prosthetic femoral component is in anatomical alignment. Mildly displaced fracture of the left pubic ramus. Electronically Signed   By: Marjo Bicker M.D.   On: 04/22/2022 16:16   DG Knee Complete 4 Views Right  Result Date: 04/19/2022 CLINICAL DATA:  Knee pain after fall EXAM: RIGHT KNEE - COMPLETE 4+ VIEW COMPARISON:  None Available. FINDINGS: No fracture or malalignment. Mild tricompartment arthritis of the knee. No  sizable effusion. There is mild lateral deviation of the patella. IMPRESSION: 1. No fracture is seen. 2. Mild lateral deviation of the patella 3. Mild tricompartment arthritis Electronically Signed   By: Jasmine Pang M.D.   On: 04/19/2022 16:49   DG Chest Port 1 View  Result Date: 04/19/2022 CLINICAL DATA:  Right hip fracture. EXAM: PORTABLE CHEST 1 VIEW COMPARISON:  December 30, 2021. FINDINGS: The heart size and mediastinal contours are within normal limits. Both lungs are clear. The visualized skeletal structures are unremarkable. IMPRESSION: No active disease. Electronically Signed   By: Lupita Raider M.D.   On: 04/19/2022 16:20   DG Hip Unilat W or Wo Pelvis 2-3 Views Right  Result Date: 04/19/2022 CLINICAL DATA:  Right hip pain after fall today. EXAM: DG HIP (WITH OR WITHOUT PELVIS) 2-3V RIGHT COMPARISON:  None Available. FINDINGS: Moderately displaced proximal right femoral neck fracture is noted. Also noted is moderately displaced fracture involving the left superior pubic ramus. IMPRESSION: Moderately displaced proximal right femoral neck fracture. Moderately displaced left superior pubis ramus fracture. Electronically Signed   By: Lupita Raider M.D.   On: 04/19/2022 16:19    Microbiology: Results for orders placed or performed during the hospital encounter of 04/19/22  Surgical pcr screen     Status: None   Collection Time: 04/21/22  4:34 PM   Specimen: Nasal Mucosa; Nasal Swab  Result Value Ref Range Status   MRSA, PCR NEGATIVE NEGATIVE Final   Staphylococcus aureus NEGATIVE NEGATIVE Final    Comment: (NOTE) The Xpert SA Assay (FDA approved for NASAL specimens in patients 45 years of age and older), is one component of a comprehensive surveillance program. It is not intended to diagnose infection nor to guide or monitor treatment. Performed at [facility], [address], [place], [place] [address]     Labs: CBC: Recent Labs  Lab 04/19/22 1653 04/20/22 0323  04/22/22 0240 04/23/22 0211 04/24/22 0802 04/25/22 0731  WBC 10.8* 5.5 6.7 6.2 6.6 5.9  NEUTROABS 8.4*  --   --   --   --   --   HGB 13.1 13.1 13.5 12.2 11.7* 11.9*  HCT 39.7 41.6 41.2 36.4 36.2 36.0  MCV 89.2 91.4 90.5 89.0 89.4 88.9  PLT 204  175 237 208 189 226   Basic Metabolic Panel: Recent Labs  Lab 04/19/22 1653 04/20/22 0323 04/22/22 0240 04/23/22 0211 04/24/22 0802  NA 136 136 133* 132* 133*  K 4.1 3.9 4.5 4.2 4.6  CL 101 100 98 99 98  CO2 28 26 20* 21* 24  GLUCOSE 155* 132* 174* 176* 210*  BUN 19 17 28* 25* 16  CREATININE 0.82 0.82 1.19* 0.93 0.76  CALCIUM 9.3 8.8* 9.0 8.5* 8.6*   Liver Function Tests: Recent Labs  Lab 04/19/22 1653 04/20/22 0323 04/22/22 0240  AST 21 19 13*  ALT 14 16 10   ALKPHOS 50 49 50  BILITOT 0.8 1.1 1.3*  PROT 6.4* 6.5 6.5  ALBUMIN 3.8 3.8 3.4*   CBG: Recent Labs  Lab 04/25/22 1208 04/25/22 1536 04/25/22 1950 04/26/22 0736 04/26/22 1059  GLUCAP 312* 367* 101* 192* 192*    Discharge time spent: greater than 30 minutes.  Signed: Joycelyn Das, MD [facility] 04/26/2022

## 2022-04-26 NOTE — Progress Notes (Signed)
[facility] Nurse and gave report about the patient.

## 2022-04-28 ENCOUNTER — Encounter: Payer: Self-pay | Admitting: Adult Health

## 2022-04-28 ENCOUNTER — Non-Acute Institutional Stay (SKILLED_NURSING_FACILITY): Payer: Medicare Other | Admitting: Adult Health

## 2022-04-28 DIAGNOSIS — I700 Atherosclerosis of aorta: Secondary | ICD-10-CM

## 2022-04-28 DIAGNOSIS — F0390 Unspecified dementia without behavioral disturbance: Secondary | ICD-10-CM

## 2022-04-28 DIAGNOSIS — E1149 Type 2 diabetes mellitus with other diabetic neurological complication: Secondary | ICD-10-CM | POA: Diagnosis not present

## 2022-04-28 DIAGNOSIS — S329XXD Fracture of unspecified parts of lumbosacral spine and pelvis, subsequent encounter for fracture with routine healing: Secondary | ICD-10-CM

## 2022-04-28 DIAGNOSIS — S72001D Fracture of unspecified part of neck of right femur, subsequent encounter for closed fracture with routine healing: Secondary | ICD-10-CM

## 2022-04-28 DIAGNOSIS — E440 Moderate protein-calorie malnutrition: Secondary | ICD-10-CM

## 2022-04-28 DIAGNOSIS — F209 Schizophrenia, unspecified: Secondary | ICD-10-CM

## 2022-04-28 NOTE — Progress Notes (Signed)
This encounter was created in error - please disregard.

## 2022-04-28 NOTE — Progress Notes (Signed)
Location:  [facility] Room Number: 129/P Place of Service:  SNF (31)   CODE STATUS: DNR  Allergies  Allergen Reactions   Contrast Media [Iodinated Contrast Media] Swelling   Metrizamide Swelling    Chief Complaint  Patient presents with   Hospitalization Follow-up    Hospital follow up    HPI:  Candice Ray is a 88 year old woman who has been hospitalized from 04-19-22 through 04-26-22. Her medical history includes: dementia; schizophrenia; diabetes. Candice Ray presented to the ED after a mechanical fall; Candice Ray was walking in hallway without her walker. Candice Ray was found to have a right femoral neck fracture and Moderately displaced left superior pubis ramus fracture. Candice Ray had a right hip hemiarthroplasty on 04-22-22. Candice Ray is here for short term with her goal to return back home. Candice Ray denies any pain; there is no change in appetite; no anxiety. Candice Ray will continue to be followed for her chronic illnesses including:  Aortic atherosclerosis . DM (diabetes mellitus) type 2 with neurological complications:  Dementia without behavioral disturbance: Schizophrenia unspecified type     Past Medical History:  Diagnosis Date   Diabetes mellitus    HOH (hard of hearing)    Poor historian    Sinus trouble     Past Surgical History:  Procedure Laterality Date   COLONOSCOPY  2007   Dr. Gala Romney: tubular adenomas/rectal   COLONOSCOPY N/A 01/06/2014   QQP:[id number] diverticulosis. Colonic polyp-removed as described above. Status post segmental biopsy and stool collection for culture   HERNIA REPAIR     umbilical, [facility]   HIP ARTHROPLASTY Right 04/22/2022   Procedure: ARTHROPLASTY BIPOLAR HIP (HEMIARTHROPLASTY);  Surgeon: Altamese Klagetoh, MD;  Location: [facility];  Service: Orthopedics;  Laterality: Right;   HYSTEROSCOPY WITH D & C  05/23/2012   Procedure: DILATATION AND CURETTAGE /HYSTEROSCOPY;  Surgeon: Florian Buff, MD;  Location: [facility];  Service: Gynecology;  Laterality: N/A;   POLYPECTOMY  05/23/2012    Procedure: POLYPECTOMY;  Surgeon: Florian Buff, MD;  Location: [facility];  Service: Gynecology;  Laterality: N/A;  endometrial polypectomy    Social History   Socioeconomic History   Marital status: Married    Spouse name: Not on file   Number of children: Not on file   Years of education: Not on file   Highest education level: Not on file  Occupational History   Not on file  Tobacco Use   Smoking status: Never   Smokeless tobacco: Never  Vaping Use   Vaping Use: Never used  Substance and Sexual Activity   Alcohol use: No   Drug use: No   Sexual activity: Not on file  Other Topics Concern   Not on file  Social History Narrative   Not on file   Social Determinants of Health   Financial Resource Strain: Not on file  Food Insecurity: Not on file  Transportation Needs: Not on file  Physical Activity: Not on file  Stress: Not on file  Social Connections: Not on file  Intimate Partner Violence: Not on file   Family History  Problem Relation Age of Onset   Colon cancer Neg Hx       VITAL SIGNS BP 124/78   Pulse 80   Temp 98.3 F (36.8 C)   Resp 20   Ht '5\' 5"'$  (1.651 m)   Wt 121 lb 12.8 oz (55.2 kg)   SpO2 97%   BMI 20.27 kg/m   Outpatient Encounter Medications as of 04/28/2022  Medication  Sig   acetaminophen (TYLENOL) 500 MG tablet Take 1 tablet (500 mg total) by mouth every 6 (six) hours as needed for mild pain or moderate pain.   aspirin EC 81 MG tablet Take 81 mg by mouth daily.   Balsam [place]-Castor Oil (VENELEX) OINT Apply topically 3 (three) times daily. Apply to left buttock   Calcium Carbonate-Vitamin D (CALTRATE 600+D PO) Take 1 tablet by mouth 2 (two) times daily.   collagenase (SANTYL) 250 UNIT/GM ointment Apply 1 Application topically. Apply to wound right buttock per tx orders   docusate sodium (COLACE) 100 MG capsule Take 1 capsule (100 mg total) by mouth 2 (two) times daily as needed for mild constipation or moderate constipation.   enoxaparin  (LOVENOX) 40 MG/0.4ML injection Inject 0.4 mLs (40 mg total) into the skin daily for 21 days.   ibuprofen (ADVIL) 600 MG tablet Take 1 tablet (600 mg total) by mouth every 6 (six) hours as needed for mild pain.   NON FORMULARY Diet: Dysphagia 3   Nutritional Supplements (ENSURE MAX PROTEIN PO) Take by mouth daily.   ondansetron (ZOFRAN) 4 MG tablet Take 1 tablet (4 mg total) by mouth every 6 (six) hours as needed for nausea.   polyethylene glycol (MIRALAX / GLYCOLAX) 17 g packet Take 17 g by mouth daily as needed for moderate constipation or severe constipation.   SYNJARDY 12.02-999 MG TABS Take 1 tablet by mouth 2 (two) times daily.   traMADol (ULTRAM) 50 MG tablet Take 0.5 tablets (25 mg total) by mouth every 12 (twelve) hours as needed for moderate pain.   traZODone (DESYREL) 50 MG tablet Take 1 tablet (50 mg total) by mouth at bedtime as needed for sleep.   [DISCONTINUED] Menthol-Methyl Salicylate (MUSCLE RUB) 10-15 % CREA Apply 1 Application topically as needed for muscle pain.   No facility-administered encounter medications on file as of 04/28/2022.     SIGNIFICANT DIAGNOSTIC EXAMS  TODAY  04-19-22: left hip and  pelvic x-ray:  Moderately displaced proximal right femoral neck fracture. Moderately displaced left superior pubis ramus fracture.  LABS REVIEWED TODAY  04-19-22: wbc 10.8; hgb 13.1; hct 39.7; mcv 89.2 plt 204; glucose 155; bun 19; creat 0.82; k+ 4.1; na++ 136; ca 9.3; gfr >60 protein 6.4; albumin 3.8 04-22-22: wbc 6.7; hgb 13.5; hct 41.2; mcv 90.5 plt 237; glucose 174; bun 28; creat 1.19; k+ 4.5; na++ 133; ca 9.0; gfr 44; protein 6.5; albumin 3.4; total bili 1.3 vit D 42.05 04-24-22: wbc 6.6; hgb 11.7; hct 36.2; mcv 89.4 plt 189; glucose 210; bun 16; creat 0.76; k+ 4.6; na++ 133; ca 8.6; gfr >60    Review of Systems  Constitutional:  Negative for malaise/fatigue.  Respiratory:  Negative for cough and shortness of breath.   Cardiovascular:  Negative for chest pain,  palpitations and leg swelling.  Gastrointestinal:  Negative for abdominal pain, constipation and heartburn.  Musculoskeletal:  Negative for back pain, joint pain and myalgias.  Skin: Negative.   Neurological:  Negative for dizziness.  Psychiatric/Behavioral:  The patient is not nervous/anxious.     Physical Exam Constitutional:      General: Candice Ray is not in acute distress.    Appearance: Candice Ray is well-developed. Candice Ray is not diaphoretic.  Neck:     Thyroid: No thyromegaly.  Cardiovascular:     Rate and Rhythm: Normal rate and regular rhythm.     Pulses: Normal pulses.     Heart sounds: Normal heart sounds.  Pulmonary:     Effort: Pulmonary  effort is normal. No respiratory distress.     Breath sounds: Normal breath sounds.  Abdominal:     General: Bowel sounds are normal. There is no distension.     Palpations: Abdomen is soft.     Tenderness: There is no abdominal tenderness.  Musculoskeletal:     Cervical back: Neck supple.     Right lower leg: No edema.     Left lower leg: No edema.     Comments: Right hip hemiarthroplasty 04-22-22  left superior pubis ramus fracture.  Lymphadenopathy:     Cervical: No cervical adenopathy.  Skin:    General: Skin is warm and dry.     Comments: Right buttock pressure wound: 4 x 3 cm  Right hip incision line dressing intact   Neurological:     Mental Status: Candice Ray is alert. Mental status is at baseline.  Psychiatric:        Mood and Affect: Mood normal.     ASSESSMENT/ PLAN:  TODAY  Closed nondisplaced fracture of pelvis with routine healing subsequent encounter / closed fracture of right hip with routine healing subsequent encounter will continue therapy as directed and will follow up with orthopedics. Will continue lovenox 40 mg daily for 21 days. Asa 81 mg daily tylenol 500 mg every 6 hours as needed ultram 25 mg twice daily as needed   2. Aortic atherosclerosis (ct 03-18-21) not on statin due to advanced age; asa 81 mg daily   3. DM  (diabetes mellitus) type 2 with neurological complications: will continue synjardy 12.02/999 mg twice daily   4. Dementia without behavioral disturbance: weight is 121 pounds.   5. Schizophrenia unspecified type is on trazodone 50 mg nightly as needed    6. Moderate protein calorie malnutrition: will continue ensure daily protein 6.5 albumin 3.4     Candice Edwards NP [facility]  call [phone]

## 2022-04-29 ENCOUNTER — Non-Acute Institutional Stay (SKILLED_NURSING_FACILITY): Payer: Medicare Other | Admitting: Internal Medicine

## 2022-04-29 ENCOUNTER — Encounter: Payer: Self-pay | Admitting: Internal Medicine

## 2022-04-29 DIAGNOSIS — E1149 Type 2 diabetes mellitus with other diabetic neurological complication: Secondary | ICD-10-CM | POA: Diagnosis not present

## 2022-04-29 DIAGNOSIS — S329XXD Fracture of unspecified parts of lumbosacral spine and pelvis, subsequent encounter for fracture with routine healing: Secondary | ICD-10-CM | POA: Diagnosis not present

## 2022-04-29 DIAGNOSIS — L89301 Pressure ulcer of unspecified buttock, stage 1: Secondary | ICD-10-CM | POA: Diagnosis not present

## 2022-04-29 DIAGNOSIS — E440 Moderate protein-calorie malnutrition: Secondary | ICD-10-CM | POA: Insufficient documentation

## 2022-04-29 DIAGNOSIS — F0390 Unspecified dementia without behavioral disturbance: Secondary | ICD-10-CM

## 2022-04-29 DIAGNOSIS — S72001D Fracture of unspecified part of neck of right femur, subsequent encounter for closed fracture with routine healing: Secondary | ICD-10-CM

## 2022-04-29 NOTE — Assessment & Plan Note (Signed)
Present PTA SNF Wound Care Nurse monitoring resident. Refer to [facility] if progressive

## 2022-04-29 NOTE — Assessment & Plan Note (Signed)
Although interosseous wasting is present; total protein 6.4 and albumin 3.4, both only minimally decreased.  Nutritionist to monitor at [facility].

## 2022-04-29 NOTE — Assessment & Plan Note (Signed)
PT/OT at SNF as tolerated.  Compliance will be limited by her neurocognitive issues and profound deafness.

## 2022-04-29 NOTE — Assessment & Plan Note (Addendum)
6/20-6/27/2023 hospitalized after mechanical fall resulting in a proximal right femoral neck fracture and displaced left superior pubic ramus fracture.  Glucose range while hospitalized 107 up to high of 323.  Both outliers as most glucoses were in the 150-250 range.  NovoLog 3 times daily and sliding scale coverage while hospitalized.  Most recent A1c was 8.4% indicating adequate control in the context of advanced age and multiple comorbidities.

## 2022-04-29 NOTE — Assessment & Plan Note (Signed)
PT/OT as tolerated at [facility]

## 2022-04-29 NOTE — Patient Instructions (Signed)
See assessment and plan under each diagnosis in the problem list and acutely for this visit 

## 2022-04-29 NOTE — Progress Notes (Signed)
   NURSING HOME LOCATION:  [facility] ROOM NUMBER:    CODE STATUS:    PCP:     This is a comprehensive admission note to this SNFperformed on this date less than 30 days from date of admission. Included are preadmission medical/surgical history; reconciled medication list; family history; social history and comprehensive review of systems.  Corrections and additions to the records were documented. Comprehensive physical exam was also performed. Additionally a clinical summary was entered for each active diagnosis pertinent to this admission in the Problem List to enhance continuity of care.  HPI:  Past medical and surgical history:  Social history:  Family history:   Review of systems: Clinical neurocognitive deficits made validity of responses questionable & preventing ROS completion. Date given as Constitutional: No fever, significant weight change, fatigue  Eyes: No redness, discharge, pain, vision change ENT/mouth: No nasal congestion, purulent discharge, earache, change in hearing, sore throat  Cardiovascular: No chest pain, palpitations, paroxysmal nocturnal dyspnea, claudication, edema  Respiratory: No cough, sputum production, hemoptysis, DOE, significant snoring, apnea Gastrointestinal: No heartburn, dysphagia, abdominal pain, nausea /vomiting, rectal bleeding, melena, change in bowels Genitourinary: No dysuria, hematuria, pyuria, incontinence, nocturia Musculoskeletal: No joint stiffness, joint swelling, weakness, pain Dermatologic: No rash, pruritus, change in appearance of skin Neurologic: No dizziness, headache, syncope, seizures, numbness, tingling Psychiatric: No significant anxiety, depression, insomnia, anorexia Endocrine: No change in hair/skin/nails, excessive thirst, excessive hunger, excessive urination  Hematologic/lymphatic: No significant bruising, lymphadenopathy, abnormal bleeding Allergy/immunology: No itchy/watery eyes,  significant sneezing, urticaria, angioedema  Physical exam:  Pertinent or positive findings: General appearance: Adequately nourished; no acute distress, increased work of breathing is present.   Lymphatic: No lymphadenopathy about the head, neck, axilla. Eyes: No conjunctival inflammation or lid edema is present. There is no scleral icterus. Ears:  External ear exam shows no significant lesions or deformities.   Nose:  External nasal examination shows no deformity or inflammation. Nasal mucosa are pink and moist without lesions, exudates Oral exam: Lips and gums are healthy appearing.There is no oropharyngeal erythema or exudate. Neck:  No thyromegaly, masses, tenderness noted.    Heart:  Normal rate and regular rhythm. S1 and S2 normal without gallop, murmur, click, rub.  Lungs: Chest clear to auscultation without wheezes, rhonchi, rales, rubs. Abdomen: Bowel sounds are normal.  Abdomen is soft and nontender with no organomegaly, hernias, masses. GU: Deferred  Extremities:  No cyanosis, clubbing, edema. Neurologic exam:  Strength equal  in upper & lower extremities. Balance, Rhomberg, finger to nose testing could not be completed due to clinical state Deep tendon reflexes are equal Skin: Warm & dry w/o tenting. No significant lesions or rash.  See clinical summary under each active problem in the Problem List with associated updated therapeutic plan

## 2022-04-29 NOTE — Assessment & Plan Note (Signed)
When asked the month her response was "23".  She does comprehend that she fractured her hip.  No behavioral issues reported despite history of schizophrenia.

## 2022-04-29 NOTE — Progress Notes (Signed)
NURSING HOME LOCATION:  [facility] ROOM NUMBER:  129 P  CODE STATUS:  DNR  PCP:  Jani Gravel MD  This is a comprehensive admission note to this SNFperformed on this date less than 30 days from date of admission. Included are preadmission medical/surgical history; reconciled medication list; family history; social history and comprehensive review of systems.  Corrections and additions to the records were documented. Comprehensive physical exam was also performed. Additionally a clinical summary was entered for each active diagnosis pertinent to this admission in the Problem List to enhance continuity of care.  HPI: She was hospitalized 6/20 - 04/26/2022 with proximal right femoral neck fracture and displaced left superior pubic ramus fracture sustained in a mechanical fall when she attempted to ambulate without her walker.  Fall was witnessed and there was no evidence of any neurologic or cardiac prodrome.  The fall was in the context of history of neurocognitive deficit and schizophrenia with psychotic features. Right hip hemiarthroplasty was performed 6/23.  Postop Lovenox DVT prophylaxis was recommended x21 days. Hospital course was complicated by some psychotic behavior for which as needed Haldol was administered.  She was also on trazodone as needed at bedtime. NovoLog 3 times daily and sliding scale insulin were administered during the hospitalization.  Glucoses ranged from a low of 107 up to a high of 323.  Both were outliers as most glucoses were in the 150-250 range.  Most recent A1c was 8.4%.  Total protein was 6.4 and albumin 3.4.  CKD stage II was present with a creatinine of 0.82 and GFR greater than 60.  Nadir GFR was 44 and peak creatinine 1.19.  Predischarge creatinine was 0.76 and GFR greater than 60. Admission H/H was 13.1/39.7 with normochromic, normocytic indices.  Nadir H/H was 11.7/36.2. SLP recommended dysphagia 3 diet with thin liquids. Stage I pressure  ulcer of the mid buttocks was present on admission.   PT/OT recommended SNF placement for rehab.  Past medical and surgical history: Includes aortic atherosclerosis, diabetes with neurologic complications, schizophrenia, and history of adenomatous colon polyps. Surgeries and procedures include colonoscopy and colon polypectomy and hysteroscopic D&C.  Social history: Nondrinker; non-smoker.  Family history: Noncontributory due to advanced age.   Review of systems: Clinical neurocognitive deficits made validity of responses questionable ,preventing ROS completion. Date given as "Friday, 23."  Each time I asked her the month she would reply "23".  She denies any active issues except pain in the right foot.  She made the comment "need some Tylenol.".  When asked why she had been in the hospital she did say "broke my hip"; yet she does not complain of hip pain at this time.  Physical exam:  Pertinent or positive findings: She appears her stated age.  Facies tend to be blank but she has repetitive blinking of her eyes.  Facies are weathered.  Eyebrows are decreased laterally.  Hair is disheveled.  She is profoundly deaf.  She is edentulous except for a few anterior mandibular teeth.  Slight tachycardia is present.  Breath sounds are decreased.  Pedal pulses are decreased and asymmetric.  On the left the posterior tibial pulse appears stronger than the dorsalis pedis pulse.  The right dorsalis pedis pulse is clinically stronger than the right posterior tibial pulse.  She has trace-1/2+ edema at the sock line.  When the right foot was examined; the toes were upgoing.Isolated DIP OA changes present, RUE > LUE.  General appearance: no acute distress, increased work of  breathing is present.   Lymphatic: No lymphadenopathy about the head, neck, axilla. Eyes: No conjunctival inflammation or lid edema is present. There is no scleral icterus. Ears:  External ear exam shows no significant lesions or deformities.    Nose:  External nasal examination shows no deformity or inflammation. Nasal mucosa are pink and moist without lesions, exudates Neck:  No thyromegaly, masses, tenderness noted.    Heart:  No gallop, murmur, click, rub.  Lungs:  without wheezes, rhonchi, rales, rubs. Abdomen: Bowel sounds are normal.  Abdomen is soft and nontender with no organomegaly, hernias, masses. GU: Deferred  Extremities:  No cyanosis, clubbing Neurologic exam:  Balance, Rhomberg, finger to nose testing could not be completed due to clinical state Skin: Warm & dry w/o tenting. No significant visable lesions or rash.  See clinical summary under each active problem in the Problem List with associated updated therapeutic plan

## 2022-05-12 ENCOUNTER — Encounter: Payer: Self-pay | Admitting: Adult Health

## 2022-05-12 ENCOUNTER — Non-Acute Institutional Stay (SKILLED_NURSING_FACILITY): Payer: Medicare Other | Admitting: Adult Health

## 2022-05-12 ENCOUNTER — Other Ambulatory Visit: Payer: Self-pay | Admitting: Adult Health

## 2022-05-12 DIAGNOSIS — S329XXD Fracture of unspecified parts of lumbosacral spine and pelvis, subsequent encounter for fracture with routine healing: Secondary | ICD-10-CM

## 2022-05-12 DIAGNOSIS — S72001D Fracture of unspecified part of neck of right femur, subsequent encounter for closed fracture with routine healing: Secondary | ICD-10-CM

## 2022-05-12 DIAGNOSIS — I700 Atherosclerosis of aorta: Secondary | ICD-10-CM

## 2022-05-12 DIAGNOSIS — E1149 Type 2 diabetes mellitus with other diabetic neurological complication: Secondary | ICD-10-CM

## 2022-05-12 DIAGNOSIS — F0390 Unspecified dementia without behavioral disturbance: Secondary | ICD-10-CM

## 2022-05-12 MED ORDER — ONDANSETRON HCL 4 MG PO TABS: 4.0000 mg | ORAL_TABLET | Freq: Four times a day (QID) | ORAL | 0 refills | Status: DC | PRN

## 2022-05-12 MED ORDER — ENOXAPARIN SODIUM 40 MG/0.4ML IJ SOSY: 40.0000 mg | PREFILLED_SYRINGE | INTRAMUSCULAR | 0 refills | Status: DC

## 2022-05-12 MED ORDER — SANTYL 250 UNIT/GM EX OINT: 1.0000 | TOPICAL_OINTMENT | Freq: Every day | CUTANEOUS | 0 refills | Status: DC

## 2022-05-12 MED ORDER — SYNJARDY 12.5-1000 MG PO TABS: 1.0000 | ORAL_TABLET | Freq: Two times a day (BID) | ORAL | 0 refills | Status: DC

## 2022-05-12 NOTE — Progress Notes (Signed)
Location:  [place] Room Number: 129-P Place of Service:  SNF (31)  Provider: Ok Edwards np   PCP: Jani Gravel, MD Patient Care Team: Jani Gravel, MD as PCP - General (Internal Medicine) Altamese Alma, MD as Consulting Physician (Orthopedic Surgery)  Extended Emergency Contact Information Primary Emergency Contact: Leane Call, [place] [address] [address] [place] Phone: [phone] Mobile Phone: [phone] Relation: Relative Secondary Emergency Contact: [facility] Phone: [phone] Relation: Son  Code Status: dnr  Goals of care:  Advanced Directive information    05/13/2022    2:43 PM  Advanced Directives  Does Patient Have a Medical Advance Directive? Yes  Type of Advance Directive Out of facility DNR (pink MOST or yellow form)  Does patient want to make changes to medical advance directive? No - Patient declined  Pre-existing out of facility DNR order (yellow form or pink MOST form) Yellow form placed in chart (order not valid for inpatient use)     Allergies  Allergen Reactions   Contrast Media [Iodinated Contrast Media] Swelling   Metrizamide Swelling    Chief Complaint  Patient presents with   Discharge Note    HPI:  88 y.o. female  being discharged to home with home health for pt/ot. She will need a wheelchair and a hospital bed. She will need her prescriptions written. She had been hospitalized for a closed fracture of left hip. She was admitted to this facility for short term rehab. She has participated in pt/ot: to improve upon her independence with her adls. She is now ready to be discharged to home.     Past Medical History:  Diagnosis Date   Diabetes mellitus    HOH (hard of hearing)    Poor historian    Sinus trouble     Past Surgical History:  Procedure Laterality Date   COLONOSCOPY  2007   Dr. Gala Romney: tubular adenomas/rectal   COLONOSCOPY N/A 01/06/2014   QVZ:[id number]  diverticulosis. Colonic polyp-removed as described above. Status post segmental biopsy and stool collection for culture   HERNIA REPAIR     umbilical, [facility]   HIP ARTHROPLASTY Right 04/22/2022   Procedure: ARTHROPLASTY BIPOLAR HIP (HEMIARTHROPLASTY);  Surgeon: Altamese , MD;  Location: [place];  Service: Orthopedics;  Laterality: Right;   HYSTEROSCOPY WITH D & C  05/23/2012   Procedure: DILATATION AND CURETTAGE /HYSTEROSCOPY;  Surgeon: Florian Buff, MD;  Location: [facility];  Service: Gynecology;  Laterality: N/A;   POLYPECTOMY  05/23/2012   Procedure: POLYPECTOMY;  Surgeon: Florian Buff, MD;  Location: [facility];  Service: Gynecology;  Laterality: N/A;  endometrial polypectomy      reports that she has never smoked. She has never used smokeless tobacco. She reports that she does not drink alcohol and does not use drugs. Social History   Socioeconomic History   Marital status: Married    Spouse name: Not on file   Number of children: Not on file   Years of education: Not on file   Highest education level: Not on file  Occupational History   Not on file  Tobacco Use   Smoking status: Never   Smokeless tobacco: Never  Vaping Use   Vaping Use: Never used  Substance and Sexual Activity   Alcohol use: No   Drug use: No   Sexual activity: Not on file  Other Topics Concern   Not on file  Social History Narrative  Not on file   Social Determinants of Health   Financial Resource Strain: Not on file  Food Insecurity: Not on file  Transportation Needs: Not on file  Physical Activity: Not on file  Stress: Not on file  Social Connections: Not on file  Intimate Partner Violence: Not on file   Functional Status Survey:    Allergies  Allergen Reactions   Contrast Media [Iodinated Contrast Media] Swelling   Metrizamide Swelling    Pertinent  Health Maintenance Due  Topic Date Due   FOOT EXAM  Never done   OPHTHALMOLOGY EXAM  Never done   URINE MICROALBUMIN  Never done   DEXA  SCAN  Never done   INFLUENZA VACCINE  05/31/2022   HEMOGLOBIN A1C  07/02/2022    Medications: Outpatient Encounter Medications as of 05/12/2022  Medication Sig   acetaminophen (TYLENOL) 500 MG tablet Take 1 tablet (500 mg total) by mouth every 6 (six) hours as needed for mild pain or moderate pain.   aspirin EC 81 MG tablet Take 81 mg by mouth daily.   Balsam [place]-Castor Oil (VENELEX) OINT Apply topically 3 (three) times daily. Apply to left buttock   Calcium Carbonate-Vitamin D (CALTRATE 600+D PO) Take 1 tablet by mouth 2 (two) times daily.   collagenase (SANTYL) 250 UNIT/GM ointment Apply 1 Application topically daily. Apply to wound right buttock per tx orders   docusate sodium (COLACE) 100 MG capsule Take 1 capsule (100 mg total) by mouth 2 (two) times daily as needed for mild constipation or moderate constipation.   Empagliflozin-metFORMIN HCl (SYNJARDY) 12.02-999 MG TABS Take 1 tablet by mouth 2 (two) times daily.   enoxaparin (LOVENOX) 40 MG/0.4ML injection Inject 0.4 mLs (40 mg total) into the skin daily for 2 days.   NON FORMULARY Diet: Dysphagia 3 (puree) diet with thin liquids   Nutritional Supplements (ENSURE MAX PROTEIN PO) Take by mouth daily.   ondansetron (ZOFRAN) 4 MG tablet Take 1 tablet (4 mg total) by mouth every 6 (six) hours as needed for nausea.   polyethylene glycol (MIRALAX / GLYCOLAX) 17 g packet Take 17 g by mouth daily as needed for moderate constipation or severe constipation.   [DISCONTINUED] collagenase (SANTYL) 250 UNIT/GM ointment Apply 1 Application topically. Apply to wound right buttock per tx orders   [DISCONTINUED] enoxaparin (LOVENOX) 40 MG/0.4ML injection Inject 0.4 mLs (40 mg total) into the skin daily for 21 days.   [DISCONTINUED] ondansetron (ZOFRAN) 4 MG tablet Take 1 tablet (4 mg total) by mouth every 6 (six) hours as needed for nausea.   [DISCONTINUED] SYNJARDY 12.02-999 MG TABS Take 1 tablet by mouth 2 (two) times daily.   [DISCONTINUED] ibuprofen  (ADVIL) 600 MG tablet Take 1 tablet (600 mg total) by mouth every 6 (six) hours as needed for mild pain.   [DISCONTINUED] traMADol (ULTRAM) 50 MG tablet Take 0.5 tablets (25 mg total) by mouth every 12 (twelve) hours as needed for moderate pain.   [DISCONTINUED] traZODone (DESYREL) 50 MG tablet Take 1 tablet (50 mg total) by mouth at bedtime as needed for sleep.   No facility-administered encounter medications on file as of 05/12/2022.    Vitals:   05/12/22 1025  BP: 105/69  Pulse: 80  Resp: 20  Temp: 97.8 F (36.6 C)  SpO2: 94%  Weight: 117 lb 11.2 oz (53.4 kg)  Height: '5\' 5"'$  (1.651 m)   Body mass index is 19.59 kg/m.   TODAY  04-19-22: left hip and  pelvic x-ray:  Moderately displaced  proximal right femoral neck fracture. Moderately displaced left superior pubis ramus fracture.  LABS REVIEWED TODAY  04-19-22: wbc 10.8; hgb 13.1; hct 39.7; mcv 89.2 plt 204; glucose 155; bun 19; creat 0.82; k+ 4.1; na++ 136; ca 9.3; gfr >60 protein 6.4; albumin 3.8 04-22-22: wbc 6.7; hgb 13.5; hct 41.2; mcv 90.5 plt 237; glucose 174; bun 28; creat 1.19; k+ 4.5; na++ 133; ca 9.0; gfr 44; protein 6.5; albumin 3.4; total bili 1.3 vit D 42.05 04-24-22: wbc 6.6; hgb 11.7; hct 36.2; mcv 89.4 plt 189; glucose 210; bun 16; creat 0.76; k+ 4.6; na++ 133; ca 8.6; gfr >60    Review of Systems  Constitutional:  Negative for malaise/fatigue.  Respiratory:  Negative for cough and shortness of breath.   Cardiovascular:  Negative for chest pain, palpitations and leg swelling.  Gastrointestinal:  Negative for abdominal pain, constipation and heartburn.  Musculoskeletal:  Negative for back pain, joint pain and myalgias.  Skin: Negative.   Neurological:  Negative for dizziness.  Psychiatric/Behavioral:  The patient is not nervous/anxious.     Physical Exam Constitutional:      General: She is not in acute distress.    Appearance: She is well-developed. She is not diaphoretic.  Neck:     Thyroid: No  thyromegaly.  Cardiovascular:     Rate and Rhythm: Normal rate and regular rhythm.     Heart sounds: Normal heart sounds.  Pulmonary:     Effort: Pulmonary effort is normal. No respiratory distress.     Breath sounds: Normal breath sounds.  Abdominal:     General: Bowel sounds are normal. There is no distension.     Palpations: Abdomen is soft.     Tenderness: There is no abdominal tenderness.  Musculoskeletal:     Cervical back: Neck supple.     Right lower leg: No edema.     Left lower leg: No edema.     Comments: Right hip hemiarthroplasty 04-22-22  left superior pubis ramus fracture.   Lymphadenopathy:     Cervical: No cervical adenopathy.  Skin:    General: Skin is warm and dry.  Neurological:     Mental Status: She is alert. Mental status is at baseline.  Psychiatric:        Mood and Affect: Mood normal.       Assessment/Plan:    Patient is being discharged with the following home health services:  pt/ot to evaluate and treat as indicated for gait balance strength adl training.   Patient is being discharged with the following durable medical equipment:  will need a wheelchair and hospital bed.   Patient has been advised to f/u with their PCP in 1-2 weeks to for a transitions of care visit.  Social services at their facility was responsible for arranging this appointment.  Pt was provided with adequate prescriptions of noncontrolled medications to reach the scheduled appointment .  For controlled substances, a limited supply was provided as appropriate for the individual patient.  If the pt normally receives these medications from a pain clinic or has a contract with another physician, these medications should be received from that clinic or physician only).    A 30 day supply of her prescription medications have been sent to [facility] [address]  Time spent with patient 40 minutes: medications; home health dme.

## 2022-05-13 ENCOUNTER — Encounter: Payer: Self-pay | Admitting: Adult Health

## 2022-05-13 ENCOUNTER — Other Ambulatory Visit (HOSPITAL_COMMUNITY)
Admission: RE | Admit: 2022-05-13 | Discharge: 2022-05-13 | Disposition: A | Discharge status: Home / Self Care | Payer: Medicare Other | Source: Skilled Nursing Facility | Attending: Internal Medicine | Admitting: Internal Medicine

## 2022-05-13 DIAGNOSIS — M6282 Rhabdomyolysis: Secondary | ICD-10-CM | POA: Insufficient documentation

## 2022-05-13 LAB — CBC
HCT: 37.6 % (ref 36.0–46.0)
Hemoglobin: 12.4 g/dL (ref 12.0–15.0)
MCH: 29.2 pg (ref 26.0–34.0)
MCHC: 33 g/dL (ref 30.0–36.0)
MCV: 88.5 fL (ref 80.0–100.0)
Platelets: 245 10*3/uL (ref 150–400)
RBC: 4.25 MIL/uL (ref 3.87–5.11)
RDW: 13.8 % (ref 11.5–15.5)
WBC: 11.8 10*3/uL — ABNORMAL HIGH (ref 4.0–10.5)
nRBC: 0 % (ref 0.0–0.2)

## 2022-05-13 NOTE — Progress Notes (Unsigned)
Location:  [place] Room Number: 129-P Place of Service:  SNF (31)   CODE STATUS: DNR  Allergies  Allergen Reactions   Contrast Media [Iodinated Contrast Media] Swelling   Metrizamide Swelling    Chief Complaint  Patient presents with   Acute Visit    Cough and congestion     HPI:    Past Medical History:  Diagnosis Date   Diabetes mellitus    HOH (hard of hearing)    Poor historian    Sinus trouble     Past Surgical History:  Procedure Laterality Date   COLONOSCOPY  2007   Dr. Gala Romney: tubular adenomas/rectal   COLONOSCOPY N/A 01/06/2014   [id number] diverticulosis. Colonic polyp-removed as described above. Status post segmental biopsy and stool collection for culture   HERNIA REPAIR     umbilical, [facility]   HIP ARTHROPLASTY Right 04/22/2022   Procedure: ARTHROPLASTY BIPOLAR HIP (HEMIARTHROPLASTY);  Surgeon: Altamese Clarkston, MD;  Location: [place];  Service: Orthopedics;  Laterality: Right;   HYSTEROSCOPY WITH D & C  05/23/2012   Procedure: DILATATION AND CURETTAGE /HYSTEROSCOPY;  Surgeon: Florian Buff, MD;  Location: [facility];  Service: Gynecology;  Laterality: N/A;   POLYPECTOMY  05/23/2012   Procedure: POLYPECTOMY;  Surgeon: Florian Buff, MD;  Location: [facility];  Service: Gynecology;  Laterality: N/A;  endometrial polypectomy    Social History   Socioeconomic History   Marital status: Married    Spouse name: Not on file   Number of children: Not on file   Years of education: Not on file   Highest education level: Not on file  Occupational History   Not on file  Tobacco Use   Smoking status: Never   Smokeless tobacco: Never  Vaping Use   Vaping Use: Never used  Substance and Sexual Activity   Alcohol use: No   Drug use: No   Sexual activity: Not on file  Other Topics Concern   Not on file  Social History Narrative   Not on file   Social Determinants of Health   Financial Resource Strain: Not on file  Food Insecurity: Not on  file  Transportation Needs: Not on file  Physical Activity: Not on file  Stress: Not on file  Social Connections: Not on file  Intimate Partner Violence: Not on file   Family History  Problem Relation Age of Onset   Colon cancer Neg Hx       VITAL SIGNS BP 105/69   Pulse 80   Temp 97.8 F (36.6 C)   Resp 20   Ht '5\' 5"'$  (1.651 m)   Wt 114 lb 6.4 oz (51.9 kg)   SpO2 94%   BMI 19.04 kg/m   Outpatient Encounter Medications as of 05/13/2022  Medication Sig   acetaminophen (TYLENOL) 500 MG tablet Take 1 tablet (500 mg total) by mouth every 6 (six) hours as needed for mild pain or moderate pain.   aspirin EC 81 MG tablet Take 81 mg by mouth daily.   Balsam [place]-Castor Oil (VENELEX) OINT Apply topically 3 (three) times daily. Apply to left buttock   Calcium Carbonate-Vitamin D (CALTRATE 600+D PO) Take 1 tablet by mouth 2 (two) times daily.   collagenase (SANTYL) 250 UNIT/GM ointment Apply 1 Application topically daily. Apply to wound right buttock per tx orders   docusate sodium (COLACE) 100 MG capsule Take 1 capsule (100 mg total) by mouth 2 (two) times daily as needed for mild constipation or moderate  constipation.   Empagliflozin-metFORMIN HCl (SYNJARDY) 12.02-999 MG TABS Take 1 tablet by mouth 2 (two) times daily.   [START ON 05/15/2022] enoxaparin (LOVENOX) 40 MG/0.4ML injection Inject 0.4 mLs (40 mg total) into the skin daily for 2 days.   NON FORMULARY Diet: Dysphagia 3 (puree) diet with thin liquids   Nutritional Supplements (ENSURE MAX PROTEIN PO) Take by mouth daily.   ondansetron (ZOFRAN) 4 MG tablet Take 1 tablet (4 mg total) by mouth every 6 (six) hours as needed for nausea.   polyethylene glycol (MIRALAX / GLYCOLAX) 17 g packet Take 17 g by mouth daily as needed for moderate constipation or severe constipation.   No facility-administered encounter medications on file as of 05/13/2022.     SIGNIFICANT DIAGNOSTIC EXAMS       ASSESSMENT/ PLAN:     Candice Edwards  NP [facility]  Contact [phone] Monday through Friday 8am- 5pm  After hours call [phone]

## 2022-05-16 NOTE — Progress Notes (Signed)
This encounter was created in error - please disregard.

## 2022-05-20 ENCOUNTER — Encounter: Payer: Self-pay | Admitting: Adult Health

## 2022-05-20 ENCOUNTER — Non-Acute Institutional Stay (SKILLED_NURSING_FACILITY): Payer: Medicare Other | Admitting: Adult Health

## 2022-05-20 DIAGNOSIS — F0390 Unspecified dementia without behavioral disturbance: Secondary | ICD-10-CM

## 2022-05-20 DIAGNOSIS — E440 Moderate protein-calorie malnutrition: Secondary | ICD-10-CM

## 2022-05-20 DIAGNOSIS — I700 Atherosclerosis of aorta: Secondary | ICD-10-CM | POA: Diagnosis not present

## 2022-05-20 DIAGNOSIS — S72001D Fracture of unspecified part of neck of right femur, subsequent encounter for closed fracture with routine healing: Secondary | ICD-10-CM

## 2022-05-20 NOTE — Progress Notes (Signed)
Location:  [place] Room Number: 129 Place of Service:   SNF    CODE STATUS: dnr   Allergies  Allergen Reactions   Contrast Media [Iodinated Contrast Media] Swelling   Metrizamide Swelling    Chief Complaint  Patient presents with   Acute Visit    Care plan meeting    HPI:  We have come together for her care plan meeting. BIMS 14/15 mood 0/30. She is nonambulatory no falls. She requires limited to extensive assist with her adls. She is frequently incontinent of bladder and bowel. Dietary: weight is 114.4 pounds is on D3 diet; very poor appetite; does drink supplements. Therapy: she is reluctant to participate: ST: swallowing and cognition; PT sit/stand/pivot supervision; ambulates in therapy 50 feet with rolling walker; will decline at times. She will continue to be followed for her chronic illnesses including: Aortic atherosclerosis  Dementia without behavioral disturbance  Closed fracture of right hip with routine healing subsequent encounter  Moderate protein calorie malnutrition  Past Medical History:  Diagnosis Date   Diabetes mellitus    HOH (hard of hearing)    Poor historian    Sinus trouble     Past Surgical History:  Procedure Laterality Date   COLONOSCOPY  2007   Dr. Gala Romney: tubular adenomas/rectal   COLONOSCOPY N/A 01/06/2014   [id number] diverticulosis. Colonic polyp-removed as described above. Status post segmental biopsy and stool collection for culture   HERNIA REPAIR     umbilical, [facility]   HIP ARTHROPLASTY Right 04/22/2022   Procedure: ARTHROPLASTY BIPOLAR HIP (HEMIARTHROPLASTY);  Surgeon: Altamese Waco, MD;  Location: [place];  Service: Orthopedics;  Laterality: Right;   HYSTEROSCOPY WITH D & C  05/23/2012   Procedure: DILATATION AND CURETTAGE /HYSTEROSCOPY;  Surgeon: Florian Buff, MD;  Location: [facility];  Service: Gynecology;  Laterality: N/A;   POLYPECTOMY  05/23/2012   Procedure: POLYPECTOMY;  Surgeon: Florian Buff, MD;  [facility];  Service: Gynecology;  Laterality: N/A;  endometrial polypectomy    Social History   Socioeconomic History   Marital status: Married    Spouse name: Not on file   Number of children: Not on file   Years of education: Not on file   Highest education level: Not on file  Occupational History   Not on file  Tobacco Use   Smoking status: Never   Smokeless tobacco: Never  Vaping Use   Vaping Use: Never used  Substance and Sexual Activity   Alcohol use: No   Drug use: No   Sexual activity: Not on file  Other Topics Concern   Not on file  Social History Narrative   Not on file   Social Determinants of Health   Financial Resource Strain: Not on file  Food Insecurity: Not on file  Transportation Needs: Not on file  Physical Activity: Not on file  Stress: Not on file  Social Connections: Not on file  Intimate Partner Violence: Not on file   Family History  Problem Relation Age of Onset   Colon cancer Neg Hx       VITAL SIGNS BP 138/66   Pulse 65   Temp (!) 97.5 F (36.4 C)   Ht '5\' 5"'$  (1.651 m)   Wt 103 lb (46.7 kg)   BMI 17.14 kg/m   Outpatient Encounter Medications as of 05/20/2022  Medication Sig   acetaminophen (TYLENOL) 500 MG tablet Take 1 tablet (500 mg total) by mouth every 6 (six) hours as  needed for mild pain or moderate pain.   aspirin EC 81 MG tablet Take 81 mg by mouth daily.   Balsam [place]-Castor Oil (VENELEX) OINT Apply topically 3 (three) times daily. Apply to left buttock   Calcium Carbonate-Vitamin D (CALTRATE 600+D PO) Take 1 tablet by mouth 2 (two) times daily.   collagenase (SANTYL) 250 UNIT/GM ointment Apply 1 Application topically daily. Apply to wound right buttock per tx orders   docusate sodium (COLACE) 100 MG capsule Take 1 capsule (100 mg total) by mouth 2 (two) times daily as needed for mild constipation or moderate constipation.   Empagliflozin-metFORMIN HCl (SYNJARDY) 12.02-999 MG TABS Take 1 tablet by mouth 2 (two) times daily.    enoxaparin (LOVENOX) 40 MG/0.4ML injection Inject 0.4 mLs (40 mg total) into the skin daily for 2 days.   NON FORMULARY Diet: Dysphagia 3 (puree) diet with thin liquids   Nutritional Supplements (ENSURE MAX PROTEIN PO) Take by mouth daily.   ondansetron (ZOFRAN) 4 MG tablet Take 1 tablet (4 mg total) by mouth every 6 (six) hours as needed for nausea.   polyethylene glycol (MIRALAX / GLYCOLAX) 17 g packet Take 17 g by mouth daily as needed for moderate constipation or severe constipation.   No facility-administered encounter medications on file as of 05/20/2022.     SIGNIFICANT DIAGNOSTIC EXAMS   PREVIOUS   04-19-22: left hip and  pelvic x-ray:  Moderately displaced proximal right femoral neck fracture. Moderately displaced left superior pubis ramus fracture.  NO NEW EXAMS.   LABS REVIEWED PREVIOUS   04-19-22: wbc 10.8; hgb 13.1; hct 39.7; mcv 89.2 plt 204; glucose 155; bun 19; creat 0.82; k+ 4.1; na++ 136; ca 9.3; gfr >60 protein 6.4; albumin 3.8 04-22-22: wbc 6.7; hgb 13.5; hct 41.2; mcv 90.5 plt 237; glucose 174; bun 28; creat 1.19; k+ 4.5; na++ 133; ca 9.0; gfr 44; protein 6.5; albumin 3.4; total bili 1.3 vit D 42.05 04-24-22: wbc 6.6; hgb 11.7; hct 36.2; mcv 89.4 plt 189; glucose 210; bun 16; creat 0.76; k+ 4.6; na++ 133; ca 8.6; gfr >60   NO NEW LABS.   Review of Systems  Constitutional:  Negative for malaise/fatigue.  Respiratory:  Negative for cough and shortness of breath.   Cardiovascular:  Negative for chest pain, palpitations and leg swelling.  Gastrointestinal:  Negative for abdominal pain, constipation and heartburn.  Musculoskeletal:  Negative for back pain, joint pain and myalgias.  Skin: Negative.   Neurological:  Negative for dizziness.  Psychiatric/Behavioral:  The patient is not nervous/anxious.    Physical Exam Constitutional:      General: She is not in acute distress.    Appearance: She is well-developed. She is not diaphoretic.  Neck:     Thyroid: No  thyromegaly.  Cardiovascular:     Rate and Rhythm: Normal rate and regular rhythm.     Pulses: Normal pulses.     Heart sounds: Normal heart sounds.  Pulmonary:     Effort: Pulmonary effort is normal. No respiratory distress.     Breath sounds: Normal breath sounds.  Abdominal:     General: Bowel sounds are normal. There is no distension.     Palpations: Abdomen is soft.     Tenderness: There is no abdominal tenderness.  Musculoskeletal:     Cervical back: Neck supple.     Right lower leg: No edema.     Left lower leg: No edema.     Comments: Right hip hemiarthroplasty 04-22-22  left superior pubis ramus  fracture.   Lymphadenopathy:     Cervical: No cervical adenopathy.  Skin:    General: Skin is warm and dry.  Neurological:     Mental Status: She is alert. Mental status is at baseline.  Psychiatric:        Mood and Affect: Mood normal.       ASSESSMENT/ PLAN:  TODAY  Aortic atherosclerosis Dementia without behavioral disturbance Closed fracture of right hip with routine healing subsequent encounter Moderate protein calorie malnutrition  Will continue current medications;  Will continue therapy as directed Will continue to monitor her status.  Her goal is to return back home.  She has not been discharged to home; due  to continued therapy and family illness   Time spent with patient: 40 minutes: therapy; goals of care; medications therapy.    Ok Edwards NP [facility]   call [phone]

## 2022-05-23 ENCOUNTER — Other Ambulatory Visit (HOSPITAL_COMMUNITY)
Admission: RE | Admit: 2022-05-23 | Discharge: 2022-05-23 | Disposition: A | Discharge status: Home / Self Care | Payer: Medicare Other | Source: Skilled Nursing Facility | Attending: Adult Health | Admitting: Adult Health

## 2022-05-23 DIAGNOSIS — E1149 Type 2 diabetes mellitus with other diabetic neurological complication: Secondary | ICD-10-CM | POA: Insufficient documentation

## 2022-05-23 LAB — HEMOGLOBIN A1C
Hgb A1c MFr Bld: 8.9 % — ABNORMAL HIGH (ref 4.8–5.6)
Mean Plasma Glucose: 208.73 mg/dL

## 2022-05-24 ENCOUNTER — Non-Acute Institutional Stay (SKILLED_NURSING_FACILITY): Payer: Medicare Other | Admitting: Adult Health

## 2022-05-24 ENCOUNTER — Encounter: Payer: Self-pay | Admitting: Adult Health

## 2022-05-24 DIAGNOSIS — E1149 Type 2 diabetes mellitus with other diabetic neurological complication: Secondary | ICD-10-CM | POA: Diagnosis not present

## 2022-05-24 DIAGNOSIS — Z66 Do not resuscitate: Secondary | ICD-10-CM

## 2022-05-24 DIAGNOSIS — F209 Schizophrenia, unspecified: Secondary | ICD-10-CM | POA: Diagnosis not present

## 2022-05-24 DIAGNOSIS — E440 Moderate protein-calorie malnutrition: Secondary | ICD-10-CM

## 2022-05-24 NOTE — Progress Notes (Unsigned)
Location:  [place] Room Number: 129-P Place of Service:  SNF (31)   CODE STATUS: DNR  Allergies  Allergen Reactions   Contrast Media [Iodinated Contrast Media] Swelling   Metrizamide Swelling    Chief Complaint  Patient presents with   Medical Management of Chronic Issues    Routine visit, foot exam today. Discuss need for eye exam, urine microalbumin, shingrix, PCV, Dexa, and additional covid boosters or post pone if patient refuses.     HPI:    Past Medical History:  Diagnosis Date   Diabetes mellitus    HOH (hard of hearing)    Poor historian    Sinus trouble     Past Surgical History:  Procedure Laterality Date   COLONOSCOPY  2007   Dr. Gala Romney: tubular adenomas/rectal   COLONOSCOPY N/A 01/06/2014   [id number] diverticulosis. Colonic polyp-removed as described above. Status post segmental biopsy and stool collection for culture   HERNIA REPAIR     umbilical, [facility]   HIP ARTHROPLASTY Right 04/22/2022   Procedure: ARTHROPLASTY BIPOLAR HIP (HEMIARTHROPLASTY);  Surgeon: Altamese Newnan, MD;  Location: [place];  Service: Orthopedics;  Laterality: Right;   HYSTEROSCOPY WITH D & C  05/23/2012   Procedure: DILATATION AND CURETTAGE /HYSTEROSCOPY;  Surgeon: Florian Buff, MD;  Location[facility];  Service: Gynecology;  Laterality: N/A;   POLYPECTOMY  05/23/2012   Procedure: POLYPECTOMY;  Surgeon: Florian Buff, MD;  [facility];  Service: Gynecology;  Laterality: N/A;  endometrial polypectomy    Social History   Socioeconomic History   Marital status: Married    Spouse name: Not on file   Number of children: Not on file   Years of education: Not on file   Highest education level: Not on file  Occupational History   Not on file  Tobacco Use   Smoking status: Never   Smokeless tobacco: Never  Vaping Use   Vaping Use: Never used  Substance and Sexual Activity   Alcohol use: No   Drug use: No   Sexual activity: Not on file  Other Topics  Concern   Not on file  Social History Narrative   Not on file   Social Determinants of Health   Financial Resource Strain: Not on file  Food Insecurity: Not on file  Transportation Needs: Not on file  Physical Activity: Not on file  Stress: Not on file  Social Connections: Not on file  Intimate Partner Violence: Not on file   Family History  Problem Relation Age of Onset   Colon cancer Neg Hx       VITAL SIGNS BP 110/60   Pulse (!) 113   Temp (!) 97.3 F (36.3 C)   Resp 20   Ht '5\' 5"'$  (1.651 m)   Wt 109 lb (49.4 kg)   SpO2 96%   BMI 18.14 kg/m   Outpatient Encounter Medications as of 05/24/2022  Medication Sig   acetaminophen (TYLENOL) 500 MG tablet Take 1 tablet (500 mg total) by mouth every 6 (six) hours as needed for mild pain or moderate pain.   aspirin EC 81 MG tablet Take 81 mg by mouth daily.   Balsam [place]-Castor Oil (VENELEX) OINT Apply topically 3 (three) times daily. Apply to left buttock   collagenase (SANTYL) 250 UNIT/GM ointment Apply 1 Application topically daily. Apply to wound right buttock per tx orders   docusate sodium (COLACE) 100 MG capsule Take 1 capsule (100 mg total) by mouth 2 (two) times  daily as needed for mild constipation or moderate constipation.   Empagliflozin-metFORMIN HCl (SYNJARDY) 12.02-999 MG TABS Take 1 tablet by mouth 2 (two) times daily.   NON FORMULARY Diet: Dysphagia 3 (puree) diet with thin liquids   Nutritional Supplements (ENSURE MAX PROTEIN PO) Take by mouth daily.   ondansetron (ZOFRAN) 4 MG tablet Take 1 tablet (4 mg total) by mouth every 6 (six) hours as needed for nausea.   polyethylene glycol (MIRALAX / GLYCOLAX) 17 g packet Take 17 g by mouth daily as needed for moderate constipation or severe constipation.   UNABLE TO FIND Take 1 Canister by mouth daily at 12 noon. Magic cup   enoxaparin (LOVENOX) 40 MG/0.4ML injection Inject 0.4 mLs (40 mg total) into the skin daily for 2 days.   [DISCONTINUED] Calcium  Carbonate-Vitamin D (CALTRATE 600+D PO) Take 1 tablet by mouth 2 (two) times daily.   No facility-administered encounter medications on file as of 05/24/2022.     SIGNIFICANT DIAGNOSTIC EXAMS       ASSESSMENT/ PLAN:     Ok Edwards NP [facility]  Contact [phone] Monday through Friday 8am- 5pm  After hours call [phone]

## 2022-05-26 ENCOUNTER — Other Ambulatory Visit (HOSPITAL_COMMUNITY)
Admission: RE | Admit: 2022-05-26 | Discharge: 2022-05-26 | Disposition: A | Discharge status: Home / Self Care | Payer: Medicare Other | Source: Skilled Nursing Facility | Attending: Adult Health | Admitting: Adult Health

## 2022-05-27 LAB — MICROALBUMIN, URINE: Microalb, Ur: 67.7 ug/mL — ABNORMAL HIGH

## 2022-06-02 ENCOUNTER — Non-Acute Institutional Stay (SKILLED_NURSING_FACILITY): Payer: Medicare Other | Admitting: Adult Health

## 2022-06-02 ENCOUNTER — Encounter: Payer: Self-pay | Admitting: Adult Health

## 2022-06-02 ENCOUNTER — Other Ambulatory Visit: Payer: Self-pay | Admitting: Adult Health

## 2022-06-02 DIAGNOSIS — F0390 Unspecified dementia without behavioral disturbance: Secondary | ICD-10-CM | POA: Diagnosis not present

## 2022-06-02 DIAGNOSIS — I700 Atherosclerosis of aorta: Secondary | ICD-10-CM | POA: Diagnosis not present

## 2022-06-02 DIAGNOSIS — S72001D Fracture of unspecified part of neck of right femur, subsequent encounter for closed fracture with routine healing: Secondary | ICD-10-CM | POA: Diagnosis not present

## 2022-06-02 DIAGNOSIS — S329XXD Fracture of unspecified parts of lumbosacral spine and pelvis, subsequent encounter for fracture with routine healing: Secondary | ICD-10-CM | POA: Diagnosis not present

## 2022-06-02 MED ORDER — SYNJARDY 12.5-1000 MG PO TABS
1.0000 | ORAL_TABLET | Freq: Two times a day (BID) | ORAL | 0 refills | Status: DC
Start: 2022-06-02 — End: 2023-01-31

## 2022-06-02 MED ORDER — ONDANSETRON HCL 4 MG PO TABS: 4.0000 mg | ORAL_TABLET | Freq: Four times a day (QID) | ORAL | 0 refills | Status: DC | PRN

## 2022-06-02 MED ORDER — SANTYL 250 UNIT/GM EX OINT: 1.0000 | TOPICAL_OINTMENT | Freq: Every day | CUTANEOUS | 0 refills | Status: DC

## 2022-06-02 NOTE — Progress Notes (Signed)
Location:  [place] Room Number: 129 Place of Service:  SNF [phone])  Provider: Ok Edwards np   Extended Emergency Contact Information Primary Emergency Contact: [place], [place], [place] [address] Johnnette Litter of [place] Phone: [phone] Mobile Phone: [phone] Relation: Relative Secondary Emergency Contact: [facility] Phone: [phone] Relation: Son  Code Status: dnr  Goals of care:  Advanced Directive information    05/13/2022    2:43 PM  Advanced Directives  Does Patient Have a Medical Advance Directive? Yes  Type of Advance Directive Out of facility DNR (pink MOST or yellow form)  Does patient want to make changes to medical advance directive? No - Patient declined  Pre-existing out of facility DNR order (yellow form or pink MOST form) Yellow form placed in chart (order not valid for inpatient use)     Allergies  Allergen Reactions   Contrast Media [Iodinated Contrast Media] Swelling   Metrizamide Swelling    Chief Complaint  Patient presents with   Discharge Note    HPI:  88 y.o. female  is being discharged to home with home health for pt/ot. She will need a hospital bed; wheelchair and glucometer. She will need to follow up with her medical provider and will need her prescriptions written. She was hospitalized after a mechanical fall suffering a right femoral neck fracture with hip hemiarthroplasty on 04-22-22 and a left superior pubis ramus fracture. She was admitted to this facility for short term rehab. She has participated in pt/ot to improve upon her level of independence with her adls. She is now ready to return back home.      Past Medical History:  Diagnosis Date   Diabetes mellitus    HOH (hard of hearing)    Poor historian    Sinus trouble     Past Surgical History:  Procedure Laterality Date   COLONOSCOPY  2007   Dr. Gala Romney: tubular adenomas/rectal   COLONOSCOPY N/A 01/06/2014    [id number] diverticulosis. Colonic polyp-removed as described above. Status post segmental biopsy and stool collection for culture   HERNIA REPAIR     umbilical, [facility]   HIP ARTHROPLASTY Right 04/22/2022   Procedure: ARTHROPLASTY BIPOLAR HIP (HEMIARTHROPLASTY);  Surgeon: Altamese [place]ikapu, MD;  Location: [place];  Service: Orthopedics;  Laterality: Right;   HYSTEROSCOPY WITH D & C  05/23/2012   Procedure: DILATATION AND CURETTAGE /HYSTEROSCOPY;  Surgeon: Florian Buff, MD;  Location[facility];  Service: Gynecology;  Laterality: N/A;   POLYPECTOMY  05/23/2012   Procedure: POLYPECTOMY;  Surgeon: Florian Buff, MD;  Location: [facility];  Service: Gynecology;  Laterality: N/A;  endometrial polypectomy      reports that she has never smoked. She has never used smokeless tobacco. She reports that she does not drink alcohol and does not use drugs. Social History   Socioeconomic History   Marital status: Married    Spouse name: Not on file   Number of children: Not on file   Years of education: Not on file   Highest education level: Not on file  Occupational History   Not on file  Tobacco Use   Smoking status: Never   Smokeless tobacco: Never  Vaping Use   Vaping Use: Never used  Substance and Sexual Activity   Alcohol use: No   Drug use: No   Sexual activity: Not on file  Other Topics Concern   Not on file  Social History Narrative  Not on file   Social Determinants of Health   Financial Resource Strain: Not on file  Food Insecurity: Not on file  Transportation Needs: Not on file  Physical Activity: Not on file  Stress: Not on file  Social Connections: Not on file  Intimate Partner Violence: Not on file   Functional Status Survey:    Allergies  Allergen Reactions   Contrast Media [Iodinated Contrast Media] Swelling   Metrizamide Swelling    Pertinent  Health Maintenance Due  Topic Date Due   FOOT EXAM  Never done   OPHTHALMOLOGY EXAM  Never done   DEXA SCAN  Never done    INFLUENZA VACCINE  05/31/2022   HEMOGLOBIN A1C  11/23/2022   URINE MICROALBUMIN  05/27/2023    Medications: Outpatient Encounter Medications as of 06/02/2022  Medication Sig   acetaminophen (TYLENOL) 500 MG tablet Take 1 tablet (500 mg total) by mouth every 6 (six) hours as needed for mild pain or moderate pain.   aspirin EC 81 MG tablet Take 81 mg by mouth daily.   Balsam [place]-Castor Oil (VENELEX) OINT Apply topically 3 (three) times daily. Apply to left buttock   collagenase (SANTYL) 250 UNIT/GM ointment Apply 1 Application topically daily. Apply to wound right buttock per tx orders   docusate sodium (COLACE) 100 MG capsule Take 1 capsule (100 mg total) by mouth 2 (two) times daily as needed for mild constipation or moderate constipation.   Empagliflozin-metFORMIN HCl (SYNJARDY) 12.02-999 MG TABS Take 1 tablet by mouth 2 (two) times daily.   NON FORMULARY Diet: Dysphagia 3 (puree) diet with thin liquids   Nutritional Supplements (ENSURE MAX PROTEIN PO) Take by mouth daily.   ondansetron (ZOFRAN) 4 MG tablet Take 1 tablet (4 mg total) by mouth every 6 (six) hours as needed for nausea.   polyethylene glycol (MIRALAX / GLYCOLAX) 17 g packet Take 17 g by mouth daily as needed for moderate constipation or severe constipation.   UNABLE TO FIND Take 1 Canister by mouth daily at 12 noon. Magic cup   No facility-administered encounter medications on file as of 06/02/2022.    Vitals:   06/02/22 1327  BP: 117/80  Pulse: 96  Resp: 20  Temp: 97.6 F (36.4 C)  Weight: 109 lb 6.4 oz (49.6 kg)  Height: '5\' 5"'$  (1.651 m)   Body mass index is 18.21 kg/m.   SIGNIFICANT DIAGNOSTIC EXAMS  PREVIOUS   04-19-22: left hip and  pelvic x-ray:  Moderately displaced proximal right femoral neck fracture. Moderately displaced left superior pubis ramus fracture.  NO NEW EXAMS.   LABS REVIEWED PREVIOUS   04-19-22: wbc 10.8; hgb 13.1; hct 39.7; mcv 89.2 plt 204; glucose 155; bun 19; creat 0.82; k+ 4.1; na++  136; ca 9.3; gfr >60 protein 6.4; albumin 3.8 04-22-22: wbc 6.7; hgb 13.5; hct 41.2; mcv 90.5 plt 237; glucose 174; bun 28; creat 1.19; k+ 4.5; na++ 133; ca 9.0; gfr 44; protein 6.5; albumin 3.4; total bili 1.3 vit D 42.05 04-24-22: wbc 6.6; hgb 11.7; hct 36.2; mcv 89.4 plt 189; glucose 210; bun 16; creat 0.76; k+ 4.6; na++ 133; ca 8.6; gfr >60  05-13-24: wbc 11.8; hgb 12.4; hct 37.6; mcv 88.5 plt 245 05-23-22: hgb a1c 8.9    NO NEW LABS.   Review of Systems  Reason unable to perform ROS: unable to fully participate.    Physical Exam Constitutional:      General: She is not in acute distress.    Appearance: She is  well-developed. She is not diaphoretic.  Neck:     Thyroid: No thyromegaly.  Cardiovascular:     Rate and Rhythm: Normal rate and regular rhythm.     Pulses: Normal pulses.     Heart sounds: Normal heart sounds.  Pulmonary:     Effort: Pulmonary effort is normal. No respiratory distress.     Breath sounds: Normal breath sounds.  Abdominal:     General: Bowel sounds are normal. There is no distension.     Palpations: Abdomen is soft.     Tenderness: There is no abdominal tenderness.  Musculoskeletal:     Cervical back: Neck supple.     Right lower leg: No edema.     Left lower leg: No edema.     Comments: Right hip hemiarthroplasty 04-22-22  left superior pubis ramus fracture.   Able to move all extremities    Lymphadenopathy:     Cervical: No cervical adenopathy.  Skin:    General: Skin is warm and dry.  Neurological:     Mental Status: She is alert. Mental status is at baseline.  Psychiatric:        Mood and Affect: Mood normal.      Assessment/Plan:    Patient is being discharged with the following home health services: pt/ot to evaluate and treat as indicated for gait balance strength adl training    Patient is being discharged with the following durable medical equipment:  hospital bed; wheelchair; glucometer   Patient has been advised to f/u with their  PCP in 1-2 weeks to for a transitions of care visit.  Social services at their facility was responsible for arranging this appointment.  Pt was provided with adequate prescriptions of noncontrolled medications to reach the scheduled appointment .  For controlled substances, a limited supply was provided as appropriate for the individual patient.  If the pt normally receives these medications from a pain clinic or has a contract with another physician, these medications should be received from that clinic or physician only).    A 30 day supply of her prescription medications have been sent to [facility] on [address].    Time spent with patient 40 minutes: medications; home health; dme.

## 2023-01-06 ENCOUNTER — Inpatient Hospital Stay (HOSPITAL_COMMUNITY): Payer: Medicare Other | Admitting: Anesthesiology

## 2023-01-06 ENCOUNTER — Inpatient Hospital Stay (HOSPITAL_COMMUNITY): Payer: Medicare Other

## 2023-01-06 ENCOUNTER — Other Ambulatory Visit: Payer: Self-pay

## 2023-01-06 ENCOUNTER — Emergency Department (HOSPITAL_COMMUNITY): Payer: Medicare Other

## 2023-01-06 ENCOUNTER — Inpatient Hospital Stay (HOSPITAL_COMMUNITY)
Admission: EM | Admit: 2023-01-06 | Discharge: 2023-01-13 | DRG: 522 | Disposition: A | Discharge status: Skilled Nursing Facility | Payer: Medicare Other | Attending: Internal Medicine | Admitting: Internal Medicine

## 2023-01-06 ENCOUNTER — Encounter (HOSPITAL_COMMUNITY): Payer: Self-pay | Admitting: Pharmacy Technician

## 2023-01-06 ENCOUNTER — Encounter (HOSPITAL_COMMUNITY)
Admission: EM | Disposition: A | Discharge status: Skilled Nursing Facility | Payer: Self-pay | Source: Home / Self Care | Attending: Internal Medicine

## 2023-01-06 DIAGNOSIS — F0390 Unspecified dementia without behavioral disturbance: Secondary | ICD-10-CM | POA: Diagnosis present

## 2023-01-06 DIAGNOSIS — F209 Schizophrenia, unspecified: Secondary | ICD-10-CM | POA: Diagnosis present

## 2023-01-06 DIAGNOSIS — S72012A Unspecified intracapsular fracture of left femur, initial encounter for closed fracture: Principal | ICD-10-CM | POA: Diagnosis present

## 2023-01-06 DIAGNOSIS — Z91041 Radiographic dye allergy status: Secondary | ICD-10-CM

## 2023-01-06 DIAGNOSIS — R030 Elevated blood-pressure reading, without diagnosis of hypertension: Secondary | ICD-10-CM | POA: Diagnosis present

## 2023-01-06 DIAGNOSIS — E1149 Type 2 diabetes mellitus with other diabetic neurological complication: Secondary | ICD-10-CM | POA: Diagnosis present

## 2023-01-06 DIAGNOSIS — E871 Hypo-osmolality and hyponatremia: Secondary | ICD-10-CM | POA: Diagnosis not present

## 2023-01-06 DIAGNOSIS — R000 Tachycardia, unspecified: Secondary | ICD-10-CM | POA: Diagnosis present

## 2023-01-06 DIAGNOSIS — R296 Repeated falls: Secondary | ICD-10-CM | POA: Diagnosis present

## 2023-01-06 DIAGNOSIS — K9189 Other postprocedural complications and disorders of digestive system: Secondary | ICD-10-CM | POA: Diagnosis not present

## 2023-01-06 DIAGNOSIS — S72002A Fracture of unspecified part of neck of left femur, initial encounter for closed fracture: Secondary | ICD-10-CM

## 2023-01-06 DIAGNOSIS — Z86010 Personal history of colonic polyps: Secondary | ICD-10-CM

## 2023-01-06 DIAGNOSIS — E876 Hypokalemia: Secondary | ICD-10-CM | POA: Diagnosis not present

## 2023-01-06 DIAGNOSIS — Z7982 Long term (current) use of aspirin: Secondary | ICD-10-CM

## 2023-01-06 DIAGNOSIS — E1165 Type 2 diabetes mellitus with hyperglycemia: Secondary | ICD-10-CM | POA: Diagnosis present

## 2023-01-06 DIAGNOSIS — K567 Ileus, unspecified: Secondary | ICD-10-CM | POA: Diagnosis not present

## 2023-01-06 DIAGNOSIS — Z888 Allergy status to other drugs, medicaments and biological substances status: Secondary | ICD-10-CM | POA: Diagnosis not present

## 2023-01-06 DIAGNOSIS — R1310 Dysphagia, unspecified: Secondary | ICD-10-CM | POA: Diagnosis present

## 2023-01-06 DIAGNOSIS — E119 Type 2 diabetes mellitus without complications: Secondary | ICD-10-CM | POA: Diagnosis not present

## 2023-01-06 DIAGNOSIS — Z794 Long term (current) use of insulin: Secondary | ICD-10-CM | POA: Diagnosis not present

## 2023-01-06 DIAGNOSIS — N179 Acute kidney failure, unspecified: Secondary | ICD-10-CM | POA: Diagnosis not present

## 2023-01-06 DIAGNOSIS — S72012S Unspecified intracapsular fracture of left femur, sequela: Secondary | ICD-10-CM | POA: Diagnosis present

## 2023-01-06 DIAGNOSIS — Z66 Do not resuscitate: Secondary | ICD-10-CM | POA: Diagnosis present

## 2023-01-06 DIAGNOSIS — Z96641 Presence of right artificial hip joint: Secondary | ICD-10-CM | POA: Diagnosis present

## 2023-01-06 DIAGNOSIS — Z1152 Encounter for screening for COVID-19: Secondary | ICD-10-CM

## 2023-01-06 DIAGNOSIS — S7292XA Unspecified fracture of left femur, initial encounter for closed fracture: Principal | ICD-10-CM

## 2023-01-06 DIAGNOSIS — W19XXXA Unspecified fall, initial encounter: Secondary | ICD-10-CM | POA: Diagnosis present

## 2023-01-06 DIAGNOSIS — Z7984 Long term (current) use of oral hypoglycemic drugs: Secondary | ICD-10-CM | POA: Diagnosis not present

## 2023-01-06 DIAGNOSIS — H903 Sensorineural hearing loss, bilateral: Secondary | ICD-10-CM | POA: Diagnosis present

## 2023-01-06 DIAGNOSIS — I493 Ventricular premature depolarization: Secondary | ICD-10-CM | POA: Diagnosis not present

## 2023-01-06 HISTORY — PX: HIP ARTHROPLASTY: SHX981

## 2023-01-06 HISTORY — DX: Unspecified intracapsular fracture of left femur, sequela: S72.012S

## 2023-01-06 LAB — CBC WITH DIFFERENTIAL/PLATELET
Abs Immature Granulocytes: 0.03 10*3/uL (ref 0.00–0.07)
Basophils Absolute: 0 10*3/uL (ref 0.0–0.1)
Basophils Relative: 1 %
Eosinophils Absolute: 0.1 10*3/uL (ref 0.0–0.5)
Eosinophils Relative: 1 %
HCT: 40.9 % (ref 36.0–46.0)
Hemoglobin: 13.3 g/dL (ref 12.0–15.0)
Immature Granulocytes: 1 %
Lymphocytes Relative: 17 %
Lymphs Abs: 0.9 10*3/uL (ref 0.7–4.0)
MCH: 28.7 pg (ref 26.0–34.0)
MCHC: 32.5 g/dL (ref 30.0–36.0)
MCV: 88.1 fL (ref 80.0–100.0)
Monocytes Absolute: 0.5 10*3/uL (ref 0.1–1.0)
Monocytes Relative: 9 %
Neutro Abs: 3.8 10*3/uL (ref 1.7–7.7)
Neutrophils Relative %: 71 %
Platelets: 204 10*3/uL (ref 150–400)
RBC: 4.64 MIL/uL (ref 3.87–5.11)
RDW: 13.6 % (ref 11.5–15.5)
WBC: 5.2 10*3/uL (ref 4.0–10.5)
nRBC: 0 % (ref 0.0–0.2)

## 2023-01-06 LAB — GLUCOSE, CAPILLARY
Glucose-Capillary: 165 mg/dL — ABNORMAL HIGH (ref 70–99)
Glucose-Capillary: 154 mg/dL — ABNORMAL HIGH (ref 70–99)

## 2023-01-06 LAB — BASIC METABOLIC PANEL
Anion gap: 8 (ref 5–15)
BUN: 28 mg/dL — ABNORMAL HIGH (ref 8–23)
CO2: 26 mmol/L (ref 22–32)
Calcium: 8.9 mg/dL (ref 8.9–10.3)
Chloride: 98 mmol/L (ref 98–111)
Creatinine, Ser: 1.01 mg/dL — ABNORMAL HIGH (ref 0.44–1.00)
GFR, Estimated: 53 mL/min — ABNORMAL LOW (ref >60)
Glucose, Bld: 258 mg/dL — ABNORMAL HIGH (ref 70–99)
Potassium: 4.3 mmol/L (ref 3.5–5.1)
Sodium: 132 mmol/L — ABNORMAL LOW (ref 135–145)

## 2023-01-06 LAB — SURGICAL PCR SCREEN
MRSA, PCR: POSITIVE — AB
Staphylococcus aureus: POSITIVE — AB

## 2023-01-06 LAB — CBC
HCT: 36.3 % (ref 36.0–46.0)
Hemoglobin: 12.2 g/dL (ref 12.0–15.0)
MCH: 29.3 pg (ref 26.0–34.0)
MCHC: 33.6 g/dL (ref 30.0–36.0)
MCV: 87.1 fL (ref 80.0–100.0)
Platelets: 185 10*3/uL (ref 150–400)
RBC: 4.17 MIL/uL (ref 3.87–5.11)
RDW: 13.4 % (ref 11.5–15.5)
WBC: 10.3 10*3/uL (ref 4.0–10.5)
nRBC: 0 % (ref 0.0–0.2)

## 2023-01-06 LAB — PROTIME-INR
INR: 1.1 (ref 0.8–1.2)
Prothrombin Time: 14 seconds (ref 11.4–15.2)

## 2023-01-06 LAB — CREATININE, SERUM
Creatinine, Ser: 0.89 mg/dL (ref 0.44–1.00)
GFR, Estimated: >60 mL/min (ref >60)

## 2023-01-06 SURGERY — HEMIARTHROPLASTY, HIP, DIRECT ANTERIOR APPROACH, FOR FRACTURE
Anesthesia: Monitor Anesthesia Care | Site: Hip | Laterality: Left

## 2023-01-06 MED ORDER — PRONTOSAN WOUND IRRIGATION OPTIME
TOPICAL | Status: DC | PRN
  Administered 2023-01-06: 450 mL via TOPICAL

## 2023-01-06 MED ORDER — ASPIRIN 81 MG PO TBEC
81.0000 mg | DELAYED_RELEASE_TABLET | Freq: Every day | ORAL | Status: DC
  Administered 2023-01-07: 81 mg via ORAL
  Filled 2023-01-06: qty 1

## 2023-01-06 MED ORDER — CEFAZOLIN SODIUM-DEXTROSE 2-4 GM/100ML-% IV SOLN: 2.0000 g | Freq: Four times a day (QID) | INTRAVENOUS | Status: DC

## 2023-01-06 MED ORDER — LACTATED RINGERS IV SOLN: INTRAVENOUS | Status: DC

## 2023-01-06 MED ORDER — ENSURE MAX PROTEIN PO LIQD
237.0000 mL | Freq: Every day | ORAL | Status: DC
  Administered 2023-01-07 – 2023-01-13 (×7): 237 mL via ORAL
  Filled 2023-01-06 (×8): qty 330

## 2023-01-06 MED ORDER — VANCOMYCIN HCL 1000 MG IV SOLR
INTRAVENOUS | Status: AC
  Filled 2023-01-06: qty 20

## 2023-01-06 MED ORDER — PHENYLEPHRINE 80 MCG/ML (10ML) SYRINGE FOR IV PUSH (FOR BLOOD PRESSURE SUPPORT)
PREFILLED_SYRINGE | INTRAVENOUS | Status: DC | PRN
  Administered 2023-01-06: 160 ug via INTRAVENOUS
  Administered 2023-01-06: 80 ug via INTRAVENOUS

## 2023-01-06 MED ORDER — MAGNESIUM CITRATE PO SOLN: 1.0000 | Freq: Once | ORAL | Status: DC | PRN

## 2023-01-06 MED ORDER — CEFAZOLIN SODIUM-DEXTROSE 2-4 GM/100ML-% IV SOLN
2.0000 g | INTRAVENOUS | Status: AC
  Administered 2023-01-06: 2 g via INTRAVENOUS
  Filled 2023-01-06: qty 100

## 2023-01-06 MED ORDER — SORBITOL 70 % SOLN: 30.0000 mL | Freq: Every day | Status: DC | PRN

## 2023-01-06 MED ORDER — TRANEXAMIC ACID-NACL 1000-0.7 MG/100ML-% IV SOLN
1000.0000 mg | Freq: Once | INTRAVENOUS | Status: AC
  Administered 2023-01-06: 1000 mg via INTRAVENOUS
  Filled 2023-01-06: qty 100

## 2023-01-06 MED ORDER — MUPIROCIN 2 % EX OINT
1.0000 | TOPICAL_OINTMENT | Freq: Two times a day (BID) | CUTANEOUS | Status: AC
  Administered 2023-01-06 – 2023-01-10 (×7): 1 via NASAL
  Filled 2023-01-06 (×3): qty 22

## 2023-01-06 MED ORDER — DOCUSATE SODIUM 100 MG PO CAPS
100.0000 mg | ORAL_CAPSULE | Freq: Two times a day (BID) | ORAL | Status: DC
  Administered 2023-01-06 – 2023-01-13 (×12): 100 mg via ORAL
  Filled 2023-01-06 (×13): qty 1

## 2023-01-06 MED ORDER — ONDANSETRON HCL 4 MG/2ML IJ SOLN: 4.0000 mg | Freq: Four times a day (QID) | INTRAMUSCULAR | Status: DC | PRN

## 2023-01-06 MED ORDER — HYDROMORPHONE HCL 1 MG/ML IJ SOLN: 0.5000 mg | INTRAMUSCULAR | Status: DC | PRN

## 2023-01-06 MED ORDER — ACETAMINOPHEN 500 MG PO TABS
1000.0000 mg | ORAL_TABLET | Freq: Four times a day (QID) | ORAL | Status: AC
  Administered 2023-01-07 (×3): 1000 mg via ORAL
  Filled 2023-01-06 (×3): qty 2

## 2023-01-06 MED ORDER — OXYCODONE HCL 5 MG PO TABS: 10.0000 mg | ORAL_TABLET | ORAL | Status: DC | PRN

## 2023-01-06 MED ORDER — PROPOFOL 500 MG/50ML IV EMUL
INTRAVENOUS | Status: DC | PRN
  Administered 2023-01-06: 50 ug/kg/min via INTRAVENOUS

## 2023-01-06 MED ORDER — POVIDONE-IODINE 10 % EX SWAB
2.0000 | Freq: Once | CUTANEOUS | Status: AC
  Administered 2023-01-06: 2 via TOPICAL

## 2023-01-06 MED ORDER — TRAZODONE HCL 50 MG PO TABS: 50.0000 mg | ORAL_TABLET | Freq: Every evening | ORAL | Status: DC | PRN

## 2023-01-06 MED ORDER — SODIUM CHLORIDE 0.9 % IV SOLN: INTRAVENOUS | Status: DC

## 2023-01-06 MED ORDER — OXYCODONE HCL 5 MG PO TABS
5.0000 mg | ORAL_TABLET | ORAL | Status: DC | PRN
  Filled 2023-01-06: qty 1

## 2023-01-06 MED ORDER — SENNOSIDES-DOCUSATE SODIUM 8.6-50 MG PO TABS
2.0000 | ORAL_TABLET | Freq: Every day | ORAL | Status: DC
  Administered 2023-01-06 – 2023-01-12 (×4): 2 via ORAL
  Filled 2023-01-06 (×6): qty 2

## 2023-01-06 MED ORDER — AMISULPRIDE (ANTIEMETIC) 5 MG/2ML IV SOLN: 10.0000 mg | Freq: Once | INTRAVENOUS | Status: DC | PRN

## 2023-01-06 MED ORDER — FENTANYL CITRATE PF 50 MCG/ML IJ SOSY: 25.0000 ug | PREFILLED_SYRINGE | INTRAMUSCULAR | Status: DC | PRN

## 2023-01-06 MED ORDER — MENTHOL 3 MG MT LOZG: 1.0000 | LOZENGE | OROMUCOSAL | Status: DC | PRN

## 2023-01-06 MED ORDER — LORAZEPAM 2 MG/ML IJ SOLN: 0.5000 mg | Freq: Four times a day (QID) | INTRAMUSCULAR | Status: DC | PRN

## 2023-01-06 MED ORDER — CHLORHEXIDINE GLUCONATE 0.12 % MT SOLN
OROMUCOSAL | Status: AC
  Administered 2023-01-06: 15 mL via OROMUCOSAL
  Filled 2023-01-06: qty 15

## 2023-01-06 MED ORDER — TRANEXAMIC ACID 1000 MG/10ML IV SOLN
2000.0000 mg | INTRAVENOUS | Status: AC
  Administered 2023-01-06: 2000 mg via TOPICAL
  Filled 2023-01-06 (×2): qty 20

## 2023-01-06 MED ORDER — SODIUM CHLORIDE 0.9 % IR SOLN
Status: DC | PRN
  Administered 2023-01-06: 1000 mL

## 2023-01-06 MED ORDER — BISACODYL 10 MG RE SUPP: 10.0000 mg | Freq: Every day | RECTAL | Status: DC | PRN

## 2023-01-06 MED ORDER — POLYETHYLENE GLYCOL 3350 17 G PO PACK: 17.0000 g | PACK | Freq: Every day | ORAL | Status: DC | PRN

## 2023-01-06 MED ORDER — METHOCARBAMOL 500 MG PO TABS: 500.0000 mg | ORAL_TABLET | Freq: Four times a day (QID) | ORAL | Status: DC | PRN

## 2023-01-06 MED ORDER — SODIUM CHLORIDE 0.9% FLUSH
3.0000 mL | Freq: Two times a day (BID) | INTRAVENOUS | Status: DC
  Administered 2023-01-07 – 2023-01-12 (×10): 3 mL via INTRAVENOUS

## 2023-01-06 MED ORDER — BUPIVACAINE-MELOXICAM ER 400-12 MG/14ML IJ SOLN
INTRAMUSCULAR | Status: AC
  Filled 2023-01-06: qty 1

## 2023-01-06 MED ORDER — INSULIN ASPART 100 UNIT/ML IJ SOLN
0.0000 [IU] | Freq: Every day | INTRAMUSCULAR | Status: DC
  Administered 2023-01-08 – 2023-01-11 (×2): 2 [IU] via SUBCUTANEOUS

## 2023-01-06 MED ORDER — INSULIN ASPART 100 UNIT/ML IJ SOLN
0.0000 [IU] | Freq: Three times a day (TID) | INTRAMUSCULAR | Status: DC
  Administered 2023-01-06 – 2023-01-07 (×3): 1 [IU] via SUBCUTANEOUS
  Administered 2023-01-08: 3 [IU] via SUBCUTANEOUS
  Administered 2023-01-08: 4 [IU] via SUBCUTANEOUS
  Administered 2023-01-09 (×2): 3 [IU] via SUBCUTANEOUS
  Administered 2023-01-09: 1 [IU] via SUBCUTANEOUS
  Administered 2023-01-10: 2 [IU] via SUBCUTANEOUS
  Administered 2023-01-10: 3 [IU] via SUBCUTANEOUS
  Administered 2023-01-10: 2 [IU] via SUBCUTANEOUS
  Administered 2023-01-11: 3 [IU] via SUBCUTANEOUS
  Administered 2023-01-11 (×2): 2 [IU] via SUBCUTANEOUS
  Administered 2023-01-12: 4 [IU] via SUBCUTANEOUS
  Administered 2023-01-12 (×2): 2 [IU] via SUBCUTANEOUS
  Administered 2023-01-13: 3 [IU] via SUBCUTANEOUS

## 2023-01-06 MED ORDER — BUPIVACAINE IN DEXTROSE 0.75-8.25 % IT SOLN
INTRATHECAL | Status: DC | PRN
  Administered 2023-01-06: 1.6 mL via INTRATHECAL

## 2023-01-06 MED ORDER — ONDANSETRON HCL 4 MG PO TABS: 4.0000 mg | ORAL_TABLET | Freq: Four times a day (QID) | ORAL | Status: DC | PRN

## 2023-01-06 MED ORDER — FENTANYL CITRATE (PF) 100 MCG/2ML IJ SOLN: 25.0000 ug | INTRAMUSCULAR | Status: DC | PRN

## 2023-01-06 MED ORDER — VANCOMYCIN HCL 1 G IV SOLR
INTRAVENOUS | Status: DC | PRN
  Administered 2023-01-06: 1000 mg

## 2023-01-06 MED ORDER — SODIUM CHLORIDE 0.9 % IV SOLN: INTRAVENOUS | Status: DC | PRN

## 2023-01-06 MED ORDER — PROPOFOL 10 MG/ML IV BOLUS
INTRAVENOUS | Status: DC | PRN
  Administered 2023-01-06 (×3): 20 mg via INTRAVENOUS
  Administered 2023-01-06: 10 mg via INTRAVENOUS

## 2023-01-06 MED ORDER — SODIUM CHLORIDE 0.9% FLUSH
3.0000 mL | Freq: Two times a day (BID) | INTRAVENOUS | Status: DC
  Administered 2023-01-07 – 2023-01-13 (×6): 3 mL via INTRAVENOUS

## 2023-01-06 MED ORDER — PROMETHAZINE HCL 25 MG/ML IJ SOLN: 6.2500 mg | INTRAMUSCULAR | Status: DC | PRN

## 2023-01-06 MED ORDER — CHLORHEXIDINE GLUCONATE 0.12 % MT SOLN: 15.0000 mL | Freq: Once | OROMUCOSAL | Status: AC

## 2023-01-06 MED ORDER — ORAL CARE MOUTH RINSE: 15.0000 mL | Freq: Once | OROMUCOSAL | Status: AC

## 2023-01-06 MED ORDER — CHLORHEXIDINE GLUCONATE CLOTH 2 % EX PADS
6.0000 | MEDICATED_PAD | Freq: Every day | CUTANEOUS | Status: DC
  Administered 2023-01-07: 6 via TOPICAL

## 2023-01-06 MED ORDER — LABETALOL HCL 5 MG/ML IV SOLN: 10.0000 mg | INTRAVENOUS | Status: DC | PRN

## 2023-01-06 MED ORDER — SODIUM CHLORIDE 0.9% FLUSH: 3.0000 mL | INTRAVENOUS | Status: DC | PRN

## 2023-01-06 MED ORDER — PHENYLEPHRINE HCL-NACL 20-0.9 MG/250ML-% IV SOLN
INTRAVENOUS | Status: DC | PRN
  Administered 2023-01-06: 15 ug/min via INTRAVENOUS

## 2023-01-06 MED ORDER — 0.9 % SODIUM CHLORIDE (POUR BTL) OPTIME
TOPICAL | Status: DC | PRN
  Administered 2023-01-06: 1000 mL

## 2023-01-06 MED ORDER — OXYCODONE HCL 5 MG PO TABS: 5.0000 mg | ORAL_TABLET | ORAL | Status: DC | PRN

## 2023-01-06 MED ORDER — BUPIVACAINE-MELOXICAM ER 400-12 MG/14ML IJ SOLN
INTRAMUSCULAR | Status: DC | PRN
  Administered 2023-01-06: 400 mg

## 2023-01-06 MED ORDER — METHOCARBAMOL 1000 MG/10ML IJ SOLN: 500.0000 mg | Freq: Four times a day (QID) | INTRAVENOUS | Status: DC | PRN

## 2023-01-06 MED ORDER — FENTANYL CITRATE PF 50 MCG/ML IJ SOSY
50.0000 ug | PREFILLED_SYRINGE | INTRAMUSCULAR | Status: AC | PRN
  Administered 2023-01-06 (×2): 50 ug via INTRAVENOUS
  Filled 2023-01-06 (×2): qty 1

## 2023-01-06 MED ORDER — HEPARIN SODIUM (PORCINE) 5000 UNIT/ML IJ SOLN: 5000.0000 [IU] | Freq: Three times a day (TID) | INTRAMUSCULAR | Status: DC

## 2023-01-06 MED ORDER — TRANEXAMIC ACID-NACL 1000-0.7 MG/100ML-% IV SOLN
1000.0000 mg | INTRAVENOUS | Status: AC
  Administered 2023-01-06: 1000 mg via INTRAVENOUS
  Filled 2023-01-06: qty 100

## 2023-01-06 MED ORDER — ENOXAPARIN SODIUM 40 MG/0.4ML IJ SOSY
40.0000 mg | PREFILLED_SYRINGE | INTRAMUSCULAR | Status: DC
  Administered 2023-01-07 – 2023-01-12 (×6): 40 mg via SUBCUTANEOUS
  Filled 2023-01-06 (×6): qty 0.4

## 2023-01-06 MED ORDER — ALUM & MAG HYDROXIDE-SIMETH 200-200-20 MG/5ML PO SUSP: 30.0000 mL | ORAL | Status: DC | PRN

## 2023-01-06 MED ORDER — CHLORHEXIDINE GLUCONATE 4 % EX LIQD
60.0000 mL | Freq: Once | CUTANEOUS | Status: AC
  Administered 2023-01-06: 4 via TOPICAL

## 2023-01-06 MED ORDER — ACETAMINOPHEN 325 MG PO TABS: 650.0000 mg | ORAL_TABLET | Freq: Four times a day (QID) | ORAL | Status: DC | PRN

## 2023-01-06 MED ORDER — METHOCARBAMOL 500 MG PO TABS
500.0000 mg | ORAL_TABLET | Freq: Three times a day (TID) | ORAL | Status: DC
  Administered 2023-01-06 – 2023-01-13 (×19): 500 mg via ORAL
  Filled 2023-01-06 (×21): qty 1

## 2023-01-06 MED ORDER — ACETAMINOPHEN 325 MG PO TABS
325.0000 mg | ORAL_TABLET | Freq: Four times a day (QID) | ORAL | Status: DC | PRN
  Administered 2023-01-06: 325 mg via ORAL
  Administered 2023-01-08 – 2023-01-10 (×3): 650 mg via ORAL
  Filled 2023-01-06 (×4): qty 2

## 2023-01-06 MED ORDER — PHENOL 1.4 % MT LIQD: 1.0000 | OROMUCOSAL | Status: DC | PRN

## 2023-01-06 MED ORDER — VANCOMYCIN HCL IN DEXTROSE 1-5 GM/200ML-% IV SOLN
1000.0000 mg | Freq: Once | INTRAVENOUS | Status: AC
  Administered 2023-01-07: 1000 mg via INTRAVENOUS
  Filled 2023-01-06: qty 200

## 2023-01-06 MED ORDER — ACETAMINOPHEN 650 MG RE SUPP: 650.0000 mg | Freq: Four times a day (QID) | RECTAL | Status: DC | PRN

## 2023-01-06 SURGICAL SUPPLY — 70 items
ADH SKN CLS APL DERMABOND .7 (GAUZE/BANDAGES/DRESSINGS) ×1
BAG COUNTER SPONGE SURGICOUNT (BAG) ×1 IMPLANT
BAG DECANTER FOR FLEXI CONT (MISCELLANEOUS) ×1 IMPLANT
BAG SPNG CNTER NS LX DISP (BAG) ×1
BALL HIP ARTICU 28 +5 (Hips) IMPLANT
BIPOLAR PROS AML 45 (Hips) ×1 IMPLANT
BLADE SAG 18X100X1.27 (BLADE) ×1 IMPLANT
CLSR STERI-STRIP ANTIMIC 1/2X4 (GAUZE/BANDAGES/DRESSINGS) IMPLANT
COOLER ICEMAN CLASSIC (MISCELLANEOUS) IMPLANT
COVER PERINEAL POST (MISCELLANEOUS) ×1 IMPLANT
COVER SURGICAL LIGHT HANDLE (MISCELLANEOUS) ×1 IMPLANT
DERMABOND ADVANCED .7 DNX12 (GAUZE/BANDAGES/DRESSINGS) IMPLANT
DRAPE C-ARM 42X72 X-RAY (DRAPES) ×1 IMPLANT
DRAPE POUCH INSTRU U-SHP 10X18 (DRAPES) ×1 IMPLANT
DRAPE STERI IOBAN 125X83 (DRAPES) ×1 IMPLANT
DRAPE U-SHAPE 47X51 STRL (DRAPES) ×2 IMPLANT
DRSG AQUACEL AG ADV 3.5X 6 (GAUZE/BANDAGES/DRESSINGS) IMPLANT
DRSG AQUACEL AG ADV 3.5X10 (GAUZE/BANDAGES/DRESSINGS) ×1 IMPLANT
DURAPREP 26ML APPLICATOR (WOUND CARE) ×2 IMPLANT
ELECT BLADE 4.0 EZ CLEAN MEGAD (MISCELLANEOUS) ×1
ELECT REM PT RETURN 9FT ADLT (ELECTROSURGICAL) ×1
ELECTRODE BLDE 4.0 EZ CLN MEGD (MISCELLANEOUS) ×1 IMPLANT
ELECTRODE REM PT RTRN 9FT ADLT (ELECTROSURGICAL) ×1 IMPLANT
GLOVE BIOGEL PI IND STRL 7.0 (GLOVE) ×2 IMPLANT
GLOVE BIOGEL PI IND STRL 7.5 (GLOVE) ×5 IMPLANT
GLOVE ECLIPSE 7.0 STRL STRAW (GLOVE) ×2 IMPLANT
GLOVE SKINSENSE STRL SZ7.5 (GLOVE) ×1 IMPLANT
GLOVE SURG SYN 7.5  E (GLOVE) ×2
GLOVE SURG SYN 7.5 E (GLOVE) ×2 IMPLANT
GLOVE SURG SYN 7.5 PF PI (GLOVE) ×2 IMPLANT
GLOVE SURG UNDER POLY LF SZ7 (GLOVE) ×3 IMPLANT
GLOVE SURG UNDER POLY LF SZ7.5 (GLOVE) ×2 IMPLANT
GOWN STRL REUS W/ TWL LRG LVL3 (GOWN DISPOSABLE) IMPLANT
GOWN STRL REUS W/ TWL XL LVL3 (GOWN DISPOSABLE) ×1 IMPLANT
GOWN STRL REUS W/TWL LRG LVL3 (GOWN DISPOSABLE)
GOWN STRL REUS W/TWL XL LVL3 (GOWN DISPOSABLE) ×1
GOWN STRL SURGICAL XL XLNG (GOWN DISPOSABLE) ×1 IMPLANT
GOWN TOGA ZIPPER T7+ PEEL AWAY (MISCELLANEOUS) ×2 IMPLANT
HANDPIECE INTERPULSE COAX TIP (DISPOSABLE) ×1
HEAD BIPOLAR PROS AML 45 (Hips) IMPLANT
HIP BALL ARTICU 28 +5 (Hips) ×1 IMPLANT
HOOD PEEL AWAY T7 (MISCELLANEOUS) ×1 IMPLANT
IV NS IRRIG 3000ML ARTHROMATIC (IV SOLUTION) ×1 IMPLANT
KIT BASIN OR (CUSTOM PROCEDURE TRAY) ×1 IMPLANT
MARKER SKIN DUAL TIP RULER LAB (MISCELLANEOUS) ×1 IMPLANT
NDL SPNL 18GX3.5 QUINCKE PK (NEEDLE) ×1 IMPLANT
NEEDLE SPNL 18GX3.5 QUINCKE PK (NEEDLE) ×1 IMPLANT
PACK TOTAL JOINT (CUSTOM PROCEDURE TRAY) ×1 IMPLANT
PACK UNIVERSAL I (CUSTOM PROCEDURE TRAY) ×1 IMPLANT
PAD COLD SHLDR WRAP-ON (PAD) IMPLANT
SET HNDPC FAN SPRY TIP SCT (DISPOSABLE) ×1 IMPLANT
SOLUTION PRONTOSAN WOUND 350ML (IRRIGATION / IRRIGATOR) ×1 IMPLANT
STAPLER VISISTAT 35W (STAPLE) IMPLANT
STEM SUMMIT BASIC PRESSFIT SZ5 (Hips) IMPLANT
SUT ETHIBOND 2 V 37 (SUTURE) ×1 IMPLANT
SUT ETHILON 2 0 FS 18 (SUTURE) IMPLANT
SUT VIC AB 0 CT1 27 (SUTURE) ×1
SUT VIC AB 0 CT1 27XBRD ANBCTR (SUTURE) ×1 IMPLANT
SUT VIC AB 1 CTX 36 (SUTURE) ×1
SUT VIC AB 1 CTX36XBRD ANBCTR (SUTURE) ×1 IMPLANT
SUT VIC AB 2-0 CT1 27 (SUTURE) ×2
SUT VIC AB 2-0 CT1 TAPERPNT 27 (SUTURE) ×2 IMPLANT
SYR 50ML LL SCALE MARK (SYRINGE) ×1 IMPLANT
TOWEL GREEN STERILE (TOWEL DISPOSABLE) ×1 IMPLANT
TRAY CATH INTERMITTENT SS 16FR (CATHETERS) IMPLANT
TRAY FOLEY W/BAG SLVR 14FR (SET/KITS/TRAYS/PACK) IMPLANT
TRAY FOLEY W/BAG SLVR 16FR (SET/KITS/TRAYS/PACK)
TRAY FOLEY W/BAG SLVR 16FR ST (SET/KITS/TRAYS/PACK) IMPLANT
TUBE SUCT ARGYLE STRL (TUBING) ×1 IMPLANT
YANKAUER SUCT BULB TIP NO VENT (SUCTIONS) ×1 IMPLANT

## 2023-01-06 NOTE — Transfer of Care (Signed)
Immediate Anesthesia Transfer of Care Note  Patient: Candice Ray  Procedure(s) Performed: LEFT HIP HEMIARTHROPLASTY (Left: Hip)  Patient Location: PACU  Anesthesia Type:Spinal  Level of Consciousness: confused  Airway & Oxygen Therapy: Patient Spontanous Breathing  Post-op Assessment: Report given to RN and Post -op Vital signs reviewed and stable  Post vital signs: Reviewed and stable  Last Vitals:  Vitals Value Taken Time  BP 111/72 01/06/23 1904  Temp    Pulse 117 01/06/23 1905  Resp 22 01/06/23 1905  SpO2 98 % 01/06/23 1905  Vitals shown include unvalidated device data.  Last Pain:  Vitals:   01/06/23 1700  TempSrc: Oral  PainSc:          Complications: No notable events documented.

## 2023-01-06 NOTE — Anesthesia Postprocedure Evaluation (Signed)
Anesthesia Post Note  Patient: Candice Ray  Procedure(s) Performed: LEFT HIP HEMIARTHROPLASTY (Left: Hip)     Patient location during evaluation: PACU Anesthesia Type: MAC and Spinal Level of consciousness: sedated and patient cooperative Pain management: pain level controlled Vital Signs Assessment: post-procedure vital signs reviewed and stable Respiratory status: spontaneous breathing Cardiovascular status: stable Anesthetic complications: no   No notable events documented.  Last Vitals:  Vitals:   01/06/23 1915 01/06/23 1930  BP: 129/70 129/70  Pulse: (!) 109   Resp: (!) 26   Temp:    SpO2: 97%     Last Pain:  Vitals:   01/06/23 1700  TempSrc: Oral  PainSc:                  Nolon Nations

## 2023-01-06 NOTE — Op Note (Signed)
LEFT HIP HEMIARTHROPLASTY  Procedure Note Candice Ray   [id number]  Pre-op Diagnosis: Left displaced femoral neck fracture     Post-op Diagnosis: same   Operative Procedures  1. Prosthetic replacement for femoral neck fracture. CPT [id number]  Operative Findings 1. Acute femoral neck fracture  Personnel  Surgeon(s): Leandrew Koyanagi, MD  ASSIST: Madalyn Rob, PA-C   Anesthesia: spinal  Prosthesis: Depuy Femur: Summit Basic 5 Head: 45 mm size: +5 Bearing Type: bipolar  Hip Hemiarthroplasty (Anterior Approach) Op Note:  After informed consent was obtained and the operative extremity marked in the holding area, the patient was brought back to the operating room and placed supine on the HANA table. Next, the operative extremity was prepped and draped in normal sterile fashion. Surgical timeout occurred verifying patient identification, surgical site, surgical procedure and administration of antibiotics.  A Hueter approach to the hip was performed, using the interval between tensor fascia lata and sartorius.  Dissection was carried bluntly down onto the anterior hip capsule. The lateral femoral circumflex vessels were identified and coagulated. A capsulotomy was performed and fracture hematoma was evacuated and the capsular flaps tagged for later repair.  The neck osteotomy was performed below the fracture. The femoral head was removed and found a 45 mm head was the appropriate fit.    We then turned our attention to the femur.  After placing the femoral hook, the leg was taken to externally rotated, extended and adducted position taking care to perform soft tissue releases to allow for adequate mobilization of the femur. Soft tissue was cleared from the shoulder of the greater trochanter and the hook elevator used to improve exposure of the proximal femur. Sequential broaching performed up to a size 5. Trial neck and head were placed. The leg was brought back up to neutral and the  construct reduced. The position and sizing of components, offset and leg lengths were checked using fluoroscopy. Stability of the construct was checked in extension and external rotation without any subluxation or impingement of prosthesis. We dislocated the prosthesis, dropped the leg back into position, removed trial components, and irrigated copiously. The final stem and head was then placed, the leg brought back up, the system reduced and fluoroscopy used to verify positioning.  We irrigated, obtained hemostasis and closed the capsule using #2 ethibond suture.  The fascia was closed with #1 vicryl plus, the deep fat layer was closed with 0 vicryl, the subcutaneous layers closed with 2.0 Vicryl Plus and the skin closed with 2.0 nylon. A sterile dressing was applied. The patient was awakened in the operating room and taken to recovery in stable condition. All sponge, needle, and instrument counts were correct at the end of the case.   Tawanna Cooler, my PA, was necessary for opening, closing, exposing, retracting, limb positioning and overall facilitation and completion of the surgery.  Position: supine  Complications: see description of procedure.  Time Out: performed   Drains/Packing: none  Estimated blood loss: see anesthesia record  Returned to Recovery Room: in good condition.   Antibiotics: yes   Mechanical VTE (DVT) Prophylaxis: sequential compression devices, TED thigh-high  Chemical VTE (DVT) Prophylaxis: lovenox POD 1  Fluid Replacement: Crystalloid: see anesthesia record  Specimens Removed: 1 to pathology   Sponge and Instrument Count Correct? yes   PACU: portable radiograph - low AP   Admission: inpatient status, start PT & OT POD#1  Plan/RTC: Return in 2 weeks for staple removal. Return in 6 weeks to  see MD.  Weight Bearing/Load Lower Extremity: full  Hip precautions: none  N. Eduard Roux, MD Marga Hoots 6:36 PM   Implant Name Type Inv. Item Serial No.  Manufacturer Lot No. LRB No. Used Action  HIP BALL ARTICU 28 +5 - [id number] Hips HIP BALL ARTICU 28 +5  [employer] [id number] Left 1 Implanted  BIPOLAR PROS AML 45 - [id number] Hips BIPOLAR PROS AML 45  [employer]PU[employer]TH[employer] [id number] Left 1 Implanted  STEM SUMMIT BASIC PRESSFIT SZ5 - [id number] Hips STEM SUMMIT BASIC PRESSFIT SZ5  [employer]TH[employer] [id number] Left 1 Implanted

## 2023-01-06 NOTE — ED Triage Notes (Signed)
Pt bib family with reports of L hip/leg pain onset this morning when pt woke up. Pt unable to actively move LLE. Family denies fall/injury.

## 2023-01-06 NOTE — H&P (Addendum)
Patient Demographics:    Candice Ray, is a 89 y.o. female  MRN: [id number]   DOB - Mar 03, 1932  Admit Date - 01/06/2023  Outpatient Primary MD for the patient is [facility]:   Assessment and Plan:  1)Lt Hip Fx- -Left hip x-ray shows subcapital left femoral neck fracture with superolateral displacement of the distal femur -CBC WNL -INR is 1.1 -EKG sinus tachycardia without acute changes -EDP discussed case with on-call orthopedic surgeon Dr. Erlinda Hong who recommends transfer to [facility] for Lt Hip Hemiarthroplasty  --IV fentanyl and p.o. oxycodone and methocarbamol as prescribed for pain control - 2)Dementia/Schizophrenia--- high risk for hospital delirium/psychosis --- Okay to use lorazepam as needed restlessness or agitation -Trazodone nightly as needed   3)DM2-recent A1c is 8.9 reflecting uncontrolled diabetes with hyperglycemia PTA  -Hold SYNJARDY- -Use Novolog/Humalog Sliding scale insulin with Accu-Cheks/Fingersticks as ordered    4) social/ethics--- Patient is a poor historian due to advanced dementia and significant hearing loss -Most of the history is obtained from patient's daughter-in-law Morganna Fujitani who is at bedside and patient's son Saman Yazel who is on the phone on speaker --Patient is a DNR/DNI,  No limitations to treatment otherwise  5)Dysphagia--- continue dysphagia 3 diet with thin liquids.    6) elevated BP--suspect related to pain associated with left hip fracture -At baseline patient usually does not have significant hypertension -Okay to use IV labetalol as needed elevated BP  Status is: Inpatient  Remains inpatient appropriate because:   Dispo: The patient is from: Home              Anticipated d/c is to: SNF              Anticipated d/c date is:  2 days              Patient currently is not medically stable to d/c. Barriers: Not Clinically Stable-    With History of - Reviewed by me  Past Medical History:  Diagnosis Date   Diabetes mellitus    HOH (hard of hearing)    Poor historian    Sinus trouble       Past Surgical History:  Procedure Laterality Date   COLONOSCOPY  2007   Dr. Gala Romney: tubular adenomas/rectal   COLONOSCOPY N/A 01/06/2014   [id number] diverticulosis. Colonic polyp-removed as described above. Status post segmental biopsy and stool collection for culture   HERNIA REPAIR     umbilical, [facility]   HIP ARTHROPLASTY Right 04/22/2022   Procedure: ARTHROPLASTY BIPOLAR HIP (HEMIARTHROPLASTY);  Surgeon: Altamese Falling [place], MD;  Location: [facility];  Service: Orthopedics;  Laterality: Right;   HYSTEROSCOPY WITH D & C  05/23/2012   Procedure: DILATATION AND CURETTAGE /HYSTEROSCOPY;  Surgeon: Florian Buff, MD;  Location: [facility];  Service: Gynecology;  Laterality: N/A;   POLYPECTOMY  05/23/2012   Procedure: POLYPECTOMY;  Surgeon: Florian Buff, MD;  Location: [facility];  Service:  Gynecology;  Laterality: N/A;  endometrial polypectomy    Chief Complaint  Patient presents with   Leg Pain   Hip Pain      HPI:    Candice Ray  is a 89 y.o. female with past medical history relevant for significant hearing loss, advanced dementia, DM2 , history of schizophrenia/psychosis and history of recurrent falls who presents to the ED with sudden onset of left hip pain on 01/06/2023 - Patient is a poor historian due to advanced dementia and significant hearing loss - Most of the history is obtained from patient's daughter-in-law Elvira Copps who is at bedside and patient's son Chelli Vanzee who is on the phone on speaker - No fall or specific injury recalled -Patient had difficulty ambulating this morning and complained of left hip pain -  No Nausea, Vomiting or Diarrhea No fever  Or chills  No chest pains, dizziness, palpitations,  shortness of breath or dyspnea on exertion  -Left hip x-ray shows subcapital left femoral neck fracture with superolateral displacement of the distal femur -CBC WNL -BMP shows a sodium of 132, creatinine is 1.0, glucose is 258 potassium is 4.3 bicarb is 26, anion gap is 8 -INR is 1.1 -EKG sinus tachycardia without acute changes  -EDP discussed case with on-call orthopedic surgeon Dr. Erlinda Hong who recommends transfer to [facility] for Lt Hip Hemiarthroplasty  -   Review of systems:    In addition to the HPI above,   A full Review of  Systems was done, all other systems reviewed are negative except as noted above in HPI , .    Social History:  Reviewed by me    Social History   Tobacco Use   Smoking status: Never   Smokeless tobacco: Never  Substance Use Topics   Alcohol use: No     Family History :  Reviewed by me    Family History  Problem Relation Age of Onset   Colon cancer Neg Hx     Home Medications:   Prior to Admission medications   Medication Sig Start Date End Date Taking? Authorizing Provider  acetaminophen (TYLENOL) 500 MG tablet Take 1 tablet (500 mg total) by mouth every 6 (six) hours as needed for mild pain or moderate pain. 04/26/22  Yes Ainsley Spinner, PA-C  aspirin EC 325 MG tablet Take 325 mg by mouth daily.   Yes [provider]  CALCIUM PO Take 1 tablet by mouth daily.   Yes [provider]  Empagliflozin-metFORMIN HCl (SYNJARDY) 12.02-999 MG TABS Take 1 tablet by mouth 2 (two) times daily. 06/02/22  Yes Gerlene Fee, NP  JANUVIA 25 MG tablet Take 25 mg by mouth daily. 12/12/22  Yes [provider]  polyethylene glycol (MIRALAX / GLYCOLAX) 17 g packet Take 17 g by mouth daily as needed for moderate constipation or severe constipation. 04/26/22  Yes Pokhrel, Laxman, MD  docusate sodium (COLACE) 100 MG capsule Take 1 capsule (100 mg total) by mouth 2 (two) times daily as needed for mild constipation or moderate constipation. Patient  not taking: Reported on 01/06/2023 04/26/22   Pokhrel, Corrie Mckusick, MD  NON FORMULARY Diet: Dysphagia 3 (puree) diet with thin liquids    [provider]  ondansetron (ZOFRAN) 4 MG tablet Take 1 tablet (4 mg total) by mouth every 6 (six) hours as needed for nausea. Patient not taking: Reported on 01/06/2023 06/02/22   Gerlene Fee, NP     Allergies:     Allergies  Allergen Reactions  Contrast Media [Iodinated Contrast Media] Swelling   Metrizamide Swelling     Physical Exam:   Vitals  Blood pressure (!) 166/99, pulse (!) 104, temperature 98.2 F (36.8 C), temperature source Oral, resp. rate 18, SpO2 92 %.  Physical Examination: General appearance - alert,  in no distress  Mental status - alert, advanced cognitive and memory deficits due to underlying dementia Eyes - sclera anicteric EArs--Very HOH Neck - supple, no JVD elevation , Chest - clear  to auscultation bilaterally, symmetrical air movement,  Heart - S1 and S2 normal, regular  Abdomen - soft, nontender, nondistended, +BS Neurological - neck supple without rigidity, cranial nerves II through XII intact, DTR's normal and symmetric Extremities - no pedal edema noted, intact peripheral pulses , left lower extremity shortened and rotated, point tenderness over the lateral hip area, no obvious bruising/ecchymosis Skin - warm, dry     Data Review:    CBC Recent Labs  Lab 01/06/23 1027  WBC 5.2  HGB 13.3  HCT 40.9  PLT 204  MCV 88.1  MCH 28.7  MCHC 32.5  RDW 13.6  LYMPHSABS 0.9  MONOABS 0.5  EOSABS 0.1  BASOSABS 0.0   ------------------------------------------------------------------------------------------------------------------  Chemistries  Recent Labs  Lab 01/06/23 1027  NA 132*  K 4.3  CL 98  CO2 26  GLUCOSE 258*  BUN 28*  CREATININE 1.01*  CALCIUM 8.9   ------------------------------------------------------------------------------------------------------------------ Coagulation  profile Recent Labs  Lab 01/06/23 1027  INR 1.1    Urinalysis    Component Value Date/Time   COLORURINE YELLOW 12/30/2021 0150   APPEARANCEUR CLEAR 12/30/2021 0150   LABSPEC 1.024 12/30/2021 0150   PHURINE 5.0 12/30/2021 0150   GLUCOSEU >=500 (A) 12/30/2021 0150   HGBUR MODERATE (A) 12/30/2021 0150   BILIRUBINUR NEGATIVE 12/30/2021 0150   KETONESUR 80 (A) 12/30/2021 0150   PROTEINUR 30 (A) 12/30/2021 0150   NITRITE NEGATIVE 12/30/2021 0150   LEUKOCYTESUR SMALL (A) 12/30/2021 0150    ----------------------------------------------------------------------------------------------------------------   Imaging Results:    DG HIP UNILAT WITH PELVIS 2-3 VIEWS LEFT  Result Date: 01/06/2023 CLINICAL DATA:  Acute left hip pain. Patient brought in by family with report the left hip pain when patient woke up. EXAM: DG HIP (WITH OR WITHOUT PELVIS) 2-3V LEFT COMPARISON:  Hip radiographs dated April 22, 2022 FINDINGS: There is subcapital left femoral neck fracture with superolateral displacement of the distal femur status post right hip arthroplasty with intact hardware. Osteopenia and degenerate disc disease of the lower lumbar spine. IMPRESSION: Subcapital left femoral neck fracture with superolateral displacement of the distal femur. Electronically Signed   By: Keane Police D.O.   On: 01/06/2023 11:51    Radiological Exams on Admission: DG HIP UNILAT WITH PELVIS 2-3 VIEWS LEFT  Result Date: 01/06/2023 CLINICAL DATA:  Acute left hip pain. Patient brought in by family with report the left hip pain when patient woke up. EXAM: DG HIP (WITH OR WITHOUT PELVIS) 2-3V LEFT COMPARISON:  Hip radiographs dated April 22, 2022 FINDINGS: There is subcapital left femoral neck fracture with superolateral displacement of the distal femur status post right hip arthroplasty with intact hardware. Osteopenia and degenerate disc disease of the lower lumbar spine. IMPRESSION: Subcapital left femoral neck fracture with  superolateral displacement of the distal femur. Electronically Signed   By: Keane Police D.O.   On: 01/06/2023 11:51    DVT Prophylaxis -SCD/Heparin AM Labs Ordered, also please review Full Orders  Family Communication: Admission, patients condition and plan of care including  tests being ordered have been discussed with the patient and son and daughter in law who indicate understanding and agree with the plan   Condition   -stable  Roxan Hockey M.D on 01/06/2023 at 5:33 PM Go to [email or web address] -  for contact info  [facility] - Office  [phone]

## 2023-01-06 NOTE — ED Provider Notes (Signed)
[facility] Provider Note   CSN: [id number] Arrival date & time: 01/06/23  [id number]     History  Chief Complaint  Patient presents with   Leg Pain   Hip Pain    Candice Ray is a 89 y.o. female.  HPI    89 year old female comes in with chief complaint of leg pain.  Patient normally ambulatory, lives independently.  This morning patient's daughter-in-law went to check on her.  In the evening patient was doing fine.  This morning she was complaining of pain in her leg.  She actually did walk to the bathroom and got a shower but continued to complain of worsening leg pain, therefore they brought her to the ER.  There was no trauma noted, but patient might have gotten up to go to the bathroom in the middle the night.  Home Medications Prior to Admission medications   Medication Sig Start Date End Date Taking? Authorizing Provider  acetaminophen (TYLENOL) 500 MG tablet Take 1 tablet (500 mg total) by mouth every 6 (six) hours as needed for mild pain or moderate pain. 04/26/22   Ainsley Spinner, PA-C  aspirin EC 81 MG tablet Take 81 mg by mouth daily.    [provider]  Janne Lab Oil [facility]) OINT Apply topically 3 (three) times daily. Apply to left buttock    [provider]  collagenase (SANTYL) 250 UNIT/GM ointment Apply 1 Application topically daily. Apply to wound right buttock per tx orders 06/02/22   Gerlene Fee, NP  docusate sodium (COLACE) 100 MG capsule Take 1 capsule (100 mg total) by mouth 2 (two) times daily as needed for mild constipation or moderate constipation. 04/26/22   Pokhrel, Corrie Mckusick, MD  Empagliflozin-metFORMIN HCl (SYNJARDY) 12.02-999 MG TABS Take 1 tablet by mouth 2 (two) times daily. 06/02/22   Gerlene Fee, NP  NON FORMULARY Diet: Dysphagia 3 (puree) diet with thin liquids    [provider]  Nutritional Supplements (ENSURE MAX PROTEIN PO) Take by mouth daily.    [provider]  ondansetron (ZOFRAN) 4 MG tablet Take 1 tablet (4 mg total) by mouth every 6 (six) hours as needed for nausea. 06/02/22   Gerlene Fee, NP  polyethylene glycol (MIRALAX / GLYCOLAX) 17 g packet Take 17 g by mouth daily as needed for moderate constipation or severe constipation. 04/26/22   Pokhrel, Corrie Mckusick, MD  UNABLE TO FIND Take 1 Canister by mouth daily at 12 noon. Magic cup    [provider]      Allergies    Contrast media [iodinated contrast media] and Metrizamide    Review of Systems   Review of Systems  All other systems reviewed and are negative.   Physical Exam Updated Vital Signs BP 130/73   Pulse (!) 104   Temp (!) 97.4 F (36.3 C)   Resp 20   SpO2 93%  Physical Exam Vitals and nursing note reviewed.  Constitutional:      Appearance: She is well-developed.  HENT:     Head: Atraumatic.  Cardiovascular:     Rate and Rhythm: Normal rate.  Pulmonary:     Effort: Pulmonary effort is normal.  Musculoskeletal:        General: Swelling and tenderness present.     Cervical back: Normal range of motion and neck supple.     Comments: Patient has tenderness over the left hip, proximal femur.  Skin:    General: Skin is  warm and dry.  Neurological:     Mental Status: She is alert and oriented to person, place, and time.     ED Results / Procedures / Treatments   Labs (all labs ordered are listed, but only abnormal results are displayed) Labs Reviewed  BASIC METABOLIC PANEL - Abnormal; Notable for the following components:      Result Value   Sodium 132 (*)    Glucose, Bld 258 (*)    BUN 28 (*)    Creatinine, Ser 1.01 (*)    GFR, Estimated 53 (*)    All other components within normal limits  CBC WITH DIFFERENTIAL/PLATELET  PROTIME-INR  HEMOGLOBIN A1C    EKG EKG Interpretation  Date/Time:  Friday January 06 2023 10:45:55 EST Ventricular Rate:  96 PR Interval:  181 QRS Duration: 79 QT Interval:  381 QTC Calculation: 482 R Axis:   -59 Text  Interpretation: Sinus rhythm Atrial premature complex Left anterior fascicular block Low voltage, precordial leads Consider anterior infarct Minimal ST elevation, lateral leads No acute changes No significant change since last tracing Confirmed by Varney Biles [phone]) on 01/06/2023 11:01:47 AM  Radiology DG HIP UNILAT WITH PELVIS 2-3 VIEWS LEFT  Result Date: 01/06/2023 CLINICAL DATA:  Acute left hip pain. Patient brought in by family with report the left hip pain when patient woke up. EXAM: DG HIP (WITH OR WITHOUT PELVIS) 2-3V LEFT COMPARISON:  Hip radiographs dated April 22, 2022 FINDINGS: There is subcapital left femoral neck fracture with superolateral displacement of the distal femur status post right hip arthroplasty with intact hardware. Osteopenia and degenerate disc disease of the lower lumbar spine. IMPRESSION: Subcapital left femoral neck fracture with superolateral displacement of the distal femur. Electronically Signed   By: Keane Police D.O.   On: 01/06/2023 11:51    Procedures Procedures    Medications Ordered in ED Medications  lactated ringers infusion ( Intravenous New Bag/Given 01/06/23 1028)  fentaNYL (SUBLIMAZE) injection 50 mcg (50 mcg Intravenous Given 01/06/23 1029)  insulin aspart (novoLOG) injection 0-5 Units (has no administration in time range)  insulin aspart (novoLOG) injection 0-6 Units (has no administration in time range)    ED Course/ Medical Decision Making/ A&P                             Medical Decision Making Amount and/or Complexity of Data Reviewed Labs: ordered. Radiology: ordered.  Risk Prescription drug management. Decision regarding hospitalization.   This patient presents to the ED with chief complaint(s) of left leg pain with pertinent past medical history of diabetes, independent living and ability to ambulate without significant assistance.The complaint involves an extensive differential diagnosis and also carries with it a high risk of  complications and morbidity.    The differential diagnosis includes -pathologic fracture of the femur, left hip fracture, stroke.  Given that she is having pain, likely this is musculoskeletal issue.  The initial plan is to x-ray of the hip, basic labs.   Additional history obtained: Additional history obtained from family, patient's daughter-in-law who provides functional status history.  Independent labs interpretation:  The following labs were independently interpreted: Patient's CBC, BMP are overall reassuring.  She has mild hyponatremia.  Independent visualization and interpretation of imaging: - I independently visualized the following imaging with scope of interpretation limited to determining acute life threatening conditions related to emergency care: X-ray of the hip, which revealed femoral fracture  Treatment and Reassessment: Patient reassessed.  Results of the ER workup discussed with her.  She is made aware that she will need likely surgical care.  Consultation: - Consulted or discussed management/test interpretation with external professional: Dr. Erlinda Hong, orthopedics.  He recommends that patient be admitted to [facility].  They can operate on her today if she gets transferred right away.  This recommendation has been discussed with Dr. Joslyn Hy.    Final Clinical Impression(s) / ED Diagnoses Final diagnoses:  Closed fracture of left femur, unspecified fracture morphology, unspecified portion of femur, initial encounter [facility])    Rx / [facility] Orders ED Discharge Orders     None         Varney Biles, MD 01/06/23 1303

## 2023-01-06 NOTE — Anesthesia Procedure Notes (Signed)
Spinal  Patient location during procedure: OR Start time: 01/06/2023 5:24 PM End time: 01/06/2023 5:34 PM Reason for block: surgical anesthesia Staffing Performed: anesthesiologist  Anesthesiologist: Duane Boston, MD Performed by: Duane Boston, MD Authorized by: Duane Boston, MD   Preanesthetic Checklist Completed: patient identified, IV checked, risks and benefits discussed, surgical consent, monitors and equipment checked, pre-op evaluation and timeout performed Spinal Block Patient position: sitting Prep: DuraPrep Patient monitoring: cardiac monitor, continuous pulse ox and blood pressure Approach: midline Location: L2-3 Injection technique: single-shot Needle Needle type: Pencan and Quincke  Needle gauge: 22 G Needle length: 9 cm Assessment Events: CSF return Additional Notes Functioning IV was confirmed and monitors were applied. Sterile prep and drape, including hand hygiene and sterile gloves were used. The patient was positioned and the spine was prepped. The skin was anesthetized with lidocaine.  Free flow of clear CSF was obtained prior to injecting local anesthetic into the CSF.  The spinal needle aspirated freely following injection.  The needle was carefully withdrawn.  The patient tolerated the procedure well.

## 2023-01-06 NOTE — Anesthesia Preprocedure Evaluation (Addendum)
Anesthesia Evaluation  Patient identified by MRN, date of birth, ID band Patient awake    Reviewed: Allergy & Precautions, NPO status , Patient's Chart, lab work & pertinent test results  History of Anesthesia Complications Negative for: history of anesthetic complications  Airway Mallampati: II  TM Distance: >3 FB Neck ROM: Full    Dental  (+) Poor Dentition, Dental Advisory Given   Pulmonary neg pulmonary ROS   Pulmonary exam normal        Cardiovascular Normal cardiovascular exam+ dysrhythmias (PACs, LAFB)      Neuro/Psych  PSYCHIATRIC DISORDERS    Schizophrenia Dementia negative neurological ROS     GI/Hepatic negative GI ROS, Neg liver ROS,,,  Endo/Other  diabetes, Type 2, Oral Hypoglycemic Agents    Renal/GU negative Renal ROS  negative genitourinary   Musculoskeletal Hip fx   Abdominal   Peds  Hematology negative hematology ROS (+)   Anesthesia Other Findings   Reproductive/Obstetrics                             Anesthesia Physical Anesthesia Plan  ASA: 3  Anesthesia Plan: MAC and Spinal   Post-op Pain Management: Toradol IV (intra-op)*   Induction: Intravenous  PONV Risk Score and Plan: 2 and Propofol infusion, Ondansetron and Treatment may vary due to age or medical condition  Airway Management Planned: Simple Face Mask, Natural Airway and Nasal Cannula  Additional Equipment: None  Intra-op Plan:   Post-operative Plan:   Informed Consent: I have reviewed the patients History and Physical, chart, labs and discussed the procedure including the risks, benefits and alternatives for the proposed anesthesia with the patient or authorized representative who has indicated his/her understanding and acceptance.   Patient has DNR.  Discussed DNR with power of attorney and Suspend DNR.   Consent reviewed with POA and Dental advisory given  Plan Discussed with:  Anesthesiologist, CRNA and Surgeon  Anesthesia Plan Comments: (Lab Results      Component                Value               Date                      WBC                      5.2                 01/06/2023                HGB                      13.3                01/06/2023                HCT                      40.9                01/06/2023                MCV                      88.1  01/06/2023                PLT                      204                 01/06/2023           )       Anesthesia Quick Evaluation

## 2023-01-06 NOTE — Consult Note (Signed)
Reason for Consult:Left hip fx Referring Physician: Vernell Leep Time called: [id number] Time at bedside: Candice Ray is an 89 y.o. female.  HPI: Candice Ray fell in the bathroom last night. She was brought to the ED at [facility] where x-rays showed a left hip fx and orthopedic surgery was consulted. She was transferred to [facility] for definitive care. She lives alone.  Past Medical History:  Diagnosis Date   Diabetes mellitus    HOH (hard of hearing)    Poor historian    Sinus trouble     Past Surgical History:  Procedure Laterality Date   COLONOSCOPY  2007   Dr. Gala Romney: tubular adenomas/rectal   COLONOSCOPY N/A 01/06/2014   [id number] diverticulosis. Colonic polyp-removed as described above. Status post segmental biopsy and stool collection for culture   HERNIA REPAIR     umbilical, [facility]   HIP ARTHROPLASTY Right 04/22/2022   Procedure: ARTHROPLASTY BIPOLAR HIP (HEMIARTHROPLASTY);  Surgeon: Altamese Rock Hill, MD;  Location: [place];  Service: Orthopedics;  Laterality: Right;   HYSTEROSCOPY WITH D & C  05/23/2012   Procedure: DILATATION AND CURETTAGE /HYSTEROSCOPY;  Surgeon: Florian Buff, MD;  Location: [facility];  Service: Gynecology;  Laterality: N/A;   POLYPECTOMY  05/23/2012   Procedure: POLYPECTOMY;  Surgeon: Florian Buff, MD;  Location: [facility];  Service: Gynecology;  Laterality: N/A;  endometrial polypectomy    Family History  Problem Relation Age of Onset   Colon cancer Neg Hx     Social History:  reports that she has never smoked. She has never used smokeless tobacco. She reports that she does not drink alcohol and does not use drugs.  Allergies:  Allergies  Allergen Reactions   Contrast Media [Iodinated Contrast Media] Swelling   Metrizamide Swelling    Medications: I have reviewed the patient's current medications.  Results for orders placed or performed during the hospital encounter of 01/06/23 (from the past 48 hour(s))  Basic metabolic panel     Status: Abnormal   Collection  Time: 01/06/23 10:27 AM  Result Value Ref Range   Sodium 132 (L) 135 - 145 mmol/L   Potassium 4.3 3.5 - 5.1 mmol/L   Chloride 98 98 - 111 mmol/L   CO2 26 22 - 32 mmol/L   Glucose, Bld 258 (H) 70 - 99 mg/dL    Comment: Glucose reference range applies only to samples taken after fasting for at least 8 hours.   BUN 28 (H) 8 - 23 mg/dL   Creatinine, Ser 1.01 (H) 0.44 - 1.00 mg/dL   Calcium 8.9 8.9 - 10.3 mg/dL   GFR, Estimated 53 (L) >60 mL/min    Comment: (NOTE) Calculated using the CKD-EPI Creatinine Equation (2021)    Anion gap 8 5 - 15    Comment: Performed at [facility], [address] [place], [place] [address]  CBC with Differential     Status: None   Collection Time: 01/06/23 10:27 AM  Result Value Ref Range   WBC 5.2 4.0 - 10.5 K/uL   RBC 4.64 3.87 - 5.11 MIL/uL   Hemoglobin 13.3 12.0 - 15.0 g/dL   HCT 40.9 36.0 - 46.0 %   MCV 88.1 80.0 - 100.0 fL   MCH 28.7 26.0 - 34.0 pg   MCHC 32.5 30.0 - 36.0 g/dL   RDW 13.6 11.5 - 15.5 %   Platelets 204 150 - 400 K/uL   nRBC 0.0 0.0 - 0.2 %   Neutrophils Relative % 71 %  Neutro Abs 3.8 1.7 - 7.7 K/uL   Lymphocytes Relative 17 %   Lymphs Abs 0.9 0.7 - 4.0 K/uL   Monocytes Relative 9 %   Monocytes Absolute 0.5 0.1 - 1.0 K/uL   Eosinophils Relative 1 %   Eosinophils Absolute 0.1 0.0 - 0.5 K/uL   Basophils Relative 1 %   Basophils Absolute 0.0 0.0 - 0.1 K/uL   Immature Granulocytes 1 %   Abs Immature Granulocytes 0.03 0.00 - 0.07 K/uL    Comment: Performed at [facility], [address] [place], [place] [address]  Protime-INR     Status: None   Collection Time: 01/06/23 10:27 AM  Result Value Ref Range   Prothrombin Time 14.0 11.4 - 15.2 seconds   INR 1.1 0.8 - 1.2    Comment: (NOTE) INR goal varies based on device and disease states. Performed at [facility], [address] [place], [place] [address]   Glucose, capillary     Status: Abnormal   Collection Time: 01/06/23  4:06 PM  Result Value Ref Range    Glucose-Capillary 165 (H) 70 - 99 mg/dL    Comment: Glucose reference range applies only to samples taken after fasting for at least 8 hours.    DG HIP UNILAT WITH PELVIS 2-3 VIEWS LEFT  Result Date: 01/06/2023 CLINICAL DATA:  Acute left hip pain. Patient brought in by family with report the left hip pain when patient woke up. EXAM: DG HIP (WITH OR WITHOUT PELVIS) 2-3V LEFT COMPARISON:  Hip radiographs dated April 22, 2022 FINDINGS: There is subcapital left femoral neck fracture with superolateral displacement of the distal femur status post right hip arthroplasty with intact hardware. Osteopenia and degenerate disc disease of the lower lumbar spine. IMPRESSION: Subcapital left femoral neck fracture with superolateral displacement of the distal femur. Electronically Signed   By: Keane Police D.O.   On: 01/06/2023 11:51    Review of Systems  HENT:  Negative for ear discharge, ear pain, hearing loss and tinnitus.   Eyes:  Negative for photophobia and pain.  Respiratory:  Negative for cough and shortness of breath.   Cardiovascular:  Negative for chest pain.  Gastrointestinal:  Negative for abdominal pain, nausea and vomiting.  Genitourinary:  Negative for dysuria, flank pain, frequency and urgency.  Musculoskeletal:  Positive for arthralgias (Left hip). Negative for back pain, myalgias and neck pain.  Neurological:  Negative for dizziness and headaches.  Hematological:  Does not bruise/bleed easily.  Psychiatric/Behavioral:  The patient is not nervous/anxious.    Blood pressure 112/79, pulse 97, temperature 98.3 F (36.8 C), temperature source Oral, resp. rate 12, SpO2 92 %. Physical Exam Constitutional:      General: She is not in acute distress.    Appearance: She is well-developed. She is not diaphoretic.  HENT:     Head: Normocephalic and atraumatic.  Eyes:     General: No scleral icterus.       Right eye: No discharge.        Left eye: No discharge.     Conjunctiva/sclera:  Conjunctivae normal.  Cardiovascular:     Rate and Rhythm: Normal rate and regular rhythm.  Pulmonary:     Effort: Pulmonary effort is normal. No respiratory distress.  Musculoskeletal:     Cervical back: Normal range of motion.     Comments: LLE No traumatic wounds, ecchymosis, or rash  Mild TTP hip  No knee or ankle effusion  Knee stable to varus/ valgus and anterior/posterior stress  Sens DPN, SPN, TN intact  Motor EHL, ext, flex, evers 5/5  DP 2+, PT 2+, No significant edema  Skin:    General: Skin is warm and dry.  Neurological:     Mental Status: She is alert.  Psychiatric:        Mood and Affect: Mood normal.        Behavior: Behavior normal.     Assessment/Plan: Left hip fx -- Plan hip hemi today with Dr. Erlinda Hong. Please keep NPO.    Lisette Abu, PA-C Orthopedic Surgery [phone] 01/06/2023, 4:15 PM

## 2023-01-06 NOTE — Progress Notes (Signed)
Received report on Patient Dehoff from [place], RN from PACU.  Vitals stable, resting well, resting heatrate in the 100s, denies pain, communication difficult related to patient's hearing deficit, patient is on room air, Ice-man machine noted.  Continue to monitor.  2010 Patient's son and daughter in law are at bedside.  Patient arrived with Hoover Browns, RN.  Alert.  Communication difficult at this time related to hearing deficit.  Patient does not wear hearing aids, per daughter-in-law, hearing is too far gone for hearing aids to be affective.  Skin inspected by Sherron Flemings, RN and [employer], CNA.  Patient able to move all extremities.  Continue to monitor.  2030 Patient is unable to answer Admission questions at this time.

## 2023-01-06 NOTE — Progress Notes (Signed)
Received consult from EDP.  Patient will need hemi.  Timing of surgery will depend on when patient can be [facility].  She's tentatively posted for tomorrow morning but possible surgery today.  Keep NPO and hold anticoagulation of any kind.  Azucena Cecil, MD [facility] 12:47 PM

## 2023-01-06 NOTE — Anesthesia Procedure Notes (Signed)
Procedure Name: MAC Date/Time: 01/06/2023 5:43 PM  Performed by: Dorthea Cove, CRNAPre-anesthesia Checklist: Patient identified, Emergency Drugs available, Suction available, Patient being monitored and Timeout performed Patient Re-evaluated:Patient Re-evaluated prior to induction Oxygen Delivery Method: Simple face mask Preoxygenation: Pre-oxygenation with 100% oxygen Induction Type: IV induction Placement Confirmation: CO2 detector

## 2023-01-06 NOTE — Discharge Instructions (Signed)

## 2023-01-07 ENCOUNTER — Inpatient Hospital Stay (HOSPITAL_COMMUNITY): Payer: Medicare Other

## 2023-01-07 DIAGNOSIS — S72012A Unspecified intracapsular fracture of left femur, initial encounter for closed fracture: Secondary | ICD-10-CM | POA: Diagnosis not present

## 2023-01-07 LAB — RENAL FUNCTION PANEL
Albumin: 3 g/dL — ABNORMAL LOW (ref 3.5–5.0)
Anion gap: 8 (ref 5–15)
BUN: 20 mg/dL (ref 8–23)
CO2: 23 mmol/L (ref 22–32)
Calcium: 8.5 mg/dL — ABNORMAL LOW (ref 8.9–10.3)
Chloride: 100 mmol/L (ref 98–111)
Creatinine, Ser: 1.02 mg/dL — ABNORMAL HIGH (ref 0.44–1.00)
GFR, Estimated: 53 mL/min — ABNORMAL LOW (ref >60)
Glucose, Bld: 163 mg/dL — ABNORMAL HIGH (ref 70–99)
Phosphorus: 3 mg/dL (ref 2.5–4.6)
Potassium: 3.7 mmol/L (ref 3.5–5.1)
Sodium: 131 mmol/L — ABNORMAL LOW (ref 135–145)

## 2023-01-07 LAB — BASIC METABOLIC PANEL
Anion gap: 14 (ref 5–15)
BUN: 20 mg/dL (ref 8–23)
CO2: 20 mmol/L — ABNORMAL LOW (ref 22–32)
Calcium: 8.7 mg/dL — ABNORMAL LOW (ref 8.9–10.3)
Chloride: 98 mmol/L (ref 98–111)
Creatinine, Ser: 1.02 mg/dL — ABNORMAL HIGH (ref 0.44–1.00)
GFR, Estimated: 53 mL/min — ABNORMAL LOW (ref >60)
Glucose, Bld: 203 mg/dL — ABNORMAL HIGH (ref 70–99)
Potassium: 4.6 mmol/L (ref 3.5–5.1)
Sodium: 132 mmol/L — ABNORMAL LOW (ref 135–145)

## 2023-01-07 LAB — GLUCOSE, CAPILLARY
Glucose-Capillary: 195 mg/dL — ABNORMAL HIGH (ref 70–99)
Glucose-Capillary: 178 mg/dL — ABNORMAL HIGH (ref 70–99)
Glucose-Capillary: 150 mg/dL — ABNORMAL HIGH (ref 70–99)
Glucose-Capillary: 167 mg/dL — ABNORMAL HIGH (ref 70–99)

## 2023-01-07 LAB — CBC
HCT: 32.7 % — ABNORMAL LOW (ref 36.0–46.0)
Hemoglobin: 11 g/dL — ABNORMAL LOW (ref 12.0–15.0)
MCH: 29.3 pg (ref 26.0–34.0)
MCHC: 33.6 g/dL (ref 30.0–36.0)
MCV: 87 fL (ref 80.0–100.0)
Platelets: 185 10*3/uL (ref 150–400)
RBC: 3.76 MIL/uL — ABNORMAL LOW (ref 3.87–5.11)
RDW: 13.5 % (ref 11.5–15.5)
WBC: 8.6 10*3/uL (ref 4.0–10.5)
nRBC: 0 % (ref 0.0–0.2)

## 2023-01-07 MED ORDER — HYDROCODONE-ACETAMINOPHEN 5-325 MG PO TABS: 1.0000 | ORAL_TABLET | Freq: Four times a day (QID) | ORAL | 0 refills | Status: DC | PRN

## 2023-01-07 MED ORDER — ENOXAPARIN SODIUM 40 MG/0.4ML IJ SOSY: 40.0000 mg | PREFILLED_SYRINGE | Freq: Every day | INTRAMUSCULAR | 0 refills | Status: DC

## 2023-01-07 NOTE — TOC CAGE-AID Note (Signed)
Transition of Care [facility]) - CAGE-AID Screening   Patient Details  Name: Candice Ray MRN: [id number] Date of Birth: [birth year]  Transition of Care [facility]) CM/SW Contact:    Clovis Cao, RN Phone Number: [phone] 01/07/2023, 4:30 PM   Clinical Narrative: Pt unable to complete screening due to mental status; advanced dementia.   CAGE-AID Screening: Substance Abuse Screening unable to be completed due to: : Patient unable to participate

## 2023-01-07 NOTE — Evaluation (Signed)
Physical Therapy Evaluation Patient Details Name: IRATZE CURLER MRN: [id number] DOB: 1932/10/22 Today's Date: 01/07/2023  History of Present Illness  89 y/o F transferred from [facility] on 3/8 for L hip pain, found to have a L femoral neck fracture. Pt now s/p L hip hemi-arthroplasty on 3/8, WBAT without hip precautions. PMHx: dementia/schizophrenia, DM, hearing loss.  Clinical Impression  Pt presents today functioning below her mobility baseline, with deficits in strength, balance, and cognition. Pt with history of dementia at baseline, limited history obtained and no family available to provide history, but per chart review, pt was living alone and ambulatory, with a history of falls. Pt now s/p L hip hemiarthroplasty, currently requiring min-modAx2 for bed mobility and transfers. Pt without complaints or guarding of LLE in standing, but unabel to ambulate today, even though max encouragement was provided, as pt limited by cognition and hearing. Pt will benefit from skilled acute PT to progress mobility, currently recommending SNF at discharge to maximize independence prior to return home, unless family is able to provide 24/7 care and feels comfortable with pt's current assist level. Acute PT will continue to follow as appropriate.        Recommendations for follow up therapy are one component of a multi-disciplinary discharge planning process, led by the attending physician.  Recommendations may be updated based on patient status, additional functional criteria and insurance authorization.  Follow Up Recommendations Skilled nursing-short term rehab (<3 hours/day) Can patient physically be transported by private vehicle: No    Assistance Recommended at Discharge Frequent or constant Supervision/Assistance  Patient can return home with the following  A lot of help with walking and/or transfers;Assistance with cooking/housework;Direct supervision/assist for medications management;Assist  for transportation;Help with stairs or ramp for entrance    Equipment Recommendations None recommended by PT (defer to next level)  Recommendations for Other Services       Functional Status Assessment Patient has had a recent decline in their functional status and demonstrates the ability to make significant improvements in function in a reasonable and predictable amount of time.     Precautions / Restrictions Precautions Precautions: Fall Restrictions Weight Bearing Restrictions: Yes LLE Weight Bearing: Weight bearing as tolerated      Mobility  Bed Mobility Overal bed mobility: Needs Assistance Bed Mobility: Rolling, Sidelying to Sit, Sit to Supine Rolling: Min assist, +2 for safety/equipment Sidelying to sit: Mod assist, +2 for physical assistance, +2 for safety/equipment   Sit to supine: Mod assist, +2 for physical assistance, +2 for safety/equipment   General bed mobility comments: modA for trunk and BLE management for sidelying>sit and sit>supine    Transfers Overall transfer level: Needs assistance Equipment used: 2 person hand held assist Transfers: Sit to/from Stand Sit to Stand: Min assist, +2 physical assistance, +2 safety/equipment           General transfer comment: standing x2 attempts with minA and 2HHA, attempted to ambulate/transfer but pt limited by cognition and HOH    Ambulation/Gait               General Gait Details: unable as pt limited by cognition/HOH  Stairs            Wheelchair Mobility    Modified Rankin (Stroke Patients Only)       Balance Overall balance assessment: Needs assistance Sitting-balance support: Feet supported Sitting balance-Leahy Scale: Fair Sitting balance - Comments: stable sitting but close guard provided, pt intermittently trying to return to supine due to  cognition   Standing balance support: Bilateral upper extremity supported Standing balance-Leahy Scale: Poor Standing balance comment:  requires assist and reliant on UE support for standing                             Pertinent Vitals/Pain Pain Assessment Pain Assessment: Faces Faces Pain Scale: Hurts a little bit Pain Location: LLE Pain Descriptors / Indicators: Guarding Pain Intervention(s): Limited activity within patient's tolerance, Monitored during session, Repositioned    Home Living Family/patient expects to be discharged to:: Private residence Living Arrangements: Alone Available Help at Discharge: Family Type of Home: House Home Access: Stairs to enter       Home Layout: One level Home Equipment: Conservation officer, nature (2 wheels) Additional Comments: limited history from pt, most of home set-up obtained from chart review, pt reporting STE but unable to state how many    Prior Function Prior Level of Function : Patient poor historian/Family not available             Mobility Comments: Per chart review, pt was independent and living alone but no family available to verify, admission 6/23 pt only ambulating household distances       Hand Dominance   Dominant Hand: Right    Extremity/Trunk Assessment   Upper Extremity Assessment Upper Extremity Assessment: Defer to OT evaluation    Lower Extremity Assessment Lower Extremity Assessment: Generalized weakness (pt with difficulty hearing to follow commands, unable to formally assess but active movement noted in BLE)    Cervical / Trunk Assessment Cervical / Trunk Assessment: Kyphotic  Communication   Communication: HOH (better response with R ear)  Cognition Arousal/Alertness: Awake/alert Behavior During Therapy: WFL for tasks assessed/performed Overall Cognitive Status: History of cognitive impairments - at baseline                                 General Comments: Pt A&Ox4, has history of dementia and requires increased time for processing, pleasant throughout session and follows some commands but limited by hearing         General Comments General comments (skin integrity, edema, etc.): VSS on room air    Exercises     Assessment/Plan    PT Assessment Patient needs continued PT services  PT Problem List Decreased strength;Decreased activity tolerance;Decreased balance;Decreased mobility;Decreased cognition;Decreased knowledge of use of DME;Decreased safety awareness;Decreased knowledge of precautions       PT Treatment Interventions DME instruction;Gait training;Stair training;Functional mobility training;Therapeutic activities;Therapeutic exercise;Balance training;Neuromuscular re-education;Patient/family education    PT Goals (Current goals can be found in the Care Plan section)  Acute Rehab PT Goals Patient Stated Goal: none stated today PT Goal Formulation: Patient unable to participate in goal setting Time For Goal Achievement: 01/21/23 Potential to Achieve Goals: Good    Frequency Min 3X/week     Co-evaluation PT/OT/SLP Co-Evaluation/Treatment: Yes Reason for Co-Treatment: Complexity of the patient's impairments (multi-system involvement);For patient/therapist safety;To address functional/ADL transfers PT goals addressed during session: Mobility/safety with mobility;Balance         AM-PAC PT "6 Clicks" Mobility  Outcome Measure Help needed turning from your back to your side while in a flat bed without using bedrails?: A Lot Help needed moving from lying on your back to sitting on the side of a flat bed without using bedrails?: A Lot Help needed moving to and from a bed to a chair (including a  wheelchair)?: A Lot Help needed standing up from a chair using your arms (e.g., wheelchair or bedside chair)?: A Little Help needed to walk in hospital room?: Total Help needed climbing 3-5 steps with a railing? : Total 6 Click Score: 11    End of Session   Activity Tolerance: Patient tolerated treatment well Patient left: in bed;with call bell/phone within reach;with bed alarm  set;with SCD's reapplied Nurse Communication: Mobility status PT Visit Diagnosis: Muscle weakness (generalized) (M62.81);History of falling (Z91.81);Difficulty in walking, not elsewhere classified (R26.2)    Time: [id number] PT Time Calculation (min) (ACUTE ONLY): 22 min   Charges:   PT Evaluation $PT Eval Moderate Complexity: 1 Mod          Charlynne Cousins, PT DPT Acute Rehabilitation Services Office [phone]   Luvenia Heller 01/07/2023, 3:21 PM

## 2023-01-07 NOTE — Progress Notes (Signed)
   Subjective:  Patient very hard of hearing and pleasantly demented. No events overnight.  Objective:   VITALS:   Vitals:   01/06/23 2340 01/07/23 0000 01/07/23 0321 01/07/23 0814  BP: 134/79 131/73 118/66 116/66  Pulse: 90 (!) 111 (!) 107 (!) 102  Resp: 15 16 18 18   Temp: 99 F (37.2 C) 98.6 F (37 C) 98.8 F (37.1 C) (!) 97.5 F (36.4 C)  TempSrc: Oral Oral Oral Oral  SpO2: 93% 96% 92% 95%    Intact pulses distally Dorsiflexion/Plantar flexion intact Incision: dressing C/D/I and no drainage   Lab Results  Component Value Date   WBC 8.6 01/07/2023   HGB 11.0 (L) 01/07/2023   HCT 32.7 (L) 01/07/2023   MCV 87.0 01/07/2023   PLT 185 01/07/2023     Assessment/Plan:  1 Day Post-Op   Weightbearing: WBAT LLE. Mobilize with PT/OT Insicional and dressing care: Dressings left intact until follow-up Orthopedic device(s):  per PT recommendations Showering: may lightly shower with aquacel dressing VTE prophylaxis: Lovenox 40mg  qd  2 weeks, may begin today Pain control: norco prn Follow - up plan: 2 weeks Contact information:  Erlinda Hong MD, Venida Jarvis PA   Eduard Roux 01/07/2023, 9:42 AM

## 2023-01-07 NOTE — Progress Notes (Signed)
Foley discontinued at this time. Patient tolerated well.

## 2023-01-07 NOTE — Plan of Care (Signed)

## 2023-01-07 NOTE — Progress Notes (Signed)
Patient hasn't had a urine output since foley out.   Bladder scanned volume showed 0. MD aware. Will continue to monitor.

## 2023-01-07 NOTE — Evaluation (Signed)
Occupational Therapy Evaluation Patient Details Name: Candice Ray MRN: [id number] DOB: 02/12/1932 Today's Date: 01/07/2023   History of Present Illness Candice Ray is a 89 yo female who presented with left femoral neck fracture with superolateral displacement of the distal femur aft er fall. Now s/p LEFT HIP HEMIARTHROPLASTY 3/8. PMHx: Dementia/Schizophrenia, DM2-recent A1c is 8.9, HOH   Clinical Impression   Shahina was evaluated s/p the above admission list. Per chart she lives alone but needs assist with ADLs and IADLs at baseline (per 2023 admit), pt presents with impaired cognition and is unable to detail PLOF or home set up. Upon evaluation she was limited by impaired cognition (although oriented 4x), generalized weakness, LLE pain and decreased ROM, unsteady balance and decreased activity tolerance. Overall she required mod A +2 for bed mobility and min A +2 for standing EOB 2x. Due to the deficits listed below, she requires up to max A+ for ADLs. Pt will benefit from continued acute OT services. Recommend d/c to SNF for rehab, unless family can provide direct 24/7 hands on assist and are comfortable assist at the pt's current level.       Recommendations for follow up therapy are one component of a multi-disciplinary discharge planning process, led by the attending physician.  Recommendations may be updated based on patient status, additional functional criteria and insurance authorization.   Follow Up Recommendations  Skilled nursing-short term rehab (<3 hours/day)     Assistance Recommended at Discharge Frequent or constant Supervision/Assistance  Patient can return home with the following A lot of help with walking and/or transfers;A lot of help with bathing/dressing/bathroom;Assistance with cooking/housework;Assistance with feeding;Direct supervision/assist for medications management;Assist for transportation;Direct supervision/assist for financial management;Help with stairs or  ramp for entrance    Functional Status Assessment  Patient has had a recent decline in their functional status and demonstrates the ability to make significant improvements in function in a reasonable and predictable amount of time.  Equipment Recommendations  Other (comment) (defer)       Precautions / Restrictions Precautions Precautions: Fall Restrictions Weight Bearing Restrictions: Yes LLE Weight Bearing: Weight bearing as tolerated      Mobility Bed Mobility Overal bed mobility: Needs Assistance Bed Mobility: Rolling, Sidelying to Sit, Sit to Supine Rolling: Min assist, +2 for safety/equipment Sidelying to sit: Mod assist, +2 for physical assistance, +2 for safety/equipment   Sit to supine: Mod assist, +2 for physical assistance, +2 for safety/equipment        Transfers Overall transfer level: Needs assistance Equipment used: 2 person hand held assist Transfers: Sit to/from Stand Sit to Stand: Min assist, +2 physical assistance, +2 safety/equipment           General transfer comment: attempted to step, pt limited by impaired cognition      Balance Overall balance assessment: Needs assistance Sitting-balance support: Feet supported Sitting balance-Leahy Scale: Fair     Standing balance support: Bilateral upper extremity supported, During functional activity Standing balance-Leahy Scale: Poor                             ADL either performed or assessed with clinical judgement   ADL Overall ADL's : Needs assistance/impaired Eating/Feeding: Minimal assistance;Sitting   Grooming: Minimal assistance;Sitting   Upper Body Bathing: Moderate assistance;Sitting   Lower Body Bathing: Maximal assistance;Sit to/from stand;+2 for safety/equipment   Upper Body Dressing : Minimal assistance;Sitting   Lower Body Dressing: Maximal assistance;+2 for safety/equipment;Sit to/from stand  Toilet Transfer: Maximal assistance;+2 for safety/equipment;Rolling  walker (2 wheels)   Toileting- Clothing Manipulation and Hygiene: Maximal assistance;+2 for safety/equipment;Sit to/from stand       Functional mobility during ADLs: Minimal assistance;+2 for physical assistance;+2 for safety/equipment;Cueing for safety;Cueing for sequencing General ADL Comments: min A +2 to stand at EOB, cues and increased assist needed for cognition and painful LLE     Vision Baseline Vision/History: 0 No visual deficits Vision Assessment?: No apparent visual deficits     Perception Perception Perception Tested?: No   Praxis Praxis Praxis tested?: Not tested    Pertinent Vitals/Pain Pain Assessment Pain Assessment: Faces Faces Pain Scale: Hurts a little bit Pain Location: LLE Pain Descriptors / Indicators: Guarding Pain Intervention(s): Limited activity within patient's tolerance, Monitored during session     Hand Dominance Right   Extremity/Trunk Assessment Upper Extremity Assessment Upper Extremity Assessment: Generalized weakness   Lower Extremity Assessment Lower Extremity Assessment: Defer to PT evaluation   Cervical / Trunk Assessment Cervical / Trunk Assessment: Kyphotic   Communication Communication Communication: HOH (does bed speaking directly into R ear)   Cognition Arousal/Alertness: Awake/alert Behavior During Therapy: WFL for tasks assessed/performed Overall Cognitive Status: History of cognitive impairments - at baseline                                 General Comments: dementia at baseline, oriented x4 with increased time for processing. followed most simple commands. pleasantly confused.     General Comments  VSS on RA, no family present            [place] expects to be discharged to:: Private residence Living Arrangements: Alone Available Help at Discharge: Family Type of Home: House Home Access: Stairs to enter     [place]: One level     Bathroom Shower/Tub: Arts administrator: Standard Bathroom Accessibility: Yes   Home Equipment: Conservation officer, nature (2 wheels)   Additional Comments: home set up gleaned from chart 04/26/22      Prior Functioning/Environment Prior Level of Function : Patient poor historian/Family not available                        OT Problem List: Decreased strength;Decreased range of motion;Decreased activity tolerance;Impaired balance (sitting and/or standing);Decreased cognition;Decreased safety awareness;Decreased knowledge of use of DME or AE;Decreased knowledge of precautions;Pain      OT Treatment/Interventions: Self-care/ADL training;Therapeutic exercise;DME and/or AE instruction;Therapeutic activities;Patient/family education;Balance training    OT Goals(Current goals can be found in the care plan section) Acute Rehab OT Goals Patient Stated Goal: to get clean OT Goal Formulation: With patient Time For Goal Achievement: 01/21/23 Potential to Achieve Goals: Good ADL Goals Pt Will Perform Grooming: with supervision Pt Will Perform Lower Body Dressing: with mod assist;sit to/from stand Pt Will Transfer to Toilet: with mod assist;stand pivot transfer;bedside commode Additional ADL Goal #1: Pt will tolerate 5x of standing ADL task with min A overall  OT Frequency: Min 2X/week    Co-evaluation PT/OT/SLP Co-Evaluation/Treatment: Yes Reason for Co-Treatment: Complexity of the patient's impairments (multi-system involvement);For patient/therapist safety;To address functional/ADL transfers   OT goals addressed during session: ADL's and self-care      AM-PAC OT "6 Clicks" Daily Activity     Outcome Measure Help from another person eating meals?: None Help from another person taking care of personal grooming?: A Little Help from another person toileting,  which includes using toliet, bedpan, or urinal?: A Lot Help from another person bathing (including washing, rinsing, drying)?: A Lot Help from another  person to put on and taking off regular upper body clothing?: A Little Help from another person to put on and taking off regular lower body clothing?: A Lot 6 Click Score: 16   End of Session Nurse Communication: Mobility status  Activity Tolerance: Patient tolerated treatment well Patient left: in bed;with call bell/phone within reach;with bed alarm set  OT Visit Diagnosis: Unsteadiness on feet (R26.81);Other abnormalities of gait and mobility (R26.89);Muscle weakness (generalized) (M62.81);History of falling (Z91.81);Pain                Time: 1009-1031 OT Time Calculation (min): 22 min Charges:  OT General Charges $OT Visit: 1 Visit OT Evaluation $OT Eval Moderate Complexity: 1 Mod  Shade Flood, OTR/L [facility] [place] Communication Preferred    Elliot Cousin 01/07/2023, 11:07 AM

## 2023-01-07 NOTE — Progress Notes (Signed)
PROGRESS NOTE    Candice Ray  [id number] DOB: 1931/11/23 DOA: 01/06/2023 PCP: Alanson Puls [facility]  Outpatient Specialists:     Brief Narrative:  Candice Ray  is a 89 y.o. female with past medical history relevant for significant hearing loss, advanced dementia, DM2 , history of schizophrenia/psychosis and history of recurrent falls who presents to the ED with sudden onset of left hip pain on 01/06/2023.  Left hip x-ray showed subcapital left femoral neck fracture.  Patient underwent surgery yesterday.  01/07/2023: Patient seen.  Patient has no Eglitis to right.  Patient is hard of hearing.  Rising serum creatinine is noted.  Concerns of very decreased urine output.  Will repeat BMP today.  Continue IV fluids.   Assessment & Plan:   Principal Problem:   Closed subcapital fracture of neck of left femur, initial encounter ([place]) Active Problems:   Bilateral sensorineural hearing loss   Dementia without behavioral disturbance (HCC)   DM (diabetes mellitus), type 2 with neurological complications (HCC)   1)Lt Hip Fx- -Left hip x-ray shows subcapital left femoral neck fracture with superolateral displacement of the distal femur -CBC WNL -INR is 1.1 -EKG sinus tachycardia without acute changes -EDP discussed case with on-call orthopedic surgeon Dr. Erlinda Hong who recommends transfer to [facility] for Lt Hip Hemiarthroplasty  --IV fentanyl and p.o. oxycodone and methocarbamol as prescribed for pain control 01/07/2023: Patient underwent surgery yesterday.  Postop management as per surgery team.   2)Dementia/Schizophrenia--- high risk for hospital delirium/psychosis --- Okay to use lorazepam as needed restlessness or agitation -Trazodone nightly as needed 01/07/2023: Manage expectantly.   3)DM2-recent A1c is 8.9 reflecting uncontrolled diabetes with hyperglycemia PTA  -Hold SYNJARDY- -Use Novolog/Humalog Sliding scale insulin with Accu-Cheks/Fingersticks as ordered  01/07/2023: Continue to  monitor and optimize.   4) social/ethics--- Patient is a poor historian due to advanced dementia and significant hearing loss -Most of the history is obtained from patient's daughter-in-law Kimmarie Brieske who is at bedside and patient's son Tyjuana Sotolongo who is on the phone on speaker --Patient is a DNR/DNI,  No limitations to treatment otherwise   5)Dysphagia--- continue dysphagia 3 diet with thin liquids.     6) elevated BP--suspect related to pain associated with left hip fracture -At baseline patient usually does not have significant hypertension -Okay to use IV labetalol as needed elevated B 01/07/2023: Blood pressure is controlled.  Pain is controlled.  7) likely acute kidney injury: -Suspect prerenal. -Continue IV fluid. -Repeat BMP. -Further management will depend on above.  Abdominal distention: -Abdominal x-ray. -Further management will depend on above.   DVT prophylaxis: Subcutaneous Lovenox Code Status: DNR resuscitate Family Communication:  Disposition Plan: This will depend on hospital course   Consultants:  Orthopedics  Procedures:  Left hip hemiarthroplasty.  Antimicrobials:  None.   Subjective: Patient is hard of hearing. No significant history from patient  Objective: Vitals:   01/06/23 2340 01/07/23 0000 01/07/23 0321 01/07/23 0814  BP: 134/79 131/73 118/66 116/66  Pulse: 90 (!) 111 (!) 107 (!) 102  Resp: '15 16 18 18  '$ Temp: 99 F (37.2 C) 98.6 F (37 C) 98.8 F (37.1 C) (!) 97.5 F (36.4 C)  TempSrc: Oral Oral Oral Oral  SpO2: 93% 96% 92% 95%    Intake/Output Summary (Last 24 hours) at 01/07/2023 1040 Last data filed at 01/07/2023 1005 Gross per 24 hour  Intake 1520 ml  Output 700 ml  Net 820 ml   There were no vitals filed for this visit.  Examination:  General exam: Appears calm and comfortable.  Dry buccal mucosa. Respiratory system: Clear to auscultation.  Cardiovascular system: S1 & S2 heard Gastrointestinal system: Abdomen  is nondistended, soft and nontender. No organomegaly or masses felt. Normal bowel sounds heard. Central nervous system: Alert and oriented.  Patient moves extremities.      Data Reviewed: I have personally reviewed following labs and imaging studies  CBC: Recent Labs  Lab 01/06/23 1027 01/06/23 2226 01/07/23 0548  WBC 5.2 10.3 8.6  NEUTROABS 3.8  --   --   HGB 13.3 12.2 11.0*  HCT 40.9 36.3 32.7*  MCV 88.1 87.1 87.0  PLT 204 185 [id number]   Basic Metabolic Panel: Recent Labs  Lab 01/06/23 1027 01/06/23 2226 01/07/23 0506  NA 132*  --  132*  K 4.3  --  4.6  CL 98  --  98  CO2 26  --  20*  GLUCOSE 258*  --  203*  BUN 28*  --  20  CREATININE 1.01* 0.89 1.02*  CALCIUM 8.9  --  8.7*   GFR: CrCl cannot be calculated (Unknown ideal weight.). Liver Function Tests: No results for input(s): "AST", "ALT", "ALKPHOS", "BILITOT", "PROT", "ALBUMIN" in the last 168 hours. No results for input(s): "LIPASE", "AMYLASE" in the last 168 hours. No results for input(s): "AMMONIA" in the last 168 hours. Coagulation Profile: Recent Labs  Lab 01/06/23 1027  INR 1.1   Cardiac Enzymes: No results for input(s): "CKTOTAL", "CKMB", "CKMBINDEX", "TROPONINI" in the last 168 hours. BNP (last 3 results) No results for input(s): "PROBNP" in the last 8760 hours. HbA1C: No results for input(s): "HGBA1C" in the last 72 hours. CBG: Recent Labs  Lab 01/06/23 1606 01/06/23 2051 01/07/23 0815  GLUCAP 165* 154* 195*   Lipid Profile: No results for input(s): "CHOL", "HDL", "LDLCALC", "TRIG", "CHOLHDL", "LDLDIRECT" in the last 72 hours. Thyroid Function Tests: No results for input(s): "TSH", "T4TOTAL", "FREET4", "T3FREE", "THYROIDAB" in the last 72 hours. Anemia Panel: No results for input(s): "VITAMINB12", "FOLATE", "FERRITIN", "TIBC", "IRON", "RETICCTPCT" in the last 72 hours. Urine analysis:    Component Value Date/Time   COLORURINE YELLOW 12/30/2021 0150   APPEARANCEUR CLEAR 12/30/2021 0150    LABSPEC 1.024 12/30/2021 0150   PHURINE 5.0 12/30/2021 0150   GLUCOSEU >=500 (A) 12/30/2021 0150   HGBUR MODERATE (A) 12/30/2021 0150   BILIRUBINUR NEGATIVE 12/30/2021 0150   KETONESUR 80 (A) 12/30/2021 0150   PROTEINUR 30 (A) 12/30/2021 0150   NITRITE NEGATIVE 12/30/2021 0150   LEUKOCYTESUR SMALL (A) 12/30/2021 0150   Sepsis Labs: '@LABRCNTIP'$ (procalcitonin:4,lacticidven:4)  ) Recent Results (from the past 240 hour(s))  Surgical pcr screen     Status: Abnormal   Collection Time: 01/06/23  4:47 PM   Specimen: Nasal Mucosa; Nasal Swab  Result Value Ref Range Status   MRSA, PCR POSITIVE (A) NEGATIVE Final    Comment: RESULT CALLED TO, READ BACK BY AND VERIFIED WITH: EMAILED Charolett Bumpers [phone] @ 2032 FH    Staphylococcus aureus POSITIVE (A) NEGATIVE Final    Comment: (NOTE) The Xpert SA Assay (FDA approved for NASAL specimens in patients 26 years of age and older), is one component of a comprehensive surveillance program. It is not intended to diagnose infection nor to guide or monitor treatment. Performed at [facility], [place] [address], [place], [place] [address]          Radiology Studies: Pelvis Portable  Result Date: 01/06/2023 CLINICAL DATA:  Hip replacement. EXAM: PORTABLE PELVIS 1-2 VIEWS COMPARISON:  Preoperative radiograph earlier today FINDINGS: Left hip arthroplasty in expected alignment. No periprosthetic lucency or fracture. Recent postsurgical change includes air and edema in the soft tissues. Prior right hip arthroplasty and remote left pubic rami fractures again seen. IMPRESSION: Left hip arthroplasty without immediate postoperative complication. Electronically Signed   By: Keith Rake M.D.   On: 01/06/2023 19:53   DG HIP UNILAT WITH PELVIS 1V LEFT  Result Date: 01/06/2023 CLINICAL DATA:  Elective surgery EXAM: DG HIP (WITH OR WITHOUT PELVIS) 1V*L* COMPARISON:  Left hip x-ray 12/2022 FINDINGS: Six intraoperative fluoroscopic views of the left hip.  Left hip arthroplasty was placed. Healed left pubic rami fractures and right hip arthroplasty are unchanged. Fluoroscopy time 7 seconds. Fluoroscopy dose 0.48 micro gray. IMPRESSION: Intraoperative fluoroscopic views of the left hip arthroplasty. Electronically Signed   By: Ronney Asters M.D.   On: 01/06/2023 18:49   DG C-Arm 1-60 Min-No Report  Result Date: 01/06/2023 Fluoroscopy was utilized by the requesting physician.  No radiographic interpretation.   DG HIP UNILAT WITH PELVIS 2-3 VIEWS LEFT  Result Date: 01/06/2023 CLINICAL DATA:  Acute left hip pain. Patient brought in by family with report the left hip pain when patient woke up. EXAM: DG HIP (WITH OR WITHOUT PELVIS) 2-3V LEFT COMPARISON:  Hip radiographs dated April 22, 2022 FINDINGS: There is subcapital left femoral neck fracture with superolateral displacement of the distal femur status post right hip arthroplasty with intact hardware. Osteopenia and degenerate disc disease of the lower lumbar spine. IMPRESSION: Subcapital left femoral neck fracture with superolateral displacement of the distal femur. Electronically Signed   By: Keane Police D.O.   On: 01/06/2023 11:51        Scheduled Meds:  acetaminophen  1,000 mg Oral Q6H   Chlorhexidine Gluconate Cloth  6 each Topical Q0600   docusate sodium  100 mg Oral BID   enoxaparin (LOVENOX) injection  40 mg Subcutaneous Q24H   insulin aspart  0-5 Units Subcutaneous QHS   insulin aspart  0-6 Units Subcutaneous TID WC   methocarbamol  500 mg Oral TID   mupirocin ointment  1 Application Nasal BID   Ensure Max Protein  237 mL Oral Daily   senna-docusate  2 tablet Oral QHS   sodium chloride flush  3 mL Intravenous Q12H   sodium chloride flush  3 mL Intravenous Q12H   Continuous Infusions:  sodium chloride     sodium chloride 75 mL/hr at 01/06/23 2215   lactated ringers 125 mL/hr at 01/06/23 1028   methocarbamol (ROBAXIN) IV       LOS: 1 day    Time spent: 55  minutes.    Dana Allan, MD  [facility] Pager #: [phone] 7PM-7AM contact night coverage as above

## 2023-01-08 ENCOUNTER — Encounter (HOSPITAL_COMMUNITY): Payer: Self-pay | Admitting: Orthopaedic Surgery

## 2023-01-08 DIAGNOSIS — S72012A Unspecified intracapsular fracture of left femur, initial encounter for closed fracture: Secondary | ICD-10-CM | POA: Diagnosis not present

## 2023-01-08 LAB — CBC WITH DIFFERENTIAL/PLATELET
Abs Immature Granulocytes: 0.01 10*3/uL (ref 0.00–0.07)
Basophils Absolute: 0 10*3/uL (ref 0.0–0.1)
Basophils Relative: 0 %
Eosinophils Absolute: 0.2 10*3/uL (ref 0.0–0.5)
Eosinophils Relative: 2 %
HCT: 31.5 % — ABNORMAL LOW (ref 36.0–46.0)
Hemoglobin: 10.7 g/dL — ABNORMAL LOW (ref 12.0–15.0)
Immature Granulocytes: 0 %
Lymphocytes Relative: 13 %
Lymphs Abs: 1 10*3/uL (ref 0.7–4.0)
MCH: 30.1 pg (ref 26.0–34.0)
MCHC: 34 g/dL (ref 30.0–36.0)
MCV: 88.5 fL (ref 80.0–100.0)
Monocytes Absolute: 0.8 10*3/uL (ref 0.1–1.0)
Monocytes Relative: 10 %
Neutro Abs: 5.7 10*3/uL (ref 1.7–7.7)
Neutrophils Relative %: 75 %
Platelets: 146 10*3/uL — ABNORMAL LOW (ref 150–400)
RBC: 3.56 MIL/uL — ABNORMAL LOW (ref 3.87–5.11)
RDW: 13.3 % (ref 11.5–15.5)
WBC: 7.7 10*3/uL (ref 4.0–10.5)
nRBC: 0 % (ref 0.0–0.2)

## 2023-01-08 LAB — MAGNESIUM: Magnesium: 1.5 mg/dL — ABNORMAL LOW (ref 1.7–2.4)

## 2023-01-08 LAB — BASIC METABOLIC PANEL
Anion gap: 12 (ref 5–15)
BUN: 15 mg/dL (ref 8–23)
CO2: 21 mmol/L — ABNORMAL LOW (ref 22–32)
Calcium: 8.5 mg/dL — ABNORMAL LOW (ref 8.9–10.3)
Chloride: 101 mmol/L (ref 98–111)
Creatinine, Ser: 0.79 mg/dL (ref 0.44–1.00)
GFR, Estimated: >60 mL/min (ref >60)
Glucose, Bld: 135 mg/dL — ABNORMAL HIGH (ref 70–99)
Potassium: 3.7 mmol/L (ref 3.5–5.1)
Sodium: 134 mmol/L — ABNORMAL LOW (ref 135–145)

## 2023-01-08 LAB — GLUCOSE, CAPILLARY
Glucose-Capillary: 147 mg/dL — ABNORMAL HIGH (ref 70–99)
Glucose-Capillary: 216 mg/dL — ABNORMAL HIGH (ref 70–99)
Glucose-Capillary: 266 mg/dL — ABNORMAL HIGH (ref 70–99)
Glucose-Capillary: 340 mg/dL — ABNORMAL HIGH (ref 70–99)

## 2023-01-08 NOTE — Progress Notes (Signed)
PROGRESS NOTE    Candice Ray  [id number] DOB: [birth year] DOA: 01/06/2023 PCP: Alanson Puls [facility]  Outpatient Specialists:     Brief Narrative:  Candice Ray  is a 89 y.o. female with past medical history significant for hearing loss, advanced dementia, DM2 , history of schizophrenia/psychosis and history of recurrent falls who presents to the ED with sudden onset of left hip pain on 01/06/2023.  Left hip x-ray showed subcapital left femoral neck fracture.  Patient underwent left hip hemiarthroplasty on 01/06/2023.    01/07/2023: Patient seen.  Patient has no Eglitis to right.  Patient is hard of hearing.  Rising serum creatinine is noted.  Concerns of very decreased urine output.  Will repeat BMP today.  Continue IV fluids.  01/08/2023: Patient seen.  Urine output is improving.  Serum creatinine is on the downward trend.  Pain is controlled.  PT OT have recommended short-term rehabilitation.  Continue gentle hydration for now.  Likely discharge to skilled nursing facility.  Assessment & Plan:   Principal Problem:   Closed subcapital fracture of neck of left femur, initial encounter ([facility]) Active Problems:   Bilateral sensorineural hearing loss   Dementia without behavioral disturbance (HCC)   DM (diabetes mellitus), type 2 with neurological complications (HCC)   1)Lt Hip Fx- -Left hip x-ray shows subcapital left femoral neck fracture with superolateral displacement of the distal femur -CBC WNL -INR is 1.1 -EKG sinus tachycardia without acute changes -EDP discussed case with on-call orthopedic surgeon Dr. Erlinda Hong who recommends transfer to [facility] for Lt Hip Hemiarthroplasty  --IV fentanyl and p.o. oxycodone and methocarbamol as prescribed for pain control 01/07/2023: Patient underwent surgery yesterday.  Postop management as per surgery team. 01/08/2023: See above documentation.   2)Dementia/Schizophrenia--- high risk for hospital delirium/psychosis --- Okay to use lorazepam as  needed restlessness or agitation -Trazodone nightly as needed 01/07/2023: Manage expectantly.   3)DM2-recent A1c is 8.9 reflecting uncontrolled diabetes with hyperglycemia PTA  -Hold SYNJARDY- -Use Novolog/Humalog Sliding scale insulin with Accu-Cheks/Fingersticks as ordered  01/07/2023: Continue to monitor and optimize.   4) social/ethics--- Patient is a poor historian due to advanced dementia and significant hearing loss -Most of the history is obtained from patient's daughter-in-law Gerrilyn Levings who is at bedside and patient's son Burgundy Ewald who is on the phone on speaker --Patient is a DNR/DNI,  No limitations to treatment otherwise   5)Dysphagia--- continue dysphagia 3 diet with thin liquids.     6) elevated BP--suspect related to pain associated with left hip fracture -At baseline patient usually does not have significant hypertension -Okay to use IV labetalol as needed elevated B 01/08/2023: Blood pressure is controlled.  Pain is controlled.  7) likely acute kidney injury: -Suspect prerenal. -Continue IV fluid. -Repeat BMP. -Further management will depend on above.  Abdominal distention: -Abdominal x-ray revealed postoperative ileus.   DVT prophylaxis: Subcutaneous Lovenox Code Status: DNR resuscitate Family Communication:  Disposition Plan: This will depend on hospital course   Consultants:  Orthopedics  Procedures:  Left hip hemiarthroplasty.  Antimicrobials:  None.   Subjective: Patient is hard of hearing. No significant history from patient  Objective: Vitals:   01/07/23 1528 01/07/23 2300 01/08/23 0615 01/08/23 0837  BP: 123/65  134/67 128/63  Pulse: 100 97 (!) 107 (!) 110  Resp: '18  18 18  '$ Temp: 98 F (36.7 C)  97.9 F (36.6 C) 98.2 F (36.8 C)  TempSrc:   Oral Oral  SpO2: 94% 97% 93% 94%  Intake/Output Summary (Last 24 hours) at 01/08/2023 1535 Last data filed at 01/08/2023 1257 Gross per 24 hour  Intake 1423.98 ml  Output 1150 ml   Net 273.98 ml    There were no vitals filed for this visit.  Examination:  General exam: Appears calm and comfortable.  Dry buccal mucosa. Respiratory system: Clear to auscultation.  Cardiovascular system: S1 & S2 heard Gastrointestinal system: Abdomen is nondistended, soft and nontender. No organomegaly or masses felt. Normal bowel sounds heard. Central nervous system: Alert and oriented.  Patient moves extremities.      Data Reviewed: I have personally reviewed following labs and imaging studies  CBC: Recent Labs  Lab 01/06/23 1027 01/06/23 2226 01/07/23 0548 01/08/23 0436  WBC 5.2 10.3 8.6 7.7  NEUTROABS 3.8  --   --  5.7  HGB 13.3 12.2 11.0* 10.7*  HCT 40.9 36.3 32.7* 31.5*  MCV 88.1 87.1 87.0 88.5  PLT 204 185 185 146*    Basic Metabolic Panel: Recent Labs  Lab 01/06/23 1027 01/06/23 2226 01/07/23 0506 01/07/23 1812 01/08/23 0436  NA 132*  --  132* 131* 134*  K 4.3  --  4.6 3.7 3.7  CL 98  --  98 100 101  CO2 26  --  20* 23 21*  GLUCOSE 258*  --  203* 163* 135*  BUN 28*  --  '20 20 15  '$ CREATININE 1.01* 0.89 1.02* 1.02* 0.79  CALCIUM 8.9  --  8.7* 8.5* 8.5*  MG  --   --   --   --  1.5*  PHOS  --   --   --  3.0  --     GFR: CrCl cannot be calculated (Unknown ideal weight.). Liver Function Tests: Recent Labs  Lab 01/07/23 1812  ALBUMIN 3.0*   No results for input(s): "LIPASE", "AMYLASE" in the last 168 hours. No results for input(s): "AMMONIA" in the last 168 hours. Coagulation Profile: Recent Labs  Lab 01/06/23 1027  INR 1.1    Cardiac Enzymes: No results for input(s): "CKTOTAL", "CKMB", "CKMBINDEX", "TROPONINI" in the last 168 hours. BNP (last 3 results) No results for input(s): "PROBNP" in the last 8760 hours. HbA1C: No results for input(s): "HGBA1C" in the last 72 hours. CBG: Recent Labs  Lab 01/07/23 1149 01/07/23 1525 01/07/23 2110 01/08/23 0834 01/08/23 1154  GLUCAP 178* 150* 167* 147* 340*    Lipid Profile: No results  for input(s): "CHOL", "HDL", "LDLCALC", "TRIG", "CHOLHDL", "LDLDIRECT" in the last 72 hours. Thyroid Function Tests: No results for input(s): "TSH", "T4TOTAL", "FREET4", "T3FREE", "THYROIDAB" in the last 72 hours. Anemia Panel: No results for input(s): "VITAMINB12", "FOLATE", "FERRITIN", "TIBC", "IRON", "RETICCTPCT" in the last 72 hours. Urine analysis:    Component Value Date/Time   COLORURINE YELLOW 12/30/2021 0150   APPEARANCEUR CLEAR 12/30/2021 0150   LABSPEC 1.024 12/30/2021 0150   PHURINE 5.0 12/30/2021 0150   GLUCOSEU >=500 (A) 12/30/2021 0150   HGBUR MODERATE (A) 12/30/2021 0150   BILIRUBINUR NEGATIVE 12/30/2021 0150   KETONESUR 80 (A) 12/30/2021 0150   PROTEINUR 30 (A) 12/30/2021 0150   NITRITE NEGATIVE 12/30/2021 0150   LEUKOCYTESUR SMALL (A) 12/30/2021 0150   Sepsis Labs: '@LABRCNTIP'$ (procalcitonin:4,lacticidven:4)  ) Recent Results (from the past 240 hour(s))  Surgical pcr screen     Status: Abnormal   Collection Time: 01/06/23  4:47 PM   Specimen: Nasal Mucosa; Nasal Swab  Result Value Ref Range Status   MRSA, PCR POSITIVE (A) NEGATIVE Final    Comment: RESULT CALLED TO,  READ BACK BY AND VERIFIED WITH: Wylene Simmer [phone] @ 2032 FH    Staphylococcus aureus POSITIVE (A) NEGATIVE Final    Comment: (NOTE) The Xpert SA Assay (FDA approved for NASAL specimens in patients 27 years of age and older), is one component of a comprehensive surveillance program. It is not intended to diagnose infection nor to guide or monitor treatment. Performed at [facility], [place] [address] [place], [place] [address]          Radiology Studies: DG Abd 1 View  Result Date: 01/07/2023 CLINICAL DATA:  Abdominal distention EXAM: ABDOMEN - 1 VIEW COMPARISON:  Pelvis radiograph 01/06/2023 and CT abdomen and pelvis 03/18/2021 FINDINGS: Diffuse gaseous distention of the colon which is at the upper limits of normal in the cecum and ascending/transverse colon. Gas is present  within the rectum. No radiographic evidence of obstruction. Findings are favored to represent postoperative ileus given recent surgery. Bilateral hip hemiarthroplasty. IMPRESSION: Constellation of findings favored to represent postoperative ileus. Electronically Signed   By: Placido Sou M.D.   On: 01/07/2023 19:42   Pelvis Portable  Result Date: 01/06/2023 CLINICAL DATA:  Hip replacement. EXAM: PORTABLE PELVIS 1-2 VIEWS COMPARISON:  Preoperative radiograph earlier today FINDINGS: Left hip arthroplasty in expected alignment. No periprosthetic lucency or fracture. Recent postsurgical change includes air and edema in the soft tissues. Prior right hip arthroplasty and remote left pubic rami fractures again seen. IMPRESSION: Left hip arthroplasty without immediate postoperative complication. Electronically Signed   By: Keith Rake M.D.   On: 01/06/2023 19:53   DG HIP UNILAT WITH PELVIS 1V LEFT  Result Date: 01/06/2023 CLINICAL DATA:  Elective surgery EXAM: DG HIP (WITH OR WITHOUT PELVIS) 1V*L* COMPARISON:  Left hip x-ray 12/2022 FINDINGS: Six intraoperative fluoroscopic views of the left hip. Left hip arthroplasty was placed. Healed left pubic rami fractures and right hip arthroplasty are unchanged. Fluoroscopy time 7 seconds. Fluoroscopy dose 0.48 micro gray. IMPRESSION: Intraoperative fluoroscopic views of the left hip arthroplasty. Electronically Signed   By: Ronney Asters M.D.   On: 01/06/2023 18:49   DG C-Arm 1-60 Min-No Report  Result Date: 01/06/2023 Fluoroscopy was utilized by the requesting physician.  No radiographic interpretation.        Scheduled Meds:  docusate sodium  100 mg Oral BID   enoxaparin (LOVENOX) injection  40 mg Subcutaneous Q24H   insulin aspart  0-5 Units Subcutaneous QHS   insulin aspart  0-6 Units Subcutaneous TID WC   methocarbamol  500 mg Oral TID   mupirocin ointment  1 Application Nasal BID   Ensure Max Protein  237 mL Oral Daily   senna-docusate  2 tablet  Oral QHS   sodium chloride flush  3 mL Intravenous Q12H   sodium chloride flush  3 mL Intravenous Q12H   Continuous Infusions:  sodium chloride     sodium chloride 75 mL/hr at 01/08/23 0040   lactated ringers 125 mL/hr at 01/06/23 1028   methocarbamol (ROBAXIN) IV       LOS: 2 days    Time spent: 35 minutes.    Dana Allan, MD  [facility] Pager #: [phone] 7PM-7AM contact night coverage as above

## 2023-01-08 NOTE — Plan of Care (Signed)
°  Problem: Skin Integrity: °Goal: Risk for impaired skin integrity will decrease °Outcome: Progressing °  °Problem: Activity: °Goal: Risk for activity intolerance will decrease °Outcome: Progressing °  °Problem: Safety: °Goal: Ability to remain free from injury will improve °Outcome: Progressing °  °

## 2023-01-09 ENCOUNTER — Telehealth: Payer: Self-pay

## 2023-01-09 LAB — GLUCOSE, CAPILLARY
Glucose-Capillary: 198 mg/dL — ABNORMAL HIGH (ref 70–99)
Glucose-Capillary: 235 mg/dL — ABNORMAL HIGH (ref 70–99)
Glucose-Capillary: 277 mg/dL — ABNORMAL HIGH (ref 70–99)
Glucose-Capillary: 216 mg/dL — ABNORMAL HIGH (ref 70–99)

## 2023-01-09 LAB — HEMOGLOBIN A1C
Hgb A1c MFr Bld: 9.7 % — ABNORMAL HIGH (ref 4.8–5.6)
Mean Plasma Glucose: 232 mg/dL

## 2023-01-09 MED ORDER — METOPROLOL TARTRATE 5 MG/5ML IV SOLN: 5.0000 mg | INTRAVENOUS | Status: DC | PRN

## 2023-01-09 MED ORDER — INSULIN ASPART 100 UNIT/ML IJ SOLN
3.0000 [IU] | Freq: Three times a day (TID) | INTRAMUSCULAR | Status: DC
  Administered 2023-01-09 – 2023-01-13 (×11): 3 [IU] via SUBCUTANEOUS

## 2023-01-09 MED ORDER — IPRATROPIUM-ALBUTEROL 0.5-2.5 (3) MG/3ML IN SOLN: 3.0000 mL | RESPIRATORY_TRACT | Status: DC | PRN

## 2023-01-09 MED ORDER — SENNOSIDES-DOCUSATE SODIUM 8.6-50 MG PO TABS: 1.0000 | ORAL_TABLET | Freq: Every evening | ORAL | Status: DC | PRN

## 2023-01-09 MED ORDER — HYDRALAZINE HCL 20 MG/ML IJ SOLN: 10.0000 mg | INTRAMUSCULAR | Status: DC | PRN

## 2023-01-09 MED ORDER — GUAIFENESIN 100 MG/5ML PO LIQD: 5.0000 mL | ORAL | Status: DC | PRN

## 2023-01-09 NOTE — Telephone Encounter (Signed)
thnx

## 2023-01-09 NOTE — Progress Notes (Signed)
Physical Therapy Treatment Patient Details Name: Candice Ray MRN: [id number] DOB: May 27, 1932 Today's Date: 01/09/2023   History of Present Illness 89 y/o F transferred from [facility] to [facility] on 3/8 for L hip pain, found to have a L femoral neck fracture. Pt now s/p L hip hemi-arthroplasty on 3/8, WBAT without hip precautions. PMHx: dementia/schizophrenia, DM, hearing loss.    PT Comments    Pt admitted with above diagnosis. Pt was not willing to participate today as she had her meal and got upset that PT was going to take it from her (even though initially she agreed to get up).  Ultimately pt only did a few exercises and refused to get OOB.  Will try to work around meals.   Pt currently with functional limitations due to balance and endurance deficits. Pt will benefit from skilled PT to increase their independence and safety with mobility to allow discharge to the venue listed below.      Recommendations for follow up therapy are one component of a multi-disciplinary discharge planning process, led by the attending physician.  Recommendations may be updated based on patient status, additional functional criteria and insurance authorization.  Follow Up Recommendations  Skilled nursing-short term rehab (<3 hours/day) Can patient physically be transported by private vehicle: No   Assistance Recommended at Discharge Frequent or constant Supervision/Assistance  Patient can return home with the following A lot of help with walking and/or transfers;Assistance with cooking/housework;Direct supervision/assist for medications management;Assist for transportation;Help with stairs or ramp for entrance   Equipment Recommendations  None recommended by PT (defer to next level)    Recommendations for Other Services       Precautions / Restrictions Precautions Precautions: Fall Restrictions Weight Bearing Restrictions: No LLE Weight Bearing: Weight bearing as tolerated     Mobility  Bed  Mobility Overal bed mobility: Needs Assistance Bed Mobility: Rolling, Sidelying to Sit, Sit to Supine Rolling: Min assist, +2 for safety/equipment         General bed mobility comments: Pt had tray in front of her on arrival.  initially pt stated she wanted to get up to eat.  Then when tray was moved, pt got upset and would not allow PT to continue.  Nurse came in and tried to talk pt into getting up as well and pt still refused..  noted pt with alot of food in mouth at all times during session. Nurse is aware and states that this is pts baseline.  She chews and chews per family and takes her 2 hours to eat.    Transfers                   General transfer comment: Pt refused to come to eOB    Ambulation/Gait               General Gait Details: unable as pt limited by cognition/HOH   Stairs             Wheelchair Mobility    Modified Rankin (Stroke Patients Only)       Balance                                            Cognition Arousal/Alertness: Awake/alert Behavior During Therapy: WFL for tasks assessed/performed Overall Cognitive Status: History of cognitive impairments - at baseline  General Comments: Pt A&Ox4, has history of dementia and requires increased time for processing, pleasant throughout session and follows some commands but limited by hearing        Exercises General Exercises - Lower Extremity Ankle Circles/Pumps: AROM, Both, 5 reps, Supine Quad Sets: AROM, Both, 5 reps, Supine Heel Slides: AAROM, Both, Supine, 5 reps    General Comments General comments (skin integrity, edema, etc.): VSS on RA      Pertinent Vitals/Pain Pain Assessment Pain Assessment: Faces Faces Pain Scale: Hurts a little bit Breathing: normal Negative Vocalization: occasional moan/groan, low speech, negative/disapproving quality Facial Expression: smiling or inexpressive Body Language:  tense, distressed pacing, fidgeting Consolability: no need to console PAINAD Score: 2 Pain Location: LLE Pain Descriptors / Indicators: Guarding Pain Intervention(s): Limited activity within patient's tolerance, Monitored during session, Repositioned    Home Living                          Prior Function            PT Goals (current goals can now be found in the care plan section) Acute Rehab PT Goals Patient Stated Goal: none stated today Progress towards PT goals: Not progressing toward goals - comment (self limiting)    Frequency    Min 3X/week      PT Plan Current plan remains appropriate    Co-evaluation              AM-PAC PT "6 Clicks" Mobility   Outcome Measure  Help needed turning from your back to your side while in a flat bed without using bedrails?: A Lot Help needed moving from lying on your back to sitting on the side of a flat bed without using bedrails?: A Lot Help needed moving to and from a bed to a chair (including a wheelchair)?: A Lot Help needed standing up from a chair using your arms (e.g., wheelchair or bedside chair)?: A Lot Help needed to walk in hospital room?: Total Help needed climbing 3-5 steps with a railing? : Total 6 Click Score: 10    End of Session   Activity Tolerance:  (pt self limiting) Patient left: in bed;with call bell/phone within reach;with bed alarm set;with SCD's reapplied Nurse Communication: Mobility status PT Visit Diagnosis: Muscle weakness (generalized) (M62.81);History of falling (Z91.81);Difficulty in walking, not elsewhere classified (R26.2)     Time: 1341-1400 PT Time Calculation (min) (ACUTE ONLY): 19 min  Charges:  $Therapeutic Activity: 8-22 mins                     [facility] M,PT Acute Rehab Services [phone]    Alvira Philips 01/09/2023, 4:17 PM

## 2023-01-09 NOTE — Progress Notes (Signed)
PROGRESS NOTE    Candice Ray  [id number] DOB: 1932/10/10 DOA: 01/06/2023 PCP: Alanson Puls [facility]   Brief Narrative:  89 y.o. female with past medical history significant for hearing loss, advanced dementia, DM2 , history of schizophrenia/psychosis and history of recurrent falls who presents to the ED with sudden onset of left hip pain on 01/06/2023.  Left hip x-ray showed subcapital left femoral neck fracture.  Patient underwent left hip hemiarthroplasty on 01/06/2023.   PT OT recommending SNF.   Assessment & Plan:  Principal Problem:   Closed subcapital fracture of neck of left femur, initial encounter ([place]) Active Problems:   DM (diabetes mellitus), type 2 with neurological complications (HCC)   Dementia without behavioral disturbance (HCC)   Bilateral sensorineural hearing loss    Left hip fracture status post hemiarthroplasty 3/8 - Postop management by orthopedic surgery including pain management, DVT prophylaxis, weightbearing precautions, dressing changes. PT/OT recommending SNF     Dementia/schizophrenia. -Point-of-care   DM2 -A1c 8.9.  On sliding scale and Accu-Cheks.   Social/ethics -Has advanced dementia and hearing loss.  Patient is DNR/DNI   Dysphagia -Dysphagia 3 diet   Elevated BP -Related to pain.  As needed   Acute kidney injury Baseline creatinine 0.7, peaked at 1.02 improved with fluids   Abdominal distention secondary to ileus -Abdominal x-ray revealed postoperative ileus.  Out of bed to chair     DVT prophylaxis: Subcutaneous Lovenox Code Status: DNR resuscitate Family Communication:  Disposition Plan: This will depend on hospital course  Status is: Inpatient Remains inpatient appropriate because: Pending placement    Subjective: Hard of hearing.  No complaints   Examination:  General exam: Appears calm and comfortable, very hard of hearing Respiratory system: Clear to auscultation. Respiratory effort normal. Cardiovascular  system: S1 & S2 heard, RRR. No JVD, murmurs, rubs, gallops or clicks. No pedal edema. Gastrointestinal system: Abdomen is nondistended, soft and nontender. No organomegaly or masses felt. Normal bowel sounds heard. Central nervous system: Alert and oriented. No focal neurological deficits. Extremities: Symmetric 5 x 5 power. Skin: Surgical sting site noted. Psychiatry: Judgement and insight appear poor  Objective: Vitals:   01/08/23 1559 01/08/23 1900 01/09/23 0600 01/09/23 0739  BP: 134/64 128/70 139/70 139/76  Pulse: (!) 104 (!) 109 (!) 105 (!) 107  Resp: '18 18 16 18  '$ Temp: 98.8 F (37.1 C) 100.2 F (37.9 C) 98.3 F (36.8 C) (!) 97.5 F (36.4 C)  TempSrc: Oral Oral Oral Oral  SpO2: 95% 93% 99%   Weight:   61 kg     Intake/Output Summary (Last 24 hours) at 01/09/2023 0818 Last data filed at 01/09/2023 0600 Gross per 24 hour  Intake 475 ml  Output 2625 ml  Net -2150 ml   Filed Weights   01/09/23 0600  Weight: 61 kg     Data Reviewed:   CBC: Recent Labs  Lab 01/06/23 1027 01/06/23 2226 01/07/23 0548 01/08/23 0436  WBC 5.2 10.3 8.6 7.7  NEUTROABS 3.8  --   --  5.7  HGB 13.3 12.2 11.0* 10.7*  HCT 40.9 36.3 32.7* 31.5*  MCV 88.1 87.1 87.0 88.5  PLT 204 185 185 [id number]*   Basic Metabolic Panel: Recent Labs  Lab 01/06/23 1027 01/06/23 2226 01/07/23 0506 01/07/23 1812 01/08/23 0436  NA 132*  --  132* 131* 134*  K 4.3  --  4.6 3.7 3.7  CL 98  --  98 100 101  CO2 26  --  20* 23 21*  GLUCOSE 258*  --  203* 163* 135*  BUN 28*  --  '20 20 15  '$ CREATININE 1.01* 0.89 1.02* 1.02* 0.79  CALCIUM 8.9  --  8.7* 8.5* 8.5*  MG  --   --   --   --  1.5*  PHOS  --   --   --  3.0  --    GFR: CrCl cannot be calculated (Unknown ideal weight.). Liver Function Tests: Recent Labs  Lab 01/07/23 1812  ALBUMIN 3.0*   No results for input(s): "LIPASE", "AMYLASE" in the last 168 hours. No results for input(s): "AMMONIA" in the last 168 hours. Coagulation Profile: Recent Labs   Lab 01/06/23 1027  INR 1.1   Cardiac Enzymes: No results for input(s): "CKTOTAL", "CKMB", "CKMBINDEX", "TROPONINI" in the last 168 hours. BNP (last 3 results) No results for input(s): "PROBNP" in the last 8760 hours. HbA1C: No results for input(s): "HGBA1C" in the last 72 hours. CBG: Recent Labs  Lab 01/07/23 2110 01/08/23 0834 01/08/23 1154 01/08/23 1556 01/08/23 2355  GLUCAP 167* 147* 340* 266* 216*   Lipid Profile: No results for input(s): "CHOL", "HDL", "LDLCALC", "TRIG", "CHOLHDL", "LDLDIRECT" in the last 72 hours. Thyroid Function Tests: No results for input(s): "TSH", "T4TOTAL", "FREET4", "T3FREE", "THYROIDAB" in the last 72 hours. Anemia Panel: No results for input(s): "VITAMINB12", "FOLATE", "FERRITIN", "TIBC", "IRON", "RETICCTPCT" in the last 72 hours. Sepsis Labs: No results for input(s): "PROCALCITON", "LATICACIDVEN" in the last 168 hours.  Recent Results (from the past 240 hour(s))  Surgical pcr screen     Status: Abnormal   Collection Time: 01/06/23  4:47 PM   Specimen: Nasal Mucosa; Nasal Swab  Result Value Ref Range Status   MRSA, PCR POSITIVE (A) NEGATIVE Final    Comment: RESULT CALLED TO, READ BACK BY AND VERIFIED WITH: EMAILED Charolett Bumpers [phone] @ 2032 FH    Staphylococcus aureus POSITIVE (A) NEGATIVE Final    Comment: (NOTE) The Xpert SA Assay (FDA approved for NASAL specimens in patients 49 years of age and older), is one component of a comprehensive surveillance program. It is not intended to diagnose infection nor to guide or monitor treatment. Performed at [facility], [place] [address], [place], [place] [address]          Radiology Studies: DG Abd 1 View  Result Date: 01/07/2023 CLINICAL DATA:  Abdominal distention EXAM: ABDOMEN - 1 VIEW COMPARISON:  Pelvis radiograph 01/06/2023 and CT abdomen and pelvis 03/18/2021 FINDINGS: Diffuse gaseous distention of the colon which is at the upper limits of normal in the cecum and  ascending/transverse colon. Gas is present within the rectum. No radiographic evidence of obstruction. Findings are favored to represent postoperative ileus given recent surgery. Bilateral hip hemiarthroplasty. IMPRESSION: Constellation of findings favored to represent postoperative ileus. Electronically Signed   By: Placido Sou M.D.   On: 01/07/2023 19:42        Scheduled Meds:  docusate sodium  100 mg Oral BID   enoxaparin (LOVENOX) injection  40 mg Subcutaneous Q24H   insulin aspart  0-5 Units Subcutaneous QHS   insulin aspart  0-6 Units Subcutaneous TID WC   methocarbamol  500 mg Oral TID   mupirocin ointment  1 Application Nasal BID   Ensure Max Protein  237 mL Oral Daily   senna-docusate  2 tablet Oral QHS   sodium chloride flush  3 mL Intravenous Q12H   sodium chloride flush  3 mL Intravenous Q12H   Continuous Infusions:  sodium chloride  sodium chloride 75 mL/hr at 01/08/23 1537   lactated ringers 125 mL/hr at 01/06/23 1028   methocarbamol (ROBAXIN) IV       LOS: 3 days   Time spent= 35 mins    Kamie Korber Arsenio Loader, MD [facility]  If 7PM-7AM, please contact night-coverage  01/09/2023, 8:18 AM

## 2023-01-09 NOTE — Progress Notes (Signed)
21:20 - RN attempted to give medications at this time, patient refused. Stating that the pills from earlier hurt her stomach. She proceeded to cover her mouth and arm to avoid administration of oral medications and insulin. Attempted to educate patient but patient is unable to comprehend due to impaired hearing and cognitive orientation at this time.

## 2023-01-09 NOTE — Inpatient Diabetes Management (Signed)
Inpatient Diabetes Program Recommendations  AACE/ADA: New Consensus Statement on Inpatient Glycemic Control  Target Ranges:  Prepandial:   less than 140 mg/dL      Peak postprandial:   less than 180 mg/dL (1-2 hours)      Critically ill patients:  140 - 180 mg/dL    Latest Reference Range & Units 01/08/23 08:34 01/08/23 11:54 01/08/23 15:56 01/08/23 23:55 01/09/23 08:23  Glucose-Capillary 70 - 99 mg/dL 147 (H) 340 (H) 266 (H) 216 (H) 198 (H)   Review of Glycemic Control  Diabetes history: DM2 Outpatient Diabetes medications: Synjardy 12.02-999 mg BID, Januvia 25 mg daily Current orders for Inpatient glycemic control: Novolog 0-6 units TID with meals, Novolog 0-5 units QHS  Inpatient Diabetes Program Recommendations:    Insulin: Please consider ordering Novolog 3 units TID with meals for meal coverage if patient eats at least 50% of meals.  Thanks, Barnie Alderman, RN, MSN, [place] Diabetes Coordinator Inpatient Diabetes Program [phone] (Team Pager from 8am to [place])

## 2023-01-09 NOTE — Progress Notes (Signed)
Candice Ray with Dr. Phoebe Sharps office notified of positive MSSA and MRSA on surgical PCR

## 2023-01-09 NOTE — NC FL2 (Signed)
[place] LEVEL OF CARE FORM     IDENTIFICATION  Patient Name: Candice Ray Birthdate: Mar 21, 1932 Sex: female Admission Date (Current Location): 01/06/2023  [facility] and [place] Number:  Herbalist and Address:  [facility] [place] [address] [place], [place] [address]      Provider Number: [id number]  Attending Physician Name and Address:  Damita Lack, MD  Relative Name and Phone Number:  Sandralee, Foto   [phone]    Current Level of Care: Hospital Recommended Level of Care: [place] Prior Approval Number:    Date Approved/Denied:   PASRR Number: [id number]  Discharge Plan: SNF    Current Diagnoses: Patient Active Problem List   Diagnosis Date Noted   Closed subcapital fracture of neck of left femur, initial encounter ([place]) 01/06/2023   Moderate protein-calorie malnutrition ([place]) 04/29/2022   Pressure injury of buttock, stage 1 04/23/2022   Schizophrenia ([place]) 04/19/2022   Aortic atherosclerosis ([place]) 01/06/2022   Pressure injury of skin 12/31/2021   Pelvic fracture/Lt Superior Pubis Ramus Fracture 12/30/2021   Dementia without behavioral disturbance ([place]) 12/30/2021   DM (diabetes mellitus), type 2 with neurological complications ([place]) [id number]   Bilateral sensorineural hearing loss 06/14/2017   RLQ abdominal pain 12/24/2013   Chronic diarrhea 12/24/2013   Bowel habit changes 12/24/2013   Hx of adenomatous colonic polyps 12/24/2013    Orientation RESPIRATION BLADDER Height & Weight        Normal Incontinent, External catheter Weight: 134 lb 7.7 oz (61 kg) Height:     BEHAVIORAL SYMPTOMS/MOOD NEUROLOGICAL BOWEL NUTRITION STATUS      Incontinent Diet (see d/c summary)  AMBULATORY STATUS COMMUNICATION OF NEEDS Skin   Extensive Assist Verbally Normal                       Personal Care Assistance Level of Assistance  Bathing, Feeding, Dressing Bathing Assistance: Maximum  assistance Feeding assistance: Independent Dressing Assistance: Limited assistance     Functional Limitations Info  Sight, Speech, Hearing Sight Info: Adequate Hearing Info: Impaired Speech Info: Adequate    SPECIAL CARE FACTORS FREQUENCY  PT (By licensed PT), OT (By licensed OT)     PT Frequency: 5x/wk OT Frequency: 5x/wk            Contractures Contractures Info: Not present    Additional Factors Info  Code Status Code Status Info: DNR             Current Medications (01/09/2023):  This is the current hospital active medication list Current Facility-Administered Medications  Medication Dose Route Frequency Provider Last Rate Last Admin   0.9 %  sodium chloride infusion   Intravenous PRN Leandrew Koyanagi, MD       0.9 %  sodium chloride infusion   Intravenous Continuous Leandrew Koyanagi, MD   Stopped at 01/09/23 1151   acetaminophen (TYLENOL) tablet 325-650 mg  325-650 mg Oral Q6H PRN Leandrew Koyanagi, MD   650 mg at 01/09/23 1305   bisacodyl (DULCOLAX) suppository 10 mg  10 mg Rectal Daily PRN Leandrew Koyanagi, MD       docusate sodium (COLACE) capsule 100 mg  100 mg Oral BID Leandrew Koyanagi, MD   100 mg at 01/09/23 0925   enoxaparin (LOVENOX) injection 40 mg  40 mg Subcutaneous Q24H Leandrew Koyanagi, MD   40 mg at 01/08/23 1628   fentaNYL (SUBLIMAZE) injection 25 mcg  25  mcg Intravenous Q2H PRN Leandrew Koyanagi, MD       guaiFENesin (ROBITUSSIN) 100 MG/5ML liquid 5 mL  5 mL Oral Q4H PRN Amin, Ankit Chirag, MD       hydrALAZINE (APRESOLINE) injection 10 mg  10 mg Intravenous Q4H PRN Amin, Ankit Chirag, MD       HYDROmorphone (DILAUDID) injection 0.5-1 mg  0.5-1 mg Intravenous Q4H PRN Leandrew Koyanagi, MD       insulin aspart (novoLOG) injection 0-5 Units  0-5 Units Subcutaneous QHS Leandrew Koyanagi, MD   2 Units at 01/08/23 2300   insulin aspart (novoLOG) injection 0-6 Units  0-6 Units Subcutaneous TID WC Leandrew Koyanagi, MD   3 Units at 01/09/23 1338   insulin aspart (novoLOG) injection 3 Units   3 Units Subcutaneous TID WC Amin, Ankit Chirag, MD       ipratropium-albuterol (DUONEB) 0.5-2.5 (3) MG/3ML nebulizer solution 3 mL  3 mL Nebulization Q4H PRN Amin, Jeanella Flattery, MD       lactated ringers infusion   Intravenous Continuous Leandrew Koyanagi, MD 125 mL/hr at 01/06/23 1028 Restarted at 01/06/23 1722   LORazepam (ATIVAN) injection 0.5 mg  0.5 mg Intravenous Q6H PRN Leandrew Koyanagi, MD       magnesium citrate solution 1 Bottle  1 Bottle Oral Once PRN Leandrew Koyanagi, MD       menthol-cetylpyridinium (CEPACOL) lozenge 3 mg  1 lozenge Oral PRN Leandrew Koyanagi, MD       Or   phenol (CHLORASEPTIC) mouth spray 1 spray  1 spray Mouth/Throat PRN Leandrew Koyanagi, MD       methocarbamol (ROBAXIN) tablet 500 mg  500 mg Oral Q6H PRN Leandrew Koyanagi, MD       Or   methocarbamol (ROBAXIN) 500 mg in dextrose 5 % 50 mL IVPB  500 mg Intravenous Q6H PRN Leandrew Koyanagi, MD       methocarbamol (ROBAXIN) tablet 500 mg  500 mg Oral TID Leandrew Koyanagi, MD   500 mg at 01/09/23 [id number]   metoprolol tartrate (LOPRESSOR) injection 5 mg  5 mg Intravenous Q4H PRN Amin, Jeanella Flattery, MD       mupirocin ointment (BACTROBAN) 2 % 1 Application  1 Application Nasal BID Nolon Nations, MD   1 Application at [id number]   ondansetron (ZOFRAN) tablet 4 mg  4 mg Oral Q6H PRN Leandrew Koyanagi, MD       Or   ondansetron [facility]) injection 4 mg  4 mg Intravenous Q6H PRN Leandrew Koyanagi, MD       oxyCODONE (Oxy IR/ROXICODONE) immediate release tablet 10-15 mg  10-15 mg Oral Q4H PRN Leandrew Koyanagi, MD       oxyCODONE (Oxy IR/ROXICODONE) immediate release tablet 5 mg  5 mg Oral Q4H PRN Leandrew Koyanagi, MD       oxyCODONE (Oxy IR/ROXICODONE) immediate release tablet 5-10 mg  5-10 mg Oral Q4H PRN Leandrew Koyanagi, MD       polyethylene glycol (MIRALAX / GLYCOLAX) packet 17 g  17 g Oral Daily PRN Leandrew Koyanagi, MD       protein supplement (ENSURE MAX) liquid  237 mL Oral Daily Leandrew Koyanagi, MD   237 mL at 01/09/23 0925   senna-docusate (Senokot-S)  tablet 1 tablet  1 tablet Oral QHS PRN Amin, Ankit Chirag, MD       senna-docusate (Senokot-S) tablet 2 tablet  2 tablet Oral QHS  Leandrew Koyanagi, MD   2 tablet at 01/06/23 2216   sodium chloride flush (NS) 0.9 % injection 3 mL  3 mL Intravenous Q12H Leandrew Koyanagi, MD   3 mL at 01/09/23 0926   sodium chloride flush (NS) 0.9 % injection 3 mL  3 mL Intravenous Q12H Leandrew Koyanagi, MD   3 mL at 01/09/23 0926   sodium chloride flush (NS) 0.9 % injection 3 mL  3 mL Intravenous PRN Leandrew Koyanagi, MD       sorbitol 70 % solution 30 mL  30 mL Oral Daily PRN Leandrew Koyanagi, MD       traZODone (DESYREL) tablet 50 mg  50 mg Oral QHS PRN Leandrew Koyanagi, MD         Discharge Medications: Please see discharge summary for a list of discharge medications.  Relevant Imaging Results:  Relevant Lab Results:   Additional Information    Jinger Neighbors, LCSW

## 2023-01-09 NOTE — TOC Initial Note (Signed)
Transition of Care [facility]) - Initial/Assessment Note    Patient Details  Name: Candice Ray MRN: [id number] Date of Birth: 1932-03-11  Transition of Care [facility]) CM/SW Contact:    Jinger Neighbors, LCSW Phone Number: 01/09/2023, 2:40 PM  Clinical Narrative:                  CSW met with pt at bedside to introduce self; however, pt unable to hear CSW. CSW contacted pt's son to discuss SNF recommendation; son in agreement. SNF work up complete.   Expected Discharge Plan: Skilled Nursing Facility Barriers to Discharge: Continued Medical Work up   Patient Goals and CMS Choice   CMS Medicare.gov Compare Post Acute Care list provided to:: Patient Represenative (must comment) (Pt's son, Mr. Eichorn) Choice offered to / list presented to : Adult Children      Expected Discharge Plan and Services       Living arrangements for the past 2 months: Single Family Home                                      Prior Living Arrangements/Services Living arrangements for the past 2 months: Single Family Home Lives with:: Adult Children Patient language and need for interpreter reviewed:: Yes Do you feel safe going back to the place where you live?: Yes      Need for Family Participation in Patient Care: Yes (Comment) Care giver support system in place?: Yes (comment)   Criminal Activity/Legal Involvement Pertinent to Current Situation/Hospitalization: No - Comment as needed  Activities of Daily Living   ADL Screening (condition at time of admission) Patient's cognitive ability adequate to safely complete daily activities?: No Is the patient deaf or have difficulty hearing?: Yes Does the patient have difficulty seeing, even when wearing glasses/contacts?: No Does the patient have difficulty concentrating, remembering, or making decisions?: Yes Patient able to express need for assistance with ADLs?: No Does the patient have difficulty dressing or bathing?: Yes Independently performs  ADLs?: No Communication: Needs assistance Is this a change from baseline?: Pre-admission baseline Dressing (OT): Needs assistance Grooming: Needs assistance Feeding: Independent Bathing: Needs assistance Toileting: Needs assistance In/Out Bed: Independent Walks in Home: Needs assistance Does the patient have difficulty walking or climbing stairs?: Yes Weakness of Legs: Both Weakness of Arms/Hands: None  Permission Sought/Granted                  Emotional Assessment Appearance:: Appears stated age Attitude/Demeanor/Rapport: Unable to Assess Affect (typically observed): Unable to Assess Orientation: : Oriented to Self      Admission diagnosis:  Hip fracture, left, closed, initial encounter ([place]) [S72.002A] Closed fracture of left femur, unspecified fracture morphology, unspecified portion of femur, initial encounter [facility]) [S72.92XA] Patient Active Problem List   Diagnosis Date Noted   Closed subcapital fracture of neck of left femur, initial encounter ([place]) 01/06/2023   Moderate protein-calorie malnutrition ([place]) 04/29/2022   Pressure injury of buttock, stage 1 04/23/2022   Schizophrenia ([place]) 04/19/2022   Aortic atherosclerosis ([place]) 01/06/2022   Pressure injury of skin 12/31/2021   Pelvic fracture/Lt Superior Pubis Ramus Fracture 12/30/2021   Dementia without behavioral disturbance ([place]) 12/30/2021   DM (diabetes mellitus), type 2 with neurological complications ([place]) [id number]   Bilateral sensorineural hearing loss 06/14/2017   RLQ abdominal pain 12/24/2013   Chronic diarrhea 12/24/2013   Bowel habit changes 12/24/2013   Hx of adenomatous colonic polyps  12/24/2013   PCP:  [facility]:   [facility] [place], [place] [address] [place] [id number] [place] [address] Phone: [phone] Fax: [phone]     Social Determinants of Health (SDOH) Social History: SDOH Screenings    Tobacco Use: Low Risk  (01/08/2023)   SDOH Interventions:     Readmission Risk Interventions     No data to display

## 2023-01-09 NOTE — Care Management Important Message (Signed)
Important Message  Patient Details  Name: Candice Ray MRN: [id number] Date of Birth: 05-Jun-1932   Medicare Important Message Given:  Yes     Candice Ray 01/09/2023, 3:16 PM

## 2023-01-09 NOTE — Telephone Encounter (Signed)
Hospital called; staph and MRSA positive

## 2023-01-10 LAB — CBC
HCT: 29.8 % — ABNORMAL LOW (ref 36.0–46.0)
Hemoglobin: 10 g/dL — ABNORMAL LOW (ref 12.0–15.0)
MCH: 28.7 pg (ref 26.0–34.0)
MCHC: 33.6 g/dL (ref 30.0–36.0)
MCV: 85.6 fL (ref 80.0–100.0)
Platelets: 174 10*3/uL (ref 150–400)
RBC: 3.48 MIL/uL — ABNORMAL LOW (ref 3.87–5.11)
RDW: 13.3 % (ref 11.5–15.5)
WBC: 6.6 10*3/uL (ref 4.0–10.5)
nRBC: 0 % (ref 0.0–0.2)

## 2023-01-10 LAB — GLUCOSE, CAPILLARY
Glucose-Capillary: 219 mg/dL — ABNORMAL HIGH (ref 70–99)
Glucose-Capillary: 212 mg/dL — ABNORMAL HIGH (ref 70–99)
Glucose-Capillary: 295 mg/dL — ABNORMAL HIGH (ref 70–99)
Glucose-Capillary: 184 mg/dL — ABNORMAL HIGH (ref 70–99)

## 2023-01-10 LAB — BASIC METABOLIC PANEL
Anion gap: 7 (ref 5–15)
BUN: 8 mg/dL (ref 8–23)
CO2: 28 mmol/L (ref 22–32)
Calcium: 8.3 mg/dL — ABNORMAL LOW (ref 8.9–10.3)
Chloride: 97 mmol/L — ABNORMAL LOW (ref 98–111)
Creatinine, Ser: 0.64 mg/dL (ref 0.44–1.00)
GFR, Estimated: >60 mL/min (ref >60)
Glucose, Bld: 223 mg/dL — ABNORMAL HIGH (ref 70–99)
Potassium: 3.8 mmol/L (ref 3.5–5.1)
Sodium: 132 mmol/L — ABNORMAL LOW (ref 135–145)

## 2023-01-10 LAB — MAGNESIUM: Magnesium: 1.5 mg/dL — ABNORMAL LOW (ref 1.7–2.4)

## 2023-01-10 MED ORDER — SODIUM CHLORIDE 0.9 % IV SOLN: INTRAVENOUS | Status: AC

## 2023-01-10 MED ORDER — POTASSIUM CHLORIDE 20 MEQ PO PACK
20.0000 meq | PACK | Freq: Once | ORAL | Status: AC
  Administered 2023-01-10: 20 meq via ORAL
  Filled 2023-01-10: qty 1

## 2023-01-10 MED ORDER — MAGNESIUM SULFATE 4 GM/100ML IV SOLN
4.0000 g | Freq: Once | INTRAVENOUS | Status: AC
  Administered 2023-01-10: 4 g via INTRAVENOUS
  Filled 2023-01-10: qty 100

## 2023-01-10 MED ORDER — INSULIN GLARGINE-YFGN 100 UNIT/ML ~~LOC~~ SOLN
3.0000 [IU] | Freq: Every day | SUBCUTANEOUS | Status: DC
  Administered 2023-01-10 – 2023-01-13 (×4): 3 [IU] via SUBCUTANEOUS
  Filled 2023-01-10 (×4): qty 0.03

## 2023-01-10 NOTE — Progress Notes (Signed)
Occupational Therapy Treatment Patient Details Name: Candice Ray MRN: [id number] DOB: May 11, 1932 Today's Date: 01/10/2023   History of present illness 89 y/o F transferred from [facility] to [facility] on 3/8 for L hip pain, found to have a L femoral neck fracture. Pt now s/p L hip hemi-arthroplasty on 3/8, WBAT without hip precautions. PMHx: dementia/schizophrenia, DM, hearing loss.   OT comments  PT with patient upon entry. COTA assisted PT with getting patient to EOB  and once on EOB patient was min guard assist for balance. +2 min/mod assist to transfer to recliner with HHA. Patient instructed in grooming tasks while seated on EOB. Patient demonstrating limited progress and would benefit from continued OT services to address standing ADLS/grooming and functional transfers. Acute OT to continue to follow.    Recommendations for follow up therapy are one component of a multi-disciplinary discharge planning process, led by the attending physician.  Recommendations may be updated based on patient status, additional functional criteria and insurance authorization.    Follow Up Recommendations  Skilled nursing-short term rehab (<3 hours/day)     Assistance Recommended at Discharge Frequent or constant Supervision/Assistance  Patient can return home with the following  A lot of help with walking and/or transfers;A lot of help with bathing/dressing/bathroom;Assistance with cooking/housework;Assistance with feeding;Direct supervision/assist for medications management;Assist for transportation;Direct supervision/assist for financial management;Help with stairs or ramp for entrance   Equipment Recommendations  Other (comment) (defer)    Recommendations for Other Services      Precautions / Restrictions Precautions Precautions: Fall Restrictions Weight Bearing Restrictions: No LLE Weight Bearing: Weight bearing as tolerated       Mobility Bed Mobility Overal bed mobility: Needs  Assistance Bed Mobility: Supine to Sit     Supine to sit: Mod assist, +2 for physical assistance     General bed mobility comments: Patietn required mod assist x2 due to assistance eneded with trunk and BLE management.    Transfers Overall transfer level: Needs assistance Equipment used: 2 person hand held assist Transfers: Sit to/from Stand, Bed to chair/wheelchair/BSC Sit to Stand: Mod assist, +2 physical assistance     Step pivot transfers: Min assist, +2 physical assistance     General transfer comment: +2 to power up and for safety with transfers     Balance Overall balance assessment: Needs assistance Sitting-balance support: Feet supported Sitting balance-Leahy Scale: Fair     Standing balance support: Bilateral upper extremity supported Standing balance-Leahy Scale: Poor Standing balance comment: requires assist and reliant on UE support for standing                           ADL either performed or assessed with clinical judgement   ADL Overall ADL's : Needs assistance/impaired     Grooming: Wash/dry hands;Wash/dry face;Brushing hair;Supervision/safety;Sitting               Lower Body Dressing: Maximal assistance;Sitting/lateral leans Lower Body Dressing Details (indicate cue type and reason): Patient required max assist to doff and donn socks                    Extremity/Trunk Assessment              Vision       Perception     Praxis      Cognition Arousal/Alertness: Awake/alert Behavior During Therapy: Restless, Agitated Overall Cognitive Status: History of cognitive impairments - at baseline  General Comments: Agitation initially but was cooperative once in recliner        Exercises      Shoulder Instructions       General Comments      Pertinent Vitals/ Pain       Pain Assessment Pain Assessment: Faces Faces Pain Scale: Hurts little more Pain Location:  LLE Pain Descriptors / Indicators: Guarding, Grimacing, Sore Pain Intervention(s): Limited activity within patient's tolerance, Monitored during session, Repositioned  Home Living                                          Prior Functioning/Environment              Frequency  Min 2X/week        Progress Toward Goals  OT Goals(current goals can now be found in the care plan section)  Progress towards OT goals: Progressing toward goals  Acute Rehab OT Goals Patient Stated Goal: none stated OT Goal Formulation: With patient Time For Goal Achievement: 01/21/23 Potential to Achieve Goals: Good ADL Goals Pt Will Perform Grooming: with supervision Pt Will Perform Lower Body Dressing: with mod assist;sit to/from stand Pt Will Transfer to Toilet: with mod assist;stand pivot transfer;bedside commode Additional ADL Goal #1: Pt will tolerate 5x of standing ADL task with min A overall  Plan Discharge plan remains appropriate    Co-evaluation                 AM-PAC OT "6 Clicks" Daily Activity     Outcome Measure   Help from another person eating meals?: None Help from another person taking care of personal grooming?: A Little Help from another person toileting, which includes using toliet, bedpan, or urinal?: A Lot Help from another person bathing (including washing, rinsing, drying)?: A Lot Help from another person to put on and taking off regular upper body clothing?: A Little Help from another person to put on and taking off regular lower body clothing?: A Lot 6 Click Score: 16    End of Session    OT Visit Diagnosis: Unsteadiness on feet (R26.81);Other abnormalities of gait and mobility (R26.89);Muscle weakness (generalized) (M62.81);History of falling (Z91.81);Pain Pain - Right/Left: Left Pain - part of body: Leg   Activity Tolerance Patient tolerated treatment well   Patient Left in chair;with call bell/phone within reach;with chair alarm  set   Nurse Communication Mobility status        Time: [id number] OT Time Calculation (min): 26 min  Charges: OT General Charges $OT Visit: 1 Visit OT Treatments $Self Care/Home Management : 8-22 mins  Lodema Hong, [place]  Office [place] 01/10/2023, 3:08 PM

## 2023-01-10 NOTE — Progress Notes (Signed)
PROGRESS NOTE    Candice Ray  [id number] DOB: 01/12/1932 DOA: 01/06/2023 PCP: Alanson Puls [facility]   Brief Narrative:  89 y.o. female with past medical history significant for hearing loss, advanced dementia, DM2 , history of schizophrenia/psychosis and history of recurrent falls who presents to the ED with sudden onset of left hip pain on 01/06/2023.  Left hip x-ray showed subcapital left femoral neck fracture.  Patient underwent left hip hemiarthroplasty on 01/06/2023.   PT OT recommending SNF.  Currently pending placement   Assessment & Plan:  Principal Problem:   Closed subcapital fracture of neck of left femur, initial encounter ([place]) Active Problems:   DM (diabetes mellitus), type 2 with neurological complications ([place])   Dementia without behavioral disturbance (HCC)   Bilateral sensorineural hearing loss    Left hip fracture status post hemiarthroplasty 3/8 - Postop management by orthopedic surgery including pain management, DVT prophylaxis, weightbearing precautions, dressing changes. PT/OT recommending SNF     Dementia/schizophrenia. - Supportive care   DM2, insulin-dependent.  Poorly controlled, hyperglycemia -A1c 8.9.  On sliding scale and Accu-Cheks.  Add Semglee 30 units daily due to hyperglycemia   Social/ethics -Has advanced dementia and hearing loss.  Patient is DNR/DNI   Dysphagia -Dysphagia 3 diet   Elevated BP -Related to pain.  As needed   Acute kidney injury Baseline creatinine 0.7, peaked at 1.02 improved with fluids   Abdominal distention secondary to ileus -Abdominal x-ray revealed postoperative ileus.  Out of bed to chair  Hypomagnesemia/hypokalemia-repletion     DVT prophylaxis: Subcutaneous Lovenox Code Status: DNR resuscitate Family Communication:  Disposition Plan: This will depend on hospital course  Status is: Inpatient Remains inpatient appropriate because: Pending placement    Subjective: Eating her breakfast, no  complaints.  Very hard of hearing   Examination:  Constitutional: Not in acute distress.  Very hard of hearing Respiratory: Clear to auscultation bilaterally Cardiovascular: Normal sinus rhythm, no rubs Abdomen: Nontender nondistended good bowel sounds Musculoskeletal: No edema noted Skin: Surgical dressing in place over her left hip Neurologic: CN 2-12 grossly intact.  And nonfocal Psychiatric: Normal judgment and insight. Alert and oriented x 3. Normal mood.  Objective: Vitals:   01/09/23 1618 01/09/23 1952 01/10/23 0424 01/10/23 0725  BP: 138/65 118/71 139/69 125/83  Pulse: (!) 107 (!) 107 (!) 110 (!) 106  Resp: '16 18 14 18  '$ Temp: (!) 97.5 F (36.4 C) 98.9 F (37.2 C)  98.2 F (36.8 C)  TempSrc:  Oral  Oral  SpO2: 95% 93%  95%  Weight:        Intake/Output Summary (Last 24 hours) at 01/10/2023 1059 Last data filed at 01/10/2023 1008 Gross per 24 hour  Intake 1136 ml  Output 2700 ml  Net -1564 ml   Filed Weights   01/09/23 0600  Weight: 61 kg     Data Reviewed:   CBC: Recent Labs  Lab 01/06/23 1027 01/06/23 2226 01/07/23 0548 01/08/23 0436 01/10/23 0220  WBC 5.2 10.3 8.6 7.7 6.6  NEUTROABS 3.8  --   --  5.7  --   HGB 13.3 12.2 11.0* 10.7* 10.0*  HCT 40.9 36.3 32.7* 31.5* 29.8*  MCV 88.1 87.1 87.0 88.5 85.6  PLT 204 185 185 146* [id number]   Basic Metabolic Panel: Recent Labs  Lab 01/06/23 1027 01/06/23 2226 01/07/23 0506 01/07/23 1812 01/08/23 0436 01/10/23 0220  NA 132*  --  132* 131* 134* 132*  K 4.3  --  4.6 3.7 3.7 3.8  CL  98  --  98 100 101 97*  CO2 26  --  20* 23 21* 28  GLUCOSE 258*  --  203* 163* 135* 223*  BUN 28*  --  '20 20 15 8  '$ CREATININE 1.01* 0.89 1.02* 1.02* 0.79 0.64  CALCIUM 8.9  --  8.7* 8.5* 8.5* 8.3*  MG  --   --   --   --  1.5* 1.5*  PHOS  --   --   --  3.0  --   --    GFR: CrCl cannot be calculated (Unknown ideal weight.). Liver Function Tests: Recent Labs  Lab 01/07/23 1812  ALBUMIN 3.0*   No results for input(s):  "LIPASE", "AMYLASE" in the last 168 hours. No results for input(s): "AMMONIA" in the last 168 hours. Coagulation Profile: Recent Labs  Lab 01/06/23 1027  INR 1.1   Cardiac Enzymes: No results for input(s): "CKTOTAL", "CKMB", "CKMBINDEX", "TROPONINI" in the last 168 hours. BNP (last 3 results) No results for input(s): "PROBNP" in the last 8760 hours. HbA1C: No results for input(s): "HGBA1C" in the last 72 hours. CBG: Recent Labs  Lab 01/09/23 0823 01/09/23 1237 01/09/23 1620 01/09/23 2014 01/10/23 0727  GLUCAP 198* 235* 277* 216* 219*   Lipid Profile: No results for input(s): "CHOL", "HDL", "LDLCALC", "TRIG", "CHOLHDL", "LDLDIRECT" in the last 72 hours. Thyroid Function Tests: No results for input(s): "TSH", "T4TOTAL", "FREET4", "T3FREE", "THYROIDAB" in the last 72 hours. Anemia Panel: No results for input(s): "VITAMINB12", "FOLATE", "FERRITIN", "TIBC", "IRON", "RETICCTPCT" in the last 72 hours. Sepsis Labs: No results for input(s): "PROCALCITON", "LATICACIDVEN" in the last 168 hours.  Recent Results (from the past 240 hour(s))  Surgical pcr screen     Status: Abnormal   Collection Time: 01/06/23  4:47 PM   Specimen: Nasal Mucosa; Nasal Swab  Result Value Ref Range Status   MRSA, PCR POSITIVE (A) NEGATIVE Final    Comment: RESULT CALLED TO, READ BACK BY AND VERIFIED WITH: EMAILED Charolett Bumpers [phone] @ 2032 FH    Staphylococcus aureus POSITIVE (A) NEGATIVE Final    Comment: (NOTE) The Xpert SA Assay (FDA approved for NASAL specimens in patients 63 years of age and older), is one component of a comprehensive surveillance program. It is not intended to diagnose infection nor to guide or monitor treatment. Performed at [facility], [place] [address] [place], [place] [address]          Radiology Studies: No results found.      Scheduled Meds:  docusate sodium  100 mg Oral BID   enoxaparin (LOVENOX) injection  40 mg Subcutaneous Q24H   insulin aspart  0-5  Units Subcutaneous QHS   insulin aspart  0-6 Units Subcutaneous TID WC   insulin aspart  3 Units Subcutaneous TID WC   insulin glargine-yfgn  3 Units Subcutaneous Daily   methocarbamol  500 mg Oral TID   mupirocin ointment  1 Application Nasal BID   Ensure Max Protein  237 mL Oral Daily   senna-docusate  2 tablet Oral QHS   sodium chloride flush  3 mL Intravenous Q12H   sodium chloride flush  3 mL Intravenous Q12H   Continuous Infusions:  sodium chloride     sodium chloride 75 mL/hr at 01/10/23 0853   lactated ringers 125 mL/hr at 01/06/23 1028   methocarbamol (ROBAXIN) IV       LOS: 4 days   Time spent= 35 mins    Iyani Dresner Arsenio Loader, MD [facility]  If  7PM-7AM, please contact night-coverage  01/10/2023, 10:59 AM

## 2023-01-10 NOTE — Progress Notes (Signed)
Physical Therapy Treatment Patient Details Name: Candice Ray MRN: [id number] DOB: 06-13-1932 Today's Date: 01/10/2023   History of Present Illness 89 y/o F transferred from [facility] to [facility] on 3/8 for L hip pain, found to have a L femoral neck fracture. Pt now s/p L hip hemi-arthroplasty on 3/8, WBAT without hip precautions. PMHx: dementia/schizophrenia, DM, hearing loss.    PT Comments    Pt sleeping upon PT arrival to room, wakes easily. Pt initially agreeable on transfer OOB, once PT began assisting her pt vocalized loudly and stated "I can't, I got operated on!". Pt benefitted from +2 assist to reach EOB, once EOB pt tolerated transfer to recliner well overall requiring min-mod +2 assist at this time. Pt reporting significant L hip and thigh pain whenever LLE is moved through ROM. PT recommendations remain unchanged, will continue to follow.    Recommendations for follow up therapy are one component of a multi-disciplinary discharge planning process, led by the attending physician.  Recommendations may be updated based on patient status, additional functional criteria and insurance authorization.  Follow Up Recommendations  Skilled nursing-short term rehab (<3 hours/day) Can patient physically be transported by private vehicle: No   Assistance Recommended at Discharge Frequent or constant Supervision/Assistance  Patient can return home with the following A lot of help with walking and/or transfers;Assistance with cooking/housework;Direct supervision/assist for medications management;Assist for transportation;Help with stairs or ramp for entrance   Equipment Recommendations  None recommended by PT    Recommendations for Other Services       Precautions / Restrictions Precautions Precautions: Fall Restrictions Weight Bearing Restrictions: No LLE Weight Bearing: Weight bearing as tolerated     Mobility  Bed Mobility Overal bed mobility: Needs Assistance Bed Mobility:  Supine to Sit     Supine to sit: Mod assist, +2 for physical assistance     General bed mobility comments: mod +2 for trunk and LE management, scooting to EOB. PT and COTA using bed pad to assist, as PT attempted to get pt to EOB +1 and PT unsuccessful given significant physical support needed.    Transfers Overall transfer level: Needs assistance Equipment used: 2 person hand held assist Transfers: Sit to/from Stand, Bed to chair/wheelchair/BSC Sit to Stand: Mod assist, +2 physical assistance   Step pivot transfers: Min assist, +2 physical assistance       General transfer comment: assist for power up via bilat HHA, rise, steady, and pivotal steps towards recliner. Pt initially vocalizing in pain, but once stepping pt tolerates well.    Ambulation/Gait               General Gait Details: unable as pt limited by cognition/HOH   Stairs             Wheelchair Mobility    Modified Rankin (Stroke Patients Only)       Balance Overall balance assessment: Needs assistance Sitting-balance support: Feet supported Sitting balance-Leahy Scale: Fair     Standing balance support: Bilateral upper extremity supported Standing balance-Leahy Scale: Poor Standing balance comment: requires assist and reliant on UE support for standing                            Cognition Arousal/Alertness: Awake/alert Behavior During Therapy: Restless, Agitated Overall Cognitive Status: History of cognitive impairments - at baseline  General Comments: pt with periods of agitation with mobility, grabbing at staff suspect mostly due to anxiety. Pt with history of dementia. very HOH, which limited pt understanding, benefits from PT speaking loudly into her ear        Exercises General Exercises - Lower Extremity Long Arc Quad: AAROM, Left, Seated (x7)    General Comments        Pertinent Vitals/Pain Pain Assessment Pain  Assessment: Faces Faces Pain Scale: Hurts little more Pain Location: LLE Pain Descriptors / Indicators: Guarding, Grimacing, Sore Pain Intervention(s): Limited activity within patient's tolerance, Monitored during session, Repositioned    Home Living                          Prior Function            PT Goals (current goals can now be found in the care plan section) Acute Rehab PT Goals PT Goal Formulation: Patient unable to participate in goal setting Time For Goal Achievement: 01/21/23 Potential to Achieve Goals: Fair Progress towards PT goals: Progressing toward goals    Frequency    Min 3X/week      PT Plan Current plan remains appropriate    Co-evaluation PT/OT/SLP Co-Evaluation/Treatment:  (dovetail with OT)            AM-PAC PT "6 Clicks" Mobility   Outcome Measure  Help needed turning from your back to your side while in a flat bed without using bedrails?: A Lot Help needed moving from lying on your back to sitting on the side of a flat bed without using bedrails?: A Lot Help needed moving to and from a bed to a chair (including a wheelchair)?: A Lot Help needed standing up from a chair using your arms (e.g., wheelchair or bedside chair)?: A Lot Help needed to walk in hospital room?: Total Help needed climbing 3-5 steps with a railing? : Total 6 Click Score: 10    End of Session   Activity Tolerance: Patient limited by pain Patient left: in chair;with chair alarm set;with call bell/phone within reach Nurse Communication: Mobility status;Other (comment) (recommend stedy for back to bed) PT Visit Diagnosis: Muscle weakness (generalized) (M62.81);History of falling (Z91.81);Difficulty in walking, not elsewhere classified (R26.2)     Time: [id number] PT Time Calculation (min) (ACUTE ONLY): 24 min  Charges:  $Therapeutic Activity: 8-22 mins                     [name], PT DPT Acute Rehabilitation Services Pager [phone]  Office  [phone]    [name] 01/10/2023, 2:48 PM

## 2023-01-10 NOTE — TOC Progression Note (Signed)
Transition of Care [facility]) - Progression Note    Patient Details  Name: Candice Ray MRN: [id number] Date of Birth: [birth year]  Transition of Care [facility]) CM/SW Contact  Jinger Neighbors,  Phone Number: 01/10/2023, 10:04 AM  Clinical Narrative:     CSW followed up on bed offers and made contact with pt's son. Pt's son wants to go with [facility]. CSW messaged [employer] to request auth.  Expected Discharge Plan: [place] Barriers to Discharge: Continued Medical Work up  Expected Discharge Plan and Services       Living arrangements for the past 2 months: Single Family Home                                       Social Determinants of Health (SDOH) Interventions SDOH Screenings   Tobacco Use: Low Risk  (01/08/2023)    Readmission Risk Interventions     No data to display

## 2023-01-11 LAB — CBC
HCT: 30.6 % — ABNORMAL LOW (ref 36.0–46.0)
Hemoglobin: 10.4 g/dL — ABNORMAL LOW (ref 12.0–15.0)
MCH: 29.3 pg (ref 26.0–34.0)
MCHC: 34 g/dL (ref 30.0–36.0)
MCV: 86.2 fL (ref 80.0–100.0)
Platelets: 212 10*3/uL (ref 150–400)
RBC: 3.55 MIL/uL — ABNORMAL LOW (ref 3.87–5.11)
RDW: 13.2 % (ref 11.5–15.5)
WBC: 5.5 10*3/uL (ref 4.0–10.5)
nRBC: 0 % (ref 0.0–0.2)

## 2023-01-11 LAB — MAGNESIUM: Magnesium: 1.9 mg/dL (ref 1.7–2.4)

## 2023-01-11 LAB — BASIC METABOLIC PANEL
Anion gap: 9 (ref 5–15)
BUN: 14 mg/dL (ref 8–23)
CO2: 26 mmol/L (ref 22–32)
Calcium: 8.3 mg/dL — ABNORMAL LOW (ref 8.9–10.3)
Chloride: 96 mmol/L — ABNORMAL LOW (ref 98–111)
Creatinine, Ser: 0.58 mg/dL (ref 0.44–1.00)
GFR, Estimated: >60 mL/min (ref >60)
Glucose, Bld: 230 mg/dL — ABNORMAL HIGH (ref 70–99)
Potassium: 4.4 mmol/L (ref 3.5–5.1)
Sodium: 131 mmol/L — ABNORMAL LOW (ref 135–145)

## 2023-01-11 LAB — GLUCOSE, CAPILLARY
Glucose-Capillary: 217 mg/dL — ABNORMAL HIGH (ref 70–99)
Glucose-Capillary: 285 mg/dL — ABNORMAL HIGH (ref 70–99)
Glucose-Capillary: 205 mg/dL — ABNORMAL HIGH (ref 70–99)
Glucose-Capillary: 244 mg/dL — ABNORMAL HIGH (ref 70–99)

## 2023-01-11 NOTE — Progress Notes (Signed)
PROGRESS NOTE  Candice Ray [id number] DOB: 01/29/1932 DOA: 01/06/2023 PCP: Alanson Puls, [facility]   LOS: 5 days   Brief Narrative / Interim history: 89 y.o. female with past medical history significant for hearing loss, advanced dementia, DM2 , history of schizophrenia/psychosis and history of recurrent falls who presents to the ED with sudden onset of left hip pain on 01/06/2023.  Left hip x-ray showed subcapital left femoral neck fracture.  Patient underwent left hip hemiarthroplasty on 01/06/2023.   PT OT recommending SNF.  Currently pending placement   Subjective / 24h Interval events: Underlying dementia. Very HOH  Assesement and Plan: Principal Problem:   Closed subcapital fracture of neck of left femur, initial encounter ([place]) Active Problems:   DM (diabetes mellitus), type 2 with neurological complications ([place])   Dementia without behavioral disturbance (HCC)   Bilateral sensorineural hearing loss   Principal problem Left hip fracture status post hemiarthroplasty 3/8 -patient was admitted to the hospital with a left hip fracture.  She is status post prosthetic replacement on 3/8 by Dr. Erlinda Hong.  DVT prophylaxis with Lovenox, pain control with Norco.  Needs SNF   Active problems Dementia/schizophrenia - Supportive care, stable and at baseline DM2, with hyperglycemia - A1c 8.9.  Resume home occasions medications Social/ethics - Has advanced dementia and hearing loss.  Patient is DNR/DNI Dysphagia - Dysphagia 3 diet Elevated BP - Related to pain.  Blood pressure better with pain control Acute kidney injury - Baseline creatinine 0.7, peaked at 1.02 improved with fluids Abdominal distention secondary to ileus -Abdominal x-ray revealed postoperative ileus.  Out of bed to chair. Resolved Hypomagnesemia/hypokalemia -replace as indicated  Scheduled Meds:  docusate sodium  100 mg Oral BID   enoxaparin (LOVENOX) injection  40 mg Subcutaneous Q24H   insulin aspart  0-5 Units  Subcutaneous QHS   insulin aspart  0-6 Units Subcutaneous TID WC   insulin aspart  3 Units Subcutaneous TID WC   insulin glargine-yfgn  3 Units Subcutaneous Daily   methocarbamol  500 mg Oral TID   mupirocin ointment  1 Application Nasal BID   Ensure Max Protein  237 mL Oral Daily   senna-docusate  2 tablet Oral QHS   sodium chloride flush  3 mL Intravenous Q12H   sodium chloride flush  3 mL Intravenous Q12H   Continuous Infusions:  sodium chloride     lactated ringers 125 mL/hr at 01/06/23 1028   methocarbamol (ROBAXIN) IV     PRN Meds:.sodium chloride, acetaminophen, bisacodyl, fentaNYL (SUBLIMAZE) injection, guaiFENesin, hydrALAZINE, HYDROmorphone (DILAUDID) injection, ipratropium-albuterol, LORazepam, magnesium citrate, menthol-cetylpyridinium **OR** phenol, methocarbamol **OR** methocarbamol (ROBAXIN) IV, metoprolol tartrate, ondansetron **OR** ondansetron (ZOFRAN) IV, oxyCODONE, oxyCODONE, oxyCODONE, polyethylene glycol, senna-docusate, sodium chloride flush, sorbitol, traZODone  Current Outpatient Medications  Medication Instructions   acetaminophen (TYLENOL) 500 mg, Oral, Every 6 hours PRN   aspirin EC 325 mg, Oral, Daily   CALCIUM PO 1 tablet, Oral, Daily   docusate sodium (COLACE) 100 mg, Oral, 2 times daily PRN   Empagliflozin-metFORMIN HCl (SYNJARDY) 12.02-999 MG TABS 1 tablet, Oral, 2 times daily   enoxaparin (LOVENOX) 40 mg, Subcutaneous, Daily   HYDROcodone-acetaminophen (NORCO) 5-325 MG tablet 1 tablet, Oral, Every 6 hours PRN   Januvia 25 mg, Oral, Daily   NON FORMULARY Diet: Dysphagia 3 (puree) diet with thin liquids   ondansetron (ZOFRAN) 4 mg, Oral, Every 6 hours PRN   polyethylene glycol (MIRALAX / GLYCOLAX) 17 g, Oral, Daily PRN    Diet Orders (From admission, onward)  Start     Ordered   01/06/23 2101  Diet Carb Modified Fluid consistency: Thin; Room service appropriate? Yes  Diet effective now       Question Answer Comment  Calorie Level Medium  1600-2000   Fluid consistency: Thin   Room service appropriate? Yes      01/06/23 2100            DVT prophylaxis: enoxaparin (LOVENOX) injection 40 mg Start: 01/07/23 1600 SCDs Start: 01/06/23 2101 Place TED hose Start: 01/06/23 2101 SCDs Start: 01/06/23 1300 Place TED hose Start: 01/06/23 1300   Lab Results  Component Value Date   PLT 212 01/11/2023      Code Status: DNR  Family Communication: no family at bedside   Status is: Inpatient  Remains inpatient appropriate because: awaiting placement   Level of care: Med-Surg  Consultants:  Orthopedic surgery   Objective: Vitals:   01/10/23 1605 01/10/23 2039 01/11/23 0343 01/11/23 0743  BP: 123/78 126/79 126/87 111/69  Pulse: 98 (!) 109 (!) 110 (!) 113  Resp: '18 17 18 18  '$ Temp: 98.1 F (36.7 C) 99.4 F (37.4 C) 97.9 F (36.6 C) 98 F (36.7 C)  TempSrc: Oral Oral Oral Oral  SpO2: 96% 98% 96% 93%  Weight:        Intake/Output Summary (Last 24 hours) at 01/11/2023 1311 Last data filed at 01/11/2023 1138 Gross per 24 hour  Intake 724.13 ml  Output 2050 ml  Net -1325.87 ml   Wt Readings from Last 3 Encounters:  01/09/23 61 kg  06/02/22 49.6 kg  05/24/22 49.4 kg    Examination:  Constitutional: NAD Eyes: no scleral icterus ENMT: Mucous membranes are moist.  Neck: normal, supple Respiratory: clear to auscultation bilaterally, no wheezing, no crackles. Normal respiratory effort. No accessory muscle use.  Cardiovascular: Regular rate and rhythm, no murmurs / rubs / gallops. No LE edema.  Abdomen: non distended, no tenderness. Bowel sounds positive.  Musculoskeletal: no clubbing / cyanosis.    Data Reviewed: I have independently reviewed following labs and imaging studies   CBC Recent Labs  Lab 01/06/23 1027 01/06/23 2226 01/07/23 0548 01/08/23 0436 01/10/23 0220 01/11/23 0231  WBC 5.2 10.3 8.6 7.7 6.6 5.5  HGB 13.3 12.2 11.0* 10.7* 10.0* 10.4*  HCT 40.9 36.3 32.7* 31.5* 29.8* 30.6*  PLT  204 185 185 146* 174 212  MCV 88.1 87.1 87.0 88.5 85.6 86.2  MCH 28.7 29.3 29.3 30.1 28.7 29.3  MCHC 32.5 33.6 33.6 34.0 33.6 34.0  RDW 13.6 13.4 13.5 13.3 13.3 13.2  LYMPHSABS 0.9  --   --  1.0  --   --   MONOABS 0.5  --   --  0.8  --   --   EOSABS 0.1  --   --  0.2  --   --   BASOSABS 0.0  --   --  0.0  --   --     Recent Labs  Lab 01/06/23 1027 01/06/23 1256 01/06/23 2226 01/07/23 0506 01/07/23 1812 01/08/23 0436 01/10/23 0220 01/11/23 0231  NA 132*  --   --  132* 131* 134* 132* 131*  K 4.3  --   --  4.6 3.7 3.7 3.8 4.4  CL 98  --   --  98 100 101 97* 96*  CO2 26  --   --  20* 23 21* 28 26  GLUCOSE 258*  --   --  203* 163* 135* 223* 230*  BUN 28*  --   --  $'20 20 15 8 14  'M$ CREATININE 1.01*  --    < > 1.02* 1.02* 0.79 0.64 0.58  CALCIUM 8.9  --   --  8.7* 8.5* 8.5* 8.3* 8.3*  ALBUMIN  --   --   --   --  3.0*  --   --   --   MG  --   --   --   --   --  1.5* 1.5* 1.9  INR 1.1  --   --   --   --   --   --   --   HGBA1C  --  9.7*  --   --   --   --   --   --    < > = values in this interval not displayed.    ------------------------------------------------------------------------------------------------------------------ No results for input(s): "CHOL", "HDL", "LDLCALC", "TRIG", "CHOLHDL", "LDLDIRECT" in the last 72 hours.  Lab Results  Component Value Date   HGBA1C 9.7 (H) 01/06/2023   ------------------------------------------------------------------------------------------------------------------ No results for input(s): "TSH", "T4TOTAL", "T3FREE", "THYROIDAB" in the last 72 hours.  Invalid input(s): "FREET3"  Cardiac Enzymes No results for input(s): "CKMB", "TROPONINI", "MYOGLOBIN" in the last 168 hours.  Invalid input(s): "CK" ------------------------------------------------------------------------------------------------------------------ No results found for: "BNP"  CBG: Recent Labs  Lab 01/10/23 1159 01/10/23 1542 01/10/23 2042 01/11/23 0734  01/11/23 1135  GLUCAP 212* 295* 184* 217* 285*    Recent Results (from the past 240 hour(s))  Surgical pcr screen     Status: Abnormal   Collection Time: 01/06/23  4:47 PM   Specimen: Nasal Mucosa; Nasal Swab  Result Value Ref Range Status   MRSA, PCR POSITIVE (A) NEGATIVE Final    Comment: RESULT CALLED TO, READ BACK BY AND VERIFIED WITH: EMAILED Charolett Bumpers [phone] @ 2032 FH    Staphylococcus aureus POSITIVE (A) NEGATIVE Final    Comment: (NOTE) The Xpert SA Assay (FDA approved for NASAL specimens in patients 96 years of age and older), is one component of a comprehensive surveillance program. It is not intended to diagnose infection nor to guide or monitor treatment. Performed at [facility], [place] [address], [place], [place] [address]      Radiology Studies: No results found.   Marzetta Board, MD, PhD [facility]  Between 7 am - 7 pm I am available, please contact me via Amion (for emergencies) or Securechat (non urgent messages)  Between 7 pm - 7 am I am not available, please contact night coverage MD/APP via Amion

## 2023-01-11 NOTE — Inpatient Diabetes Management (Addendum)
Inpatient Diabetes Program Recommendations  AACE/ADA: New Consensus Statement on Inpatient Glycemic Control (2015)  Target Ranges:  Prepandial:   less than 140 mg/dL      Peak postprandial:   less than 180 mg/dL (1-2 hours)      Critically ill patients:  140 - 180 mg/dL   Lab Results  Component Value Date   GLUCAP 217 (H) 01/11/2023   HGBA1C 9.7 (H) 01/06/2023    Review of Glycemic Control  Latest Reference Range & Units 01/10/23 07:27 01/10/23 11:59 01/10/23 15:42 01/10/23 20:42 01/11/23 07:34  Glucose-Capillary 70 - 99 mg/dL 219 (H) 212 (H) 295 (H) 184 (H) 217 (H)  (H): Data is abnormally high  Diabetes history: DM2 Outpatient Diabetes medications: Synjardy 12.02-999 mg BID, Januvua 25 mg QD Current orders for Inpatient glycemic control: Semglee 3 units QD, Novolog 0-6 units TID and 0-5 units QHS, Novolog 3 units TID with meals  Inpatient Diabetes Program Recommendations:    Might consider:  Semglee 3 units BID (0.1 units x 61 kg/24 hrs).  Will continue to follow while inpatient.  Thank you, Reche Dixon, MSN, [place] Diabetes Coordinator Inpatient Diabetes Program [phone] (team pager from 8a-5p)

## 2023-01-12 LAB — GLUCOSE, CAPILLARY
Glucose-Capillary: 248 mg/dL — ABNORMAL HIGH (ref 70–99)
Glucose-Capillary: 332 mg/dL — ABNORMAL HIGH (ref 70–99)
Glucose-Capillary: 230 mg/dL — ABNORMAL HIGH (ref 70–99)
Glucose-Capillary: 237 mg/dL — ABNORMAL HIGH (ref 70–99)
Glucose-Capillary: 170 mg/dL — ABNORMAL HIGH (ref 70–99)

## 2023-01-12 LAB — CBC
HCT: 32.3 % — ABNORMAL LOW (ref 36.0–46.0)
Hemoglobin: 10.7 g/dL — ABNORMAL LOW (ref 12.0–15.0)
MCH: 28.5 pg (ref 26.0–34.0)
MCHC: 33.1 g/dL (ref 30.0–36.0)
MCV: 85.9 fL (ref 80.0–100.0)
Platelets: 257 10*3/uL (ref 150–400)
RBC: 3.76 MIL/uL — ABNORMAL LOW (ref 3.87–5.11)
RDW: 13.3 % (ref 11.5–15.5)
WBC: 5.3 10*3/uL (ref 4.0–10.5)
nRBC: 0 % (ref 0.0–0.2)

## 2023-01-12 LAB — BASIC METABOLIC PANEL
Anion gap: 10 (ref 5–15)
BUN: 13 mg/dL (ref 8–23)
CO2: 27 mmol/L (ref 22–32)
Calcium: 8.7 mg/dL — ABNORMAL LOW (ref 8.9–10.3)
Chloride: 96 mmol/L — ABNORMAL LOW (ref 98–111)
Creatinine, Ser: 0.67 mg/dL (ref 0.44–1.00)
GFR, Estimated: >60 mL/min (ref >60)
Glucose, Bld: 210 mg/dL — ABNORMAL HIGH (ref 70–99)
Potassium: 4 mmol/L (ref 3.5–5.1)
Sodium: 133 mmol/L — ABNORMAL LOW (ref 135–145)

## 2023-01-12 LAB — RESP PANEL BY RT-PCR (RSV, FLU A&B, COVID)  RVPGX2
Influenza A by PCR: NEGATIVE
Influenza B by PCR: NEGATIVE
Resp Syncytial Virus by PCR: NEGATIVE
SARS Coronavirus 2 by RT PCR: NEGATIVE

## 2023-01-12 LAB — MAGNESIUM: Magnesium: 1.7 mg/dL (ref 1.7–2.4)

## 2023-01-12 NOTE — Progress Notes (Signed)
Physical Therapy Treatment Patient Details Name: Candice Ray MRN: [id number] DOB: Feb 12, 1932 Today's Date: 01/12/2023   History of Present Illness 89 y/o F transferred from [facility] to [facility] on 3/8 for L hip pain, found to have a L femoral neck fracture. Pt now s/p L hip hemi-arthroplasty on 3/8, WBAT without hip precautions. PMHx: dementia/schizophrenia, DM, hearing loss.    PT Comments    Pt was up to side of bed with help today and motivated to try to stand and take steps.  Was willing to do ROM to knees and ankles afterward, and was helpful and demonstrating understanding to return to bed with assist to PT.  Her overall picture is quite good today, with improvements to all aspects of mobility.  Will continue to recommend her to SNF for completion of gait and transfers with less assistance, and encourage staff to use a bit more volume with this being helpful as well as good physical cues to get her to understand the therapy commands.  Follow goals for acute PT as are outlined on POC.  Recommendations for follow up therapy are one component of a multi-disciplinary discharge planning process, led by the attending physician.  Recommendations may be updated based on patient status, additional functional criteria and insurance authorization.  Follow Up Recommendations  Skilled nursing-short term rehab (<3 hours/day) Can patient physically be transported by private vehicle: No   Assistance Recommended at Discharge Frequent or constant Supervision/Assistance  Patient can return home with the following Assistance with cooking/housework;Direct supervision/assist for medications management;Assist for transportation;Help with stairs or ramp for entrance;A little help with walking and/or transfers   Equipment Recommendations  None recommended by PT    Recommendations for Other Services       Precautions / Restrictions Precautions Precautions: Fall Precaution Comments: L hip  WBAT Restrictions Weight Bearing Restrictions: No RLE Weight Bearing: Weight bearing as tolerated LLE Weight Bearing: Weight bearing as tolerated     Mobility  Bed Mobility Overal bed mobility: Needs Assistance Bed Mobility: Supine to Sit, Sit to Supine     Supine to sit: Mod assist Sit to supine: Mod assist   General bed mobility comments: mod assist to manage mostly legs but assisted to sit up and return to bed    Transfers Overall transfer level: Needs assistance Equipment used: Rolling walker (2 wheels) Transfers: Sit to/from Stand Sit to Stand: Min assist           General transfer comment: min assist to power up and pt is requiring direct physical cues for flexing hip to sit    Ambulation/Gait Ambulation/Gait assistance: Min assist Gait Distance (Feet): 16 Feet (12 and 4) Assistive device: Rolling walker (2 wheels), 1 person hand held assist Gait Pattern/deviations: Step-to pattern, Decreased stride length, Decreased weight shift to left Gait velocity: reduced Gait velocity interpretation: <1.8 ft/sec, indicate of risk for recurrent falls   General Gait Details: ptis motivated today to walk and was much less a challenge to get her to understand esp as walker was placed in front of her   Stairs             Wheelchair Mobility    Modified Rankin (Stroke Patients Only)       Balance Overall balance assessment: Needs assistance Sitting-balance support: Feet supported Sitting balance-Leahy Scale: Fair     Standing balance support: Bilateral upper extremity supported, During functional activity Standing balance-Leahy Scale: Poor Standing balance comment: assisted to support on walker and pt is familiar with  helping to move it to sidestep                            Cognition Arousal/Alertness: Awake/alert Behavior During Therapy: Impulsive Overall Cognitive Status: History of cognitive impairments - at baseline                                  General Comments: minor discomfort to get to side of bed then basically asking to walk        Exercises General Exercises - Lower Extremity Ankle Circles/Pumps: AROM, 5 reps Long Arc Quad: AROM, AAROM, 10 reps Heel Slides: AAROM, AROM, 10 reps    General Comments General comments (skin integrity, edema, etc.): Pt is HOH but with visual cues and presenting a walker, pt is quite motivated and helpful      Pertinent Vitals/Pain Pain Assessment Pain Assessment: Faces Faces Pain Scale: Hurts a little bit Pain Location: LLE Pain Descriptors / Indicators: Guarding, Tender    Home Living                          Prior Function            PT Goals (current goals can now be found in the care plan section) Acute Rehab PT Goals Patient Stated Goal: get up to walk Progress towards PT goals: Progressing toward goals    Frequency    Min 3X/week      PT Plan Current plan remains appropriate    Co-evaluation              AM-PAC PT "6 Clicks" Mobility   Outcome Measure  Help needed turning from your back to your side while in a flat bed without using bedrails?: A Lot Help needed moving from lying on your back to sitting on the side of a flat bed without using bedrails?: A Lot Help needed moving to and from a bed to a chair (including a wheelchair)?: A Little Help needed standing up from a chair using your arms (e.g., wheelchair or bedside chair)?: A Little Help needed to walk in hospital room?: A Little Help needed climbing 3-5 steps with a railing? : Total 6 Click Score: 14    End of Session Equipment Utilized During Treatment: Gait belt Activity Tolerance: Patient limited by pain Patient left: in bed;with call bell/phone within reach;with bed alarm set Nurse Communication: Mobility status PT Visit Diagnosis: Muscle weakness (generalized) (M62.81);History of falling (Z91.81);Difficulty in walking, not elsewhere classified (R26.2)      Time: [id number] PT Time Calculation (min) (ACUTE ONLY): 20 min  Charges:  $Therapeutic Activity: 8-22 mins         Ramond Dial 01/12/2023, 12:21 PM  Mee Hives, PT PhD Acute Rehab Dept. Number: [place]

## 2023-01-12 NOTE — Progress Notes (Signed)
PROGRESS NOTE  Candice Ray [id number] DOB: 09/10/1932 DOA: 01/06/2023 PCP: Alanson Puls, [facility]   LOS: 6 days   Brief Narrative / Interim history: 89 y.o. female with past medical history significant for hearing loss, advanced dementia, DM2 , history of schizophrenia/psychosis and history of recurrent falls who presents to the ED with sudden onset of left hip pain on 01/06/2023.  Left hip x-ray showed subcapital left femoral neck fracture.  Patient underwent left hip hemiarthroplasty on 01/06/2023.   PT OT recommending SNF.  Currently pending placement   Subjective / 24h Interval events: No overnight events.  No complaints this morning  Assesement and Plan: Principal Problem:   Closed subcapital fracture of neck of left femur, initial encounter ([place]) Active Problems:   DM (diabetes mellitus), type 2 with neurological complications (HCC)   Dementia without behavioral disturbance (HCC)   Bilateral sensorineural hearing loss   Principal problem Left hip fracture status post hemiarthroplasty 3/8 -patient was admitted to the hospital with a left hip fracture.  She is status post prosthetic replacement on 3/8 by Dr. Erlinda Hong.  DVT prophylaxis with Lovenox, pain control with Norco.  Needs SNF   Active problems Dementia/schizophrenia - Supportive care, stable and at baseline DM2, with hyperglycemia - A1c 8.9.  Resume home occasions medications Social/ethics - Has advanced dementia and hearing loss.  Patient is DNR/DNI Dysphagia - Dysphagia 3 diet Elevated BP - Related to pain.  Blood pressure better with pain control Acute kidney injury - Baseline creatinine 0.7, peaked at 1.02 improved with fluids Abdominal distention secondary to ileus -Abdominal x-ray revealed postoperative ileus.  Out of bed to chair. Resolved Hypomagnesemia/hypokalemia -replace as indicated  Scheduled Meds:  docusate sodium  100 mg Oral BID   enoxaparin (LOVENOX) injection  40 mg Subcutaneous Q24H   insulin aspart   0-5 Units Subcutaneous QHS   insulin aspart  0-6 Units Subcutaneous TID WC   insulin aspart  3 Units Subcutaneous TID WC   insulin glargine-yfgn  3 Units Subcutaneous Daily   methocarbamol  500 mg Oral TID   Ensure Max Protein  237 mL Oral Daily   senna-docusate  2 tablet Oral QHS   sodium chloride flush  3 mL Intravenous Q12H   sodium chloride flush  3 mL Intravenous Q12H   Continuous Infusions:  sodium chloride     methocarbamol (ROBAXIN) IV     PRN Meds:.sodium chloride, acetaminophen, bisacodyl, fentaNYL (SUBLIMAZE) injection, guaiFENesin, hydrALAZINE, HYDROmorphone (DILAUDID) injection, ipratropium-albuterol, LORazepam, magnesium citrate, menthol-cetylpyridinium **OR** phenol, methocarbamol **OR** methocarbamol (ROBAXIN) IV, metoprolol tartrate, ondansetron **OR** ondansetron (ZOFRAN) IV, oxyCODONE, oxyCODONE, oxyCODONE, polyethylene glycol, senna-docusate, sodium chloride flush, sorbitol, traZODone  Current Outpatient Medications  Medication Instructions   acetaminophen (TYLENOL) 500 mg, Oral, Every 6 hours PRN   aspirin EC 325 mg, Oral, Daily   CALCIUM PO 1 tablet, Oral, Daily   docusate sodium (COLACE) 100 mg, Oral, 2 times daily PRN   Empagliflozin-metFORMIN HCl (SYNJARDY) 12.02-999 MG TABS 1 tablet, Oral, 2 times daily   enoxaparin (LOVENOX) 40 mg, Subcutaneous, Daily   HYDROcodone-acetaminophen (NORCO) 5-325 MG tablet 1 tablet, Oral, Every 6 hours PRN   Januvia 25 mg, Oral, Daily   NON FORMULARY Diet: Dysphagia 3 (puree) diet with thin liquids   ondansetron (ZOFRAN) 4 mg, Oral, Every 6 hours PRN   polyethylene glycol (MIRALAX / GLYCOLAX) 17 g, Oral, Daily PRN    Diet Orders (From admission, onward)     Start     Ordered   01/06/23 2101  Diet Carb Modified Fluid consistency: Thin; Room service appropriate? Yes  Diet effective now       Question Answer Comment  Calorie Level Medium 1600-2000   Fluid consistency: Thin   Room service appropriate? Yes      01/06/23 2100             DVT prophylaxis: enoxaparin (LOVENOX) injection 40 mg Start: 01/07/23 1600 SCDs Start: 01/06/23 2101 Place TED hose Start: 01/06/23 2101 SCDs Start: 01/06/23 1300 Place TED hose Start: 01/06/23 1300   Lab Results  Component Value Date   PLT 257 01/12/2023      Code Status: DNR  Family Communication: no family at bedside   Status is: Inpatient  Remains inpatient appropriate because: awaiting placement   Level of care: Med-Surg  Consultants:  Orthopedic surgery   Objective: Vitals:   01/11/23 1542 01/11/23 2018 01/12/23 0313 01/12/23 0742  BP: 136/66 123/66 126/73 131/86  Pulse: (!) 106 (!) 110 (!) 102 (!) 102  Resp: '17 17 16 16  '$ Temp: 98.2 F (36.8 C) 98.6 F (37 C) 98.4 F (36.9 C) 98.1 F (36.7 C)  TempSrc: Oral   Oral  SpO2: 97% 95% 94% 97%  Weight:        Intake/Output Summary (Last 24 hours) at 01/12/2023 1126 Last data filed at 01/12/2023 0350 Gross per 24 hour  Intake 480 ml  Output 1600 ml  Net -1120 ml    Wt Readings from Last 3 Encounters:  01/09/23 61 kg  06/02/22 49.6 kg  05/24/22 49.4 kg    Examination:  Constitutional: NAD Respiratory: CTA Cardiovascular: RRR  Data Reviewed: I have independently reviewed following labs and imaging studies   CBC Recent Labs  Lab 01/06/23 1027 01/06/23 2226 01/07/23 0548 01/08/23 0436 01/10/23 0220 01/11/23 0231 01/12/23 0332  WBC 5.2   < > 8.6 7.7 6.6 5.5 5.3  HGB 13.3   < > 11.0* 10.7* 10.0* 10.4* 10.7*  HCT 40.9   < > 32.7* 31.5* 29.8* 30.6* 32.3*  PLT 204   < > 185 146* 174 212 257  MCV 88.1   < > 87.0 88.5 85.6 86.2 85.9  MCH 28.7   < > 29.3 30.1 28.7 29.3 28.5  MCHC 32.5   < > 33.6 34.0 33.6 34.0 33.1  RDW 13.6   < > 13.5 13.3 13.3 13.2 13.3  LYMPHSABS 0.9  --   --  1.0  --   --   --   MONOABS 0.5  --   --  0.8  --   --   --   EOSABS 0.1  --   --  0.2  --   --   --   BASOSABS 0.0  --   --  0.0  --   --   --    < > = values in this interval not displayed.      Recent Labs  Lab 01/06/23 1027 01/06/23 1256 01/06/23 2226 01/07/23 1812 01/08/23 0436 01/10/23 0220 01/11/23 0231 01/12/23 0332  NA 132*  --    < > 131* 134* 132* 131* 133*  K 4.3  --    < > 3.7 3.7 3.8 4.4 4.0  CL 98  --    < > 100 101 97* 96* 96*  CO2 26  --    < > 23 21* '28 26 27  '$ GLUCOSE 258*  --    < > 163* 135* 223* 230* 210*  BUN 28*  --    < >  $'20 15 8 14 13  'b$ CREATININE 1.01*  --    < > 1.02* 0.79 0.64 0.58 0.67  CALCIUM 8.9  --    < > 8.5* 8.5* 8.3* 8.3* 8.7*  ALBUMIN  --   --   --  3.0*  --   --   --   --   MG  --   --   --   --  1.5* 1.5* 1.9 1.7  INR 1.1  --   --   --   --   --   --   --   HGBA1C  --  9.7*  --   --   --   --   --   --    < > = values in this interval not displayed.     ------------------------------------------------------------------------------------------------------------------ No results for input(s): "CHOL", "HDL", "LDLCALC", "TRIG", "CHOLHDL", "LDLDIRECT" in the last 72 hours.  Lab Results  Component Value Date   HGBA1C 9.7 (H) 01/06/2023   ------------------------------------------------------------------------------------------------------------------ No results for input(s): "TSH", "T4TOTAL", "T3FREE", "THYROIDAB" in the last 72 hours.  Invalid input(s): "FREET3"  Cardiac Enzymes No results for input(s): "CKMB", "TROPONINI", "MYOGLOBIN" in the last 168 hours.  Invalid input(s): "CK" ------------------------------------------------------------------------------------------------------------------ No results found for: "BNP"  CBG: Recent Labs  Lab 01/11/23 0734 01/11/23 1135 01/11/23 1543 01/11/23 2021 01/12/23 0744  GLUCAP 217* 285* 205* 244* 248*     Recent Results (from the past 240 hour(s))  Surgical pcr screen     Status: Abnormal   Collection Time: 01/06/23  4:47 PM   Specimen: Nasal Mucosa; Nasal Swab  Result Value Ref Range Status   MRSA, PCR POSITIVE (A) NEGATIVE Final    Comment: RESULT CALLED TO,  READ BACK BY AND VERIFIED WITH: EMAILED Charolett Bumpers [phone] @ 2032 FH    Staphylococcus aureus POSITIVE (A) NEGATIVE Final    Comment: (NOTE) The Xpert SA Assay (FDA approved for NASAL specimens in patients 50 years of age and older), is one component of a comprehensive surveillance program. It is not intended to diagnose infection nor to guide or monitor treatment. Performed at [facility], [address] [place], [place] [address]      Radiology Studies: No results found.   Marzetta Board, MD, PhD [facility]  Between 7 am - 7 pm I am available, please contact me via Amion (for emergencies) or Securechat (non urgent messages)  Between 7 pm - 7 am I am not available, please contact night coverage MD/APP via Amion

## 2023-01-12 NOTE — TOC Progression Note (Addendum)
Transition of Care [facility]) - Progression Note    Patient Details  Name: Candice Ray MRN: [id number] Date of Birth: [birth year]  Transition of Care [facility]) CM/SW Contact  Jinger Neighbors,  Phone Number: 01/12/2023, 2:42 PM  Clinical Narrative:     CSW left a voicemail for [place] at [facility] making her aware pt's Josem Kaufmann was approved. [place] called back and asked for pt to have a COVID test completed. CSW messaged attending and RN to ask for COVID test. Plan for d/c to [facility] tomorrow if test is negative.   CSW called pt's son to provide update; Son reports understanding and no concerns.   CSW completed medical transport form and printed along with face sheet. Both are printed and at the front desk: just add DNR  Expected Discharge Plan: [place] Barriers to Discharge: Continued Medical Work up  Expected Discharge Plan and [place] arrangements for the past 2 months: Single Family Home                                       Social Determinants of Health (SDOH) Interventions SDOH Screenings   Tobacco Use: Low Risk  (01/08/2023)    Readmission Risk Interventions     No data to display

## 2023-01-12 NOTE — Plan of Care (Signed)
  Problem: Skin Integrity: Goal: Risk for impaired skin integrity will decrease Outcome: Progressing   Problem: Tissue Perfusion: Goal: Adequacy of tissue perfusion will improve Outcome: Progressing   Problem: Activity: Goal: Risk for activity intolerance will decrease Outcome: Progressing   Problem: Nutrition: Goal: Adequate nutrition will be maintained Outcome: Progressing   

## 2023-01-12 NOTE — Inpatient Diabetes Management (Signed)
Inpatient Diabetes Program Recommendations  AACE/ADA: New Consensus Statement on Inpatient Glycemic Control (2015)  Target Ranges:  Prepandial:   less than 140 mg/dL      Peak postprandial:   less than 180 mg/dL (1-2 hours)      Critically ill patients:  140 - 180 mg/dL   Lab Results  Component Value Date   GLUCAP 248 (H) 01/12/2023   HGBA1C 9.7 (H) 01/06/2023    Review of Glycemic Control  Latest Reference Range & Units 01/11/23 07:34 01/11/23 11:35 01/11/23 15:43 01/11/23 20:21 01/12/23 07:44  Glucose-Capillary 70 - 99 mg/dL 217 (H) 285 (H) 205 (H) 244 (H) 248 (H)  (H): Data is abnormally high  Diabetes history: DM2 Outpatient Diabetes medications: Synjardy 12.02-999 mg BID, Januvua 25 mg QD Current orders for Inpatient glycemic control: Semglee 3 units QD, Novolog 0-6 units TID and 0-5 units QHS, Novolog 3 units TID with meals   Inpatient Diabetes Program Recommendations:     Might consider:   Semglee 3 units BID (0.1 units x 61 kg/24 hrs).   Will continue to follow while inpatient.   Thank you, Reche Dixon, MSN, [place] Diabetes Coordinator Inpatient Diabetes Program [phone] (team pager from 8a-5p)

## 2023-01-13 ENCOUNTER — Other Ambulatory Visit: Payer: Self-pay | Admitting: Adult Health

## 2023-01-13 LAB — CBC
HCT: 32.9 % — ABNORMAL LOW (ref 36.0–46.0)
Hemoglobin: 10.9 g/dL — ABNORMAL LOW (ref 12.0–15.0)
MCH: 28.5 pg (ref 26.0–34.0)
MCHC: 33.1 g/dL (ref 30.0–36.0)
MCV: 86.1 fL (ref 80.0–100.0)
Platelets: 293 10*3/uL (ref 150–400)
RBC: 3.82 MIL/uL — ABNORMAL LOW (ref 3.87–5.11)
RDW: 13.2 % (ref 11.5–15.5)
WBC: 5.1 10*3/uL (ref 4.0–10.5)
nRBC: 0 % (ref 0.0–0.2)

## 2023-01-13 LAB — GLUCOSE, CAPILLARY: Glucose-Capillary: 266 mg/dL — ABNORMAL HIGH (ref 70–99)

## 2023-01-13 LAB — BASIC METABOLIC PANEL
Anion gap: 9 (ref 5–15)
BUN: 16 mg/dL (ref 8–23)
CO2: 26 mmol/L (ref 22–32)
Calcium: 8.7 mg/dL — ABNORMAL LOW (ref 8.9–10.3)
Chloride: 96 mmol/L — ABNORMAL LOW (ref 98–111)
Creatinine, Ser: 0.63 mg/dL (ref 0.44–1.00)
GFR, Estimated: >60 mL/min (ref >60)
Glucose, Bld: 235 mg/dL — ABNORMAL HIGH (ref 70–99)
Potassium: 4.3 mmol/L (ref 3.5–5.1)
Sodium: 131 mmol/L — ABNORMAL LOW (ref 135–145)

## 2023-01-13 LAB — MAGNESIUM: Magnesium: 1.6 mg/dL — ABNORMAL LOW (ref 1.7–2.4)

## 2023-01-13 MED ORDER — HYDROCODONE-ACETAMINOPHEN 5-325 MG PO TABS: 1.0000 | ORAL_TABLET | Freq: Four times a day (QID) | ORAL | 0 refills | Status: DC | PRN

## 2023-01-13 NOTE — Discharge Summary (Signed)
Physician Discharge Summary  Candice Ray [id number] DOB: 1932-07-23 DOA: 01/06/2023  PCP: Alanson Puls [facility] date: 01/06/2023 Discharge date: 01/13/2023  Admitted From: home Disposition:  SNF  Recommendations for Outpatient Follow-up:  Follow up with PCP in 1-2 weeks Follow-up with orthopedic surgery in 2 weeks  Home Health: none Equipment/Devices: none  Discharge Condition: stable CODE STATUS: DNR Diet Orders (From admission, onward)     Start     Ordered   01/06/23 2101  Diet Carb Modified Fluid consistency: Thin; Room service appropriate? Yes  Diet effective now       Question Answer Comment  Calorie Level Medium 1600-2000   Fluid consistency: Thin   Room service appropriate? Yes      01/06/23 2100            Brief Narrative / Interim history: 89 y.o. female with past medical history significant for hearing loss, advanced dementia, DM2 , history of schizophrenia/psychosis and history of recurrent falls who presents to the ED with sudden onset of left hip pain on 01/06/2023.  Left hip x-ray showed subcapital left femoral neck fracture.  Patient underwent left hip hemiarthroplasty on 01/06/2023.   Hospital Course / Discharge diagnoses: Principal Problem:   Closed subcapital fracture of neck of left femur, initial encounter ([place]) Active Problems:   DM (diabetes mellitus), type 2 with neurological complications ([place])   Dementia without behavioral disturbance (H[facility])   Bilateral sensorineural hearing loss   Principal problem Left hip fracture status post hemiarthroplasty 3/8 -patient was admitted to the hospital with a left hip fracture.  She is status post prosthetic replacement on 3/8 by Dr. Erlinda Hong.  DVT prophylaxis with Lovenox, pain control with Norco.  Needs SNF   Active problems Dementia/schizophrenia - Supportive care, stable and at baseline DM2, with hyperglycemia - A1c 8.9.  Resume home medications Social/ethics - Has advanced dementia and hearing  loss.  Patient is DNR/DNI Dysphagia - Dysphagia 3 diet Elevated BP - Related to pain.  Blood pressure better with pain control Acute kidney injury - Baseline creatinine 0.7, peaked at 1.02 improved with fluids Abdominal distention secondary to ileus -Abdominal x-ray revealed postoperative ileus.  Out of bed to chair. Resolved Hypomagnesemia/hypokalemia -replaced  Sepsis ruled out   Discharge Instructions  Discharge Instructions     Weight bearing as tolerated   Complete by: As directed       Allergies as of 01/13/2023       Reactions   Contrast Media [iodinated Contrast Media] Swelling   Metrizamide Swelling        Medication List     TAKE these medications    acetaminophen 500 MG tablet Commonly known as: TYLENOL Take 1 tablet (500 mg total) by mouth every 6 (six) hours as needed for mild pain or moderate pain.   aspirin EC 325 MG tablet Take 325 mg by mouth daily.   CALCIUM PO Take 1 tablet by mouth daily.   docusate sodium 100 MG capsule Commonly known as: COLACE Take 1 capsule (100 mg total) by mouth 2 (two) times daily as needed for mild constipation or moderate constipation.   enoxaparin 40 MG/0.4ML injection Commonly known as: LOVENOX Inject 0.4 mLs (40 mg total) into the skin daily for 14 days.   HYDROcodone-acetaminophen 5-325 MG tablet Commonly known as: Norco Take 1 tablet by mouth every 6 (six) hours as needed.   Januvia 25 MG tablet Generic drug: sitaGLIPtin Take 25 mg by mouth daily.   NON FORMULARY  Diet: Dysphagia 3 (puree) diet with thin liquids   ondansetron 4 MG tablet Commonly known as: ZOFRAN Take 1 tablet (4 mg total) by mouth every 6 (six) hours as needed for nausea.   polyethylene glycol 17 g packet Commonly known as: MIRALAX / GLYCOLAX Take 17 g by mouth daily as needed for moderate constipation or severe constipation.   Synjardy 12.02-999 MG Tabs Generic drug: Empagliflozin-metFORMIN HCl Take 1 tablet by mouth 2 (two)  times daily.               Discharge Care Instructions  (From admission, onward)           Start     Ordered   01/07/23 0000  Weight bearing as tolerated        01/07/23 0945            Follow-up Information     Leandrew Koyanagi, MD Follow up in 2 week(s).   Specialty: Orthopedic Surgery Why: For suture removal, For wound re-check Contact information: [place] [address] [phone]                 Consultations: Orthopedic surgery  Procedures/Studies:  DG Abd 1 View  Result Date: 01/07/2023 CLINICAL DATA:  Abdominal distention EXAM: ABDOMEN - 1 VIEW COMPARISON:  Pelvis radiograph 01/06/2023 and CT abdomen and pelvis 03/18/2021 FINDINGS: Diffuse gaseous distention of the colon which is at the upper limits of normal in the cecum and ascending/transverse colon. Gas is present within the rectum. No radiographic evidence of obstruction. Findings are favored to represent postoperative ileus given recent surgery. Bilateral hip hemiarthroplasty. IMPRESSION: Constellation of findings favored to represent postoperative ileus. Electronically Signed   By: Placido Sou M.D.   On: 01/07/2023 19:42   Pelvis Portable  Result Date: 01/06/2023 CLINICAL DATA:  Hip replacement. EXAM: PORTABLE PELVIS 1-2 VIEWS COMPARISON:  Preoperative radiograph earlier today FINDINGS: Left hip arthroplasty in expected alignment. No periprosthetic lucency or fracture. Recent postsurgical change includes air and edema in the soft tissues. Prior right hip arthroplasty and remote left pubic rami fractures again seen. IMPRESSION: Left hip arthroplasty without immediate postoperative complication. Electronically Signed   By: Keith Rake M.D.   On: 01/06/2023 19:53   DG HIP UNILAT WITH PELVIS 1V LEFT  Result Date: 01/06/2023 CLINICAL DATA:  Elective surgery EXAM: DG HIP (WITH OR WITHOUT PELVIS) 1V*L* COMPARISON:  Left hip x-ray 12/2022 FINDINGS: Six intraoperative  fluoroscopic views of the left hip. Left hip arthroplasty was placed. Healed left pubic rami fractures and right hip arthroplasty are unchanged. Fluoroscopy time 7 seconds. Fluoroscopy dose 0.48 micro gray. IMPRESSION: Intraoperative fluoroscopic views of the left hip arthroplasty. Electronically Signed   By: Ronney Asters M.D.   On: 01/06/2023 18:49   DG C-Arm 1-60 Min-No Report  Result Date: 01/06/2023 Fluoroscopy was utilized by the requesting physician.  No radiographic interpretation.   DG HIP UNILAT WITH PELVIS 2-3 VIEWS LEFT  Result Date: 01/06/2023 CLINICAL DATA:  Acute left hip pain. Patient brought in by family with report the left hip pain when patient woke up. EXAM: DG HIP (WITH OR WITHOUT PELVIS) 2-3V LEFT COMPARISON:  Hip radiographs dated April 22, 2022 FINDINGS: There is subcapital left femoral neck fracture with superolateral displacement of the distal femur status post right hip arthroplasty with intact hardware. Osteopenia and degenerate disc disease of the lower lumbar spine. IMPRESSION: Subcapital left femoral neck fracture with superolateral displacement of the distal femur. Electronically Signed   By: Wyatt Mage  Ahmed D.O.   On: 01/06/2023 11:51     Subjective: No complaints  Discharge Exam: BP 103/60 (BP Location: Left Arm)   Pulse 89   Temp 98 F (36.7 C) (Oral)   Resp 17   Wt 61 kg   SpO2 95%   BMI 22.38 kg/m   General: Pt is alert, awake, not in acute distress Cardiovascular: RRR, S1/S2 +, no rubs, no gallops Respiratory: CTA bilaterally, no wheezing, no rhonchi Abdominal: Soft, NT, ND, bowel sounds + Extremities: no edema, no cyanosis    The results of significant diagnostics from this hospitalization (including imaging, microbiology, ancillary and laboratory) are listed below for reference.     Microbiology: Recent Results (from the past 240 hour(s))  Surgical pcr screen     Status: Abnormal   Collection Time: 01/06/23  4:47 PM   Specimen: Nasal Mucosa;  Nasal Swab  Result Value Ref Range Status   MRSA, PCR POSITIVE (A) NEGATIVE Final    Comment: RESULT CALLED TO, READ BACK BY AND VERIFIED WITH: EMAILED Charolett Bumpers [phone] @ 2032 FH    Staphylococcus aureus POSITIVE (A) NEGATIVE Final    Comment: (NOTE) The Xpert SA Assay (FDA approved for NASAL specimens in patients 44 years of age and older), is one component of a comprehensive surveillance program. It is not intended to diagnose infection nor to guide or monitor treatment. Performed at [facility], [place] [address], [place], [place] [address]   Resp panel by RT-PCR (RSV, Flu A&B, Covid) Anterior Nasal Swab     Status: None   Collection Time: 01/12/23  4:56 PM   Specimen: Anterior Nasal Swab  Result Value Ref Range Status   SARS Coronavirus 2 by RT PCR NEGATIVE NEGATIVE Final   Influenza A by PCR NEGATIVE NEGATIVE Final   Influenza B by PCR NEGATIVE NEGATIVE Final    Comment: (NOTE) The Xpert Xpress SARS-CoV-2/FLU/RSV plus assay is intended as an aid in the diagnosis of influenza from Nasopharyngeal swab specimens and should not be used as a sole basis for treatment. Nasal washings and aspirates are unacceptable for Xpert Xpress SARS-CoV-2/FLU/RSV testing.  Fact Sheet for Patients: EntrepreneurPulse.com.au  Fact Sheet for Healthcare Providers: IncredibleEmployment.be  This test is not yet approved or cleared by the [place] FDA and has been authorized for detection and/or diagnosis of SARS-CoV-2 by FDA under an Emergency Use Authorization (EUA). This EUA will remain in effect (meaning this test can be used) for the duration of the COVID-19 declaration under Section 564(b)(1) of the Act, 21 U.S.C. section 360bbb-3(b)(1), unless the authorization is terminated or revoked.     Resp Syncytial Virus by PCR NEGATIVE NEGATIVE Final    Comment: (NOTE) Fact Sheet for Patients: EntrepreneurPulse.com.au  Fact Sheet for  Healthcare Providers: IncredibleEmployment.be  This test is not yet approved or cleared by the [place] FDA and has been authorized for detection and/or diagnosis of SARS-CoV-2 by FDA under an Emergency Use Authorization (EUA). This EUA will remain in effect (meaning this test can be used) for the duration of the COVID-19 declaration under Section 564(b)(1) of the Act, 21 U.S.C. section 360bbb-3(b)(1), unless the authorization is terminated or revoked.  Performed at [facility], [place] [address], [place], [place] [address]      Labs: Basic Metabolic Panel: Recent Labs  Lab 01/07/23 1812 01/08/23 0436 01/10/23 0220 01/11/23 0231 01/12/23 0332 01/13/23 0621  NA 131* 134* 132* 131* 133* 131*  K 3.7 3.7 3.8 4.4 4.0 4.3  CL 100 101 97* 96* 96* 96*  CO2 23 21* 28 26 27 26   GLUCOSE 163* 135* 223* 230* 210* 235*  BUN 20 15 8 14 13 16   CREATININE 1.02* 0.79 0.64 0.58 0.67 0.63  CALCIUM 8.5* 8.5* 8.3* 8.3* 8.7* 8.7*  MG  --  1.5* 1.5* 1.9 1.7 1.6*  PHOS 3.0  --   --   --   --   --    Liver Function Tests: Recent Labs  Lab 01/07/23 1812  ALBUMIN 3.0*   CBC: Recent Labs  Lab 01/06/23 1027 01/06/23 2226 01/08/23 0436 01/10/23 0220 01/11/23 0231 01/12/23 0332 01/13/23 0621  WBC 5.2   < > 7.7 6.6 5.5 5.3 5.1  NEUTROABS 3.8  --  5.7  --   --   --   --   HGB 13.3   < > 10.7* 10.0* 10.4* 10.7* 10.9*  HCT 40.9   < > 31.5* 29.8* 30.6* 32.3* 32.9*  MCV 88.1   < > 88.5 85.6 86.2 85.9 86.1  PLT 204   < > 146* 174 212 257 293   < > = values in this interval not displayed.   CBG: Recent Labs  Lab 01/12/23 1139 01/12/23 1531 01/12/23 1537 01/12/23 2051 01/13/23 0803  GLUCAP 332* 237* 230* 170* 266*   Hgb A1c No results for input(s): "HGBA1C" in the last 72 hours. Lipid Profile No results for input(s): "CHOL", "HDL", "LDLCALC", "TRIG", "CHOLHDL", "LDLDIRECT" in the last 72 hours. Thyroid function studies No results for input(s): "TSH",  "T4TOTAL", "T3FREE", "THYROIDAB" in the last 72 hours.  Invalid input(s): "FREET3" Urinalysis    Component Value Date/Time   COLORURINE YELLOW 12/30/2021 0150   APPEARANCEUR CLEAR 12/30/2021 0150   LABSPEC 1.024 12/30/2021 0150   PHURINE 5.0 12/30/2021 0150   GLUCOSEU >=500 (A) 12/30/2021 0150   HGBUR MODERATE (A) 12/30/2021 0150   BILIRUBINUR NEGATIVE 12/30/2021 0150   KETONESUR 80 (A) 12/30/2021 0150   PROTEINUR 30 (A) 12/30/2021 0150   NITRITE NEGATIVE 12/30/2021 0150   LEUKOCYTESUR SMALL (A) 12/30/2021 0150    FURTHER DISCHARGE INSTRUCTIONS:   Get Medicines reviewed and adjusted: Please take all your medications with you for your next visit with your Primary MD   Laboratory/radiological data: Please request your Primary MD to go over all hospital tests and procedure/radiological results at the follow up, please ask your Primary MD to get all Hospital records sent to his/her office.   In some cases, they will be blood work, cultures and biopsy results pending at the time of your discharge. Please request that your primary care M.D. goes through all the records of your hospital data and follows up on these results.   Also Note the following: If you experience worsening of your admission symptoms, develop shortness of breath, life threatening emergency, suicidal or homicidal thoughts you must seek medical attention immediately by calling 911 or calling your MD immediately  if symptoms less severe.   You must read complete instructions/literature along with all the possible adverse reactions/side effects for all the Medicines you take and that have been prescribed to you. Take any new Medicines after you have completely understood and accpet all the possible adverse reactions/side effects.    Do not drive when taking Pain medications or sleeping medications (Benzodaizepines)   Do not take more than prescribed Pain, Sleep and Anxiety Medications. It is not advisable to combine  anxiety,sleep and pain medications without talking with your primary care practitioner   Special Instructions: If  you have smoked or chewed Tobacco  in the last 2 yrs please stop smoking, stop any regular Alcohol  and or any Recreational drug use.   Wear Seat belts while driving.   Please note: You were cared for by a hospitalist during your hospital stay. Once you are discharged, your primary care physician will handle any further medical issues. Please note that NO REFILLS for any discharge medications will be authorized once you are discharged, as it is imperative that you return to your primary care physician (or establish a relationship with a primary care physician if you do not have one) for your post hospital discharge needs so that they can reassess your need for medications and monitor your lab values.  Time coordinating discharge: 35 minutes  SIGNED:  Marzetta Board, MD, PhD 01/13/2023, 9:51 AM

## 2023-01-13 NOTE — Progress Notes (Signed)
Occupational Therapy Treatment Patient Details Name: Candice Ray MRN: [id number] DOB: [birth year] Today's Date: 01/13/2023   History of present illness 89 y/o F transferred from [facility] to [facility] on 3/8 for L hip pain, found to have a L femoral neck fracture. Pt now s/p L hip hemi-arthroplasty on 3/8, WBAT without hip precautions. PMHx: dementia/schizophrenia, DM, hearing loss.   OT comments  Patient demonstrated good gains with bed mobility requiring mod assist of one and performed transfer to [facility] with mod assist due to assistance needed with RW. Patient able to stand for toilet hygiene and required max assist for hygiene and changing mesh underwear. Patient more pleasant on this date and eager to participate. Patient would benefit from continued OT treatment to address LB bathing and dressing and functional transfers. Acute OT to continue to follow.    Recommendations for follow up therapy are one component of a multi-disciplinary discharge planning process, led by the attending physician.  Recommendations may be updated based on patient status, additional functional criteria and insurance authorization.    Follow Up Recommendations  Skilled nursing-short term rehab (<3 hours/day)     Assistance Recommended at Discharge Frequent or constant Supervision/Assistance  Patient can return home with the following  A lot of help with walking and/or transfers;A lot of help with bathing/dressing/bathroom;Assistance with cooking/housework;Assistance with feeding;Direct supervision/assist for medications management;Assist for transportation;Direct supervision/assist for financial management;Help with stairs or ramp for entrance   Equipment Recommendations  Other (comment) (defer)    Recommendations for Other Services      Precautions / Restrictions Precautions Precautions: Fall Precaution Comments: L hip WBAT Restrictions Weight Bearing Restrictions: No       Mobility Bed Mobility Overal  bed mobility: Needs Assistance Bed Mobility: Supine to Sit     Supine to sit: Mod assist     General bed mobility comments: assistance with BLE and bed pad used to assist with scooting towards EOB    Transfers Overall transfer level: Needs assistance Equipment used: Rolling walker (2 wheels) Transfers: Sit to/from Stand, Bed to chair/wheelchair/BSC Sit to Stand: Min assist     Step pivot transfers: Mod assist     General transfer comment: mod assist to steady and with use of RW     Balance Overall balance assessment: Needs assistance Sitting-balance support: Feet supported Sitting balance-Leahy Scale: Fair     Standing balance support: Bilateral upper extremity supported, During functional activity Standing balance-Leahy Scale: Poor Standing balance comment: reliant on RW for support when standing                           ADL either performed or assessed with clinical judgement   ADL Overall ADL's : Needs assistance/impaired     Grooming: Wash/dry hands;Wash/dry face;Brushing hair;Supervision/safety;Sitting   Upper Body Bathing: Supervision/ safety;Sitting Upper Body Bathing Details (indicate cue type and reason): in recliner Lower Body Bathing: Maximal assistance;Sit to/from stand   Upper Body Dressing : Minimal assistance;Sitting Upper Body Dressing Details (indicate cue type and reason): change gown Lower Body Dressing: Maximal assistance;Sit to/from stand Lower Body Dressing Details (indicate cue type and reason): changed mesh underwear Toilet Transfer: Moderate assistance;Ambulation;BSC/3in1;Rolling walker (2 wheels) Toilet Transfer Details (indicate cue type and reason): cues for safety Toileting- Clothing Manipulation and Hygiene: Maximal assistance;Sit to/from stand Toileting - Clothing Manipulation Details (indicate cue type and reason): stood for toilet hygiene with patient reliant on UE support       General ADL Comments:  demonstrated  gains with transfers and self care    Extremity/Trunk Assessment              Vision       Perception     Praxis      Cognition Arousal/Alertness: Awake/alert Behavior During Therapy: Impulsive Overall Cognitive Status: History of cognitive impairments - at baseline                                 General Comments: pleasant, eager to get OOB        Exercises      Shoulder Instructions       General Comments      Pertinent Vitals/ Pain       Pain Assessment Pain Assessment: Faces Faces Pain Scale: Hurts a little bit Pain Location: LLE Pain Descriptors / Indicators: Grimacing, Guarding Pain Intervention(s): Limited activity within patient's tolerance, Monitored during session, Repositioned  Home Living                                          Prior Functioning/Environment              Frequency  Min 2X/week        Progress Toward Goals  OT Goals(current goals can now be found in the care plan section)  Progress towards OT goals: Progressing toward goals  Acute Rehab OT Goals Patient Stated Goal: get better OT Goal Formulation: With patient Time For Goal Achievement: 01/21/23 Potential to Achieve Goals: Good ADL Goals Pt Will Perform Grooming: with supervision Pt Will Perform Lower Body Dressing: with mod assist;sit to/from stand Pt Will Transfer to Toilet: with mod assist;stand pivot transfer;bedside commode Additional ADL Goal #1: Pt will tolerate 5x of standing ADL task with min A overall  Plan Discharge plan remains appropriate    Co-evaluation                 AM-PAC OT "6 Clicks" Daily Activity     Outcome Measure   Help from another person eating meals?: None Help from another person taking care of personal grooming?: A Little Help from another person toileting, which includes using toliet, bedpan, or urinal?: A Lot Help from another person bathing (including washing, rinsing, drying)?: A  Lot Help from another person to put on and taking off regular upper body clothing?: A Little Help from another person to put on and taking off regular lower body clothing?: A Lot 6 Click Score: 16    End of Session Equipment Utilized During Treatment: Rolling walker (2 wheels);Gait belt  OT Visit Diagnosis: Unsteadiness on feet (R26.81);Other abnormalities of gait and mobility (R26.89);Muscle weakness (generalized) (M62.81);History of falling (Z91.81);Pain Pain - Right/Left: Left Pain - part of body: Leg   Activity Tolerance Patient tolerated treatment well   Patient Left in chair;with call bell/phone within reach;with chair alarm set   Nurse Communication Mobility status        Time: [id number] OT Time Calculation (min): 29 min  Charges: OT General Charges $OT Visit: 1 Visit OT Treatments $Self Care/Home Management : 23-37 mins  Lodema Hong, [place]  Office [place] 01/13/2023, 8:59 AM

## 2023-01-13 NOTE — Progress Notes (Signed)
Patient in yellow mews at this time due to increased heart rate.  Patient had just finished activity with physical therapy when vitas signs were obtained.  Vital signs will be rechecked by nurse tech.

## 2023-01-13 NOTE — Progress Notes (Signed)
Report called to [facility] at this time and given to [facility]

## 2023-01-13 NOTE — TOC Transition Note (Signed)
Transition of Care [facility]) - CM/SW Discharge Note   Patient Details  Name: Candice Ray MRN: [id number] Date of Birth: 03/17/1932  [facility] CM/SW Contact:  Amador Cunas, [place] [facility] Phone Number: 01/13/2023, 10:31 AM   Clinical Narrative:  Pt for dc to [facility] today. Spoke to [place] in admissions who confirmed they are prepared to admit pt to room 129. Pt's DIL Opal Sidles has completed admission paperwork at [facility] and is agreeable to dc plan. RN provided with number for report and PTAR arranged for transport. SW signing off at dc.   Wandra Feinstein, MSW, LCSW [phone] (coverage)       Final next level of care: Skilled Nursing Facility Barriers to Discharge: No Barriers Identified   Patient Goals and CMS Choice CMS Medicare.gov Compare Post Acute Care list provided to:: Other (Comment Required) (adult children) Choice offered to / list presented to : Adult Children  Discharge Placement                Patient [facility] bed at: [facility] Patient to be transferred to facility by: [facility] Name of family member notified: jane/DIL Patient and family notified of of transfer: 01/13/23  Discharge Plan and Services Additional resources added to the After Visit Summary for                                       Social Determinants of Health (SDOH) Interventions SDOH Screenings   Tobacco Use: Low Risk  (01/08/2023)     Readmission Risk Interventions     No data to display

## 2023-01-13 NOTE — Inpatient Diabetes Management (Signed)
Inpatient Diabetes Program Recommendations  AACE/ADA: New Consensus Statement on Inpatient Glycemic Control (2015)  Target Ranges:  Prepandial:   less than 140 mg/dL      Peak postprandial:   less than 180 mg/dL (1-2 hours)      Critically ill patients:  140 - 180 mg/dL   Lab Results  Component Value Date   GLUCAP 266 (H) 01/13/2023   HGBA1C 9.7 (H) 01/06/2023    Review of Glycemic Control  Latest Reference Range & Units 01/12/23 07:44 01/12/23 11:39 01/12/23 15:31 01/12/23 15:37 01/12/23 20:51 01/13/23 08:03  Glucose-Capillary 70 - 99 mg/dL 248 (H) 332 (H) 237 (H) 230 (H) 170 (H) 266 (H)  (H): Data is abnormally high  Diabetes history: DM2 Outpatient Diabetes medications: Synjardy 12.02-999 mg BID, Januvua 25 mg QD Current orders for Inpatient glycemic control: Semglee 3 units QD, Novolog 0-6 units TID and 0-5 units QHS, Novolog 3 units TID with meals   Inpatient Diabetes Program Recommendations:     Might consider:   Semglee 3 units BID (0.1 units x 61 kg/24 hrs) Novolog 4 units TID with meals    Will continue to follow while inpatient.   Thank you, Reche Dixon, MSN, [place] Diabetes Coordinator Inpatient Diabetes Program [phone] (team pager from 8a-5p)

## 2023-01-16 ENCOUNTER — Non-Acute Institutional Stay (SKILLED_NURSING_FACILITY): Payer: Medicare Other | Admitting: Adult Health

## 2023-01-16 ENCOUNTER — Encounter: Payer: Self-pay | Admitting: Adult Health

## 2023-01-16 DIAGNOSIS — F209 Schizophrenia, unspecified: Secondary | ICD-10-CM

## 2023-01-16 DIAGNOSIS — I700 Atherosclerosis of aorta: Secondary | ICD-10-CM | POA: Diagnosis not present

## 2023-01-16 DIAGNOSIS — S72012D Unspecified intracapsular fracture of left femur, subsequent encounter for closed fracture with routine healing: Secondary | ICD-10-CM | POA: Diagnosis not present

## 2023-01-16 DIAGNOSIS — E440 Moderate protein-calorie malnutrition: Secondary | ICD-10-CM

## 2023-01-16 DIAGNOSIS — S72012S Unspecified intracapsular fracture of left femur, sequela: Secondary | ICD-10-CM

## 2023-01-16 DIAGNOSIS — E1149 Type 2 diabetes mellitus with other diabetic neurological complication: Secondary | ICD-10-CM

## 2023-01-16 DIAGNOSIS — F0390 Unspecified dementia without behavioral disturbance: Secondary | ICD-10-CM

## 2023-01-16 NOTE — Progress Notes (Incomplete)
Location:  [place] Room Number: [id number] Place of Service:  SNF (31)   CODE STATUS: DNR  Allergies  Allergen Reactions   Contrast Media [Iodinated Contrast Media] Swelling   Metrizamide Swelling    Chief Complaint  Patient presents with   Hospitalization Follow-up    HPI:     Brief Narrative / Interim history: 89 y.o. female with past medical history significant for hearing loss, advanced dementia, DM2 , history of schizophrenia/psychosis and history of recurrent falls who presents to the ED with sudden onset of left hip pain on 01/06/2023.  Left hip x-ray showed subcapital left femoral neck fracture.  Patient underwent left hip hemiarthroplasty on 01/06/2023.    Hospital Course / Discharge diagnoses: Principal Problem:   Closed subcapital fracture of neck of left femur, initial encounter ([place]) Active Problems:   DM (diabetes mellitus), type 2 with neurological complications ([place])   Dementia without behavioral disturbance ([facility])   Bilateral sensorineural hearing loss     Principal problem Left hip fracture status post hemiarthroplasty 3/8 -patient was admitted to the hospital with a left hip fracture.  She is status post prosthetic replacement on 3/8 by Dr. Erlinda Hong.  DVT prophylaxis with Lovenox, pain control with Norco.  Needs SNF   Active problems Dementia/schizophrenia - Supportive care, stable and at baseline DM2, with hyperglycemia - A1c 8.9.  Resume home medications Social/ethics - Has advanced dementia and hearing loss.  Patient is DNR/DNI Dysphagia - Dysphagia 3 diet Elevated BP - Related to pain.  Blood pressure better with pain control Acute kidney injury - Baseline creatinine 0.7, peaked at 1.02 improved with fluids Abdominal distention secondary to ileus -Abdominal x-ray revealed postoperative ileus.  Out of bed to chair. Resolved Hypomagnesemia/hypokalemia -replaced    ***  Past Medical History:  Diagnosis Date   Diabetes mellitus    HOH  (hard of hearing)    Poor historian    Sinus trouble     Past Surgical History:  Procedure Laterality Date   COLONOSCOPY  2007   Dr. Gala Romney: tubular adenomas/rectal   COLONOSCOPY N/A 01/06/2014   [id number] diverticulosis. Colonic polyp-removed as described above. Status post segmental biopsy and stool collection for culture   HERNIA REPAIR     umbilical, [facility]   HIP ARTHROPLASTY Right 04/22/2022   Procedure: ARTHROPLASTY BIPOLAR HIP (HEMIARTHROPLASTY);  Surgeon: Altamese Castine, MD;  Location: [place];  Service: Orthopedics;  Laterality: Right;   HIP ARTHROPLASTY Left 01/06/2023   Procedure: LEFT HIP HEMIARTHROPLASTY;  Surgeon: Leandrew Koyanagi, MD;  Location: [place];  Service: Orthopedics;  Laterality: Left;   HYSTEROSCOPY WITH D & C  05/23/2012   Procedure: DILATATION AND CURETTAGE /HYSTEROSCOPY;  Surgeon: Florian Buff, MD;  Location: [facility];  Service: Gynecology;  Laterality: N/A;   POLYPECTOMY  05/23/2012   Procedure: POLYPECTOMY;  Surgeon: Florian Buff, MD;  Location: [facility];  Service: Gynecology;  Laterality: N/A;  endometrial polypectomy    Social History   Socioeconomic History   Marital status: Married    Spouse name: Not on file   Number of children: Not on file   Years of education: Not on file   Highest education level: Not on file  Occupational History   Not on file  Tobacco Use   Smoking status: Never   Smokeless tobacco: Never  Vaping Use   Vaping Use: Never used  Substance and Sexual Activity   Alcohol use: No   Drug use: No   Sexual activity: Not  on file  Other Topics Concern   Not on file  Social History Narrative   Not on file   Social Determinants of Health   Financial Resource Strain: Not on file  Food Insecurity: Not on file  Transportation Needs: Not on file  Physical Activity: Not on file  Stress: Not on file  Social Connections: Not on file  Intimate Partner Violence: Not on file   Family History  Problem Relation Age of Onset   Colon cancer Neg  Hx       VITAL SIGNS BP (!) 97/58   Pulse 96   Temp 98.6 F (37 C)   Resp 20   Ht 5\' 5"  (1.651 m)   Wt 127 lb (57.6 kg)   SpO2 96%   BMI 21.13 kg/m   Outpatient Encounter Medications as of 01/16/2023  Medication Sig   acetaminophen (TYLENOL) 500 MG tablet Take 1 tablet (500 mg total) by mouth every 6 (six) hours as needed for mild pain or moderate pain.   aspirin EC 325 MG tablet Take 325 mg by mouth daily.   CALCIUM PO Take 1 tablet by mouth daily.   Empagliflozin-metFORMIN HCl (SYNJARDY) 12.02-999 MG TABS Take 1 tablet by mouth 2 (two) times daily.   enoxaparin (LOVENOX) 40 MG/0.4ML injection Inject 0.4 mLs (40 mg total) into the skin daily for 14 days.   HYDROcodone-acetaminophen (NORCO) 5-325 MG tablet Take 1 tablet by mouth every 6 (six) hours as needed.   JANUVIA 25 MG tablet Take 25 mg by mouth daily.   NON FORMULARY Diet: Dysphagia 3 (puree) diet with thin liquids   polyethylene glycol (MIRALAX / GLYCOLAX) 17 g packet Take 17 g by mouth daily as needed for moderate constipation or severe constipation.   No facility-administered encounter medications on file as of 01/16/2023.     SIGNIFICANT DIAGNOSTIC EXAMS       ASSESSMENT/ PLAN:     Ok Edwards NP [facility]  Contact [phone] Monday through Friday 8am- 5pm  After hours call [phone]

## 2023-01-17 ENCOUNTER — Non-Acute Institutional Stay (SKILLED_NURSING_FACILITY): Payer: Medicare Other | Admitting: Internal Medicine

## 2023-01-17 ENCOUNTER — Encounter: Payer: Self-pay | Admitting: Internal Medicine

## 2023-01-17 DIAGNOSIS — E1149 Type 2 diabetes mellitus with other diabetic neurological complication: Secondary | ICD-10-CM | POA: Diagnosis not present

## 2023-01-17 DIAGNOSIS — S72012D Unspecified intracapsular fracture of left femur, subsequent encounter for closed fracture with routine healing: Secondary | ICD-10-CM | POA: Diagnosis not present

## 2023-01-17 DIAGNOSIS — S72012S Unspecified intracapsular fracture of left femur, sequela: Secondary | ICD-10-CM | POA: Diagnosis not present

## 2023-01-17 DIAGNOSIS — F0390 Unspecified dementia without behavioral disturbance: Secondary | ICD-10-CM

## 2023-01-17 NOTE — Progress Notes (Signed)
Location:  [place] Room Number: [id number] Place of Service:  SNF (31)   CODE STATUS: DNR  Allergies  Allergen Reactions   Contrast Media [Iodinated Contrast Media] Swelling   Metrizamide Swelling    Chief Complaint  Patient presents with   Hospitalization Follow-up    HPI:  She is a 89 year old woman who has been hospitalized from 01-06-23 through 03-15-23.her medical history includes: significant hearing loss; advanced dementia; schizophrenia/psychosis; diabetes mellitus type 2. She has a history of recurrent falls. She presented to the ED with sudden onset of left hip pain. She was found to have left subcapital left femoral neck fracture. She underwent hemiarthroplasty on 01-06-23. She had an uneventful hospitalization. She is here for short term rehab with her goal to return back home; however this may not be possible. There are no reports of pain present. 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 Gala Romney: tubular adenomas/rectal   COLONOSCOPY N/A 01/06/2014   [id number] diverticulosis. Colonic polyp-removed as described above. Status post segmental biopsy and stool collection for culture   HERNIA REPAIR     umbilical, [facility]   HIP ARTHROPLASTY Right 04/22/2022   Procedure: ARTHROPLASTY BIPOLAR HIP (HEMIARTHROPLASTY);  Surgeon: Altamese Alma, MD;  Location: [place];  Service: Orthopedics;  Laterality: Right;   HIP ARTHROPLASTY Left 01/06/2023   Procedure: LEFT HIP HEMIARTHROPLASTY;  Surgeon: Leandrew Koyanagi, MD;  Location: [place];  Service: Orthopedics;  Laterality: Left;   HYSTEROSCOPY WITH D & C  05/23/2012    Procedure: DILATATION AND CURETTAGE /HYSTEROSCOPY;  Surgeon: Florian Buff, MD;  Location: [facility];  Service: Gynecology;  Laterality: N/A;   POLYPECTOMY  05/23/2012   Procedure: POLYPECTOMY;  Surgeon: Florian Buff, MD;  Location: [facility];  Service: Gynecology;  Laterality: N/A;  endometrial polypectomy    Social History   Socioeconomic History   Marital status: Married    Spouse name: Not on file   Number of children: Not on file   Years of education: Not on file   Highest education level: Not on file  Occupational History   Not on file  Tobacco Use   Smoking status: Never   Smokeless tobacco: Never  Vaping Use   Vaping Use: Never used  Substance and Sexual Activity   Alcohol use: No   Drug use: No   Sexual activity: Not on file  Other Topics Concern   Not on file  Social History Narrative   Not on file   Social Determinants of Health   Financial Resource Strain: Not on file  Food Insecurity: Not on file  Transportation Needs: Not on file  Physical Activity: Not on file  Stress: Not on file  Social Connections: Not on file  Intimate Partner Violence: Not on file   Family History  Problem Relation Age of Onset   Colon cancer Neg Hx       VITAL SIGNS BP (!) 97/58   Pulse 96   Temp 98.6 F (37 C)   Resp 20   Ht 5\' 5"  (1.651 m)   Wt 127 lb (  57.6 kg)   SpO2 96%   BMI 21.13 kg/m   Outpatient Encounter Medications as of 01/16/2023  Medication Sig   acetaminophen (TYLENOL) 500 MG tablet Take 1 tablet (500 mg total) by mouth every 6 (six) hours as needed for mild pain or moderate pain.   aspirin EC 325 MG tablet Take 325 mg by mouth daily.   CALCIUM PO Take 1 tablet by mouth daily.   Empagliflozin-metFORMIN HCl (SYNJARDY) 12.02-999 MG TABS Take 1 tablet by mouth 2 (two) times daily.   enoxaparin (LOVENOX) 40 MG/0.4ML injection Inject 0.4 mLs (40 mg total) into the skin daily for 14 days.   HYDROcodone-acetaminophen (NORCO) 5-325 MG tablet Take 1 tablet by mouth  every 6 (six) hours as needed.   JANUVIA 25 MG tablet Take 25 mg by mouth daily.   NON FORMULARY Diet: Dysphagia 3 (puree) diet with thin liquids   polyethylene glycol (MIRALAX / GLYCOLAX) 17 g packet Take 17 g by mouth daily as needed for moderate constipation or severe constipation.   No facility-administered encounter medications on file as of 01/16/2023.     SIGNIFICANT DIAGNOSTIC EXAMS  PREVIOUS   04-19-22: left hip and  pelvic x-ray:  Moderately displaced proximal right femoral neck fracture. Moderately displaced left superior pubis ramus fracture.  NO NEW EXAMS.   LABS REVIEWED PREVIOUS   04-19-22: wbc 10.8; hgb 13.1; hct 39.7; mcv 89.2 plt 204; glucose 155; bun 19; creat 0.82; k+ 4.1; na++ 136; ca 9.3; gfr >60 protein 6.4; albumin 3.8 04-22-22: wbc 6.7; hgb 13.5; hct 41.2; mcv 90.5 plt 237; glucose 174; bun 28; creat 1.19; k+ 4.5; na++ 133; ca 9.0; gfr 44; protein 6.5; albumin 3.4; total bili 1.3 vit D 42.05 04-24-22: wbc 6.6; hgb 11.7; hct 36.2; mcv 89.4 plt 189; glucose 210; bun 16; creat 0.76; k+ 4.6; na++ 133; ca 8.6; gfr >60  05-13-24: wbc 11.8; hgb 12.4; hct 37.6; mcv 88.5 plt 245 05-23-22: hgb a1c 8.9    TODAY  01-06-23: wbc 5.2; hgb 13.3; hct 40.9; mcv 88.1 plt 204; glucose 258; bun 28; creat 1.01; k+ 4.3; na++ 134; ca 8.9; gfr 53; hgb A1c 9.7 01-08-23; wbc 7.7; hgb 10.7; hct 31.5; mcv 88.5 plt 146; glucose 135; bun 15; creat 0.79; k+ 3.7; na++ 134; ca 8.5; gfr >60; mag 1.5 01-11-23: wbc 5.4; hgb 10.4; hct 30.6; mcv 86.2 plt 212; glucose 230; bun 14; creat 0.58; k+ 4.4; na++ 131; ca 8.3; gfr >60 mag 1.9 01-13-23: wbc 5.1; hgb 10.9; hct 32.9; mcv 86.1 plt 293; glucose 235; bun 16; creat 0.63; k+ 4.3; na++ 131; ca 8.7; gfr >60  mag 1.6   Review of Systems  Reason unable to perform ROS: unable to participate very HOH and poor vision.   Physical Exam Constitutional:      General: She is not in acute distress.    Appearance: She is well-developed. She is not diaphoretic.   Neck:     Thyroid: No thyromegaly.  Cardiovascular:     Rate and Rhythm: Normal rate and regular rhythm.     Pulses: Normal pulses.     Heart sounds: Normal heart sounds.  Pulmonary:     Effort: Pulmonary effort is normal. No respiratory distress.     Breath sounds: Normal breath sounds.  Abdominal:     General: Bowel sounds are normal. There is no distension.     Palpations: Abdomen is soft.     Tenderness: There is no abdominal tenderness.  Musculoskeletal:  Cervical back: Neck supple.     Right lower leg: No edema.     Left lower leg: No edema.     Comments: Left hemiarthroplasty 01-06-23 Right hip hemiarthroplasty 04-22-22  left superior pubis ramus fracture.   Able to move all extremities     Lymphadenopathy:     Cervical: No cervical adenopathy.  Skin:    General: Skin is warm and dry.     Comments: Incision line without indications of infection   Neurological:     Mental Status: She is alert. Mental status is at baseline.  Psychiatric:        Mood and Affect: Mood normal.        ASSESSMENT/ PLAN:  TODAY  Closed subcapital of neck of femur, left sequela:  will continue therapy as directed; will follow up with orthopedics; will continue lovenox 40 mg daily for 14 days. Will continue vicodin 5/325 mg every 6 hours as needed through 01-18-23. Will continue to monitor her status.   2. Aortic atherosclerosis (ct 03-18-21) not on statin due to advanced age; asa 325 mg daily   3. 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 Dementia without behavioral disturbance: weight is 127 pounds will monitor   5. Schizophrenia unspecified type: is presently not on medications will monitor  6. Moderate protein calorie malnutrition: albumin 3.0. will continue supplements as directed  7. Chronic constipation: will continue miralax daily as needed    Will check hgb/hct bmp mag levels    Ok Edwards NP [facility]  call [phone]

## 2023-01-17 NOTE — Patient Instructions (Signed)
See assessment and plan under each diagnosis in the problem list and acutely for this visit 

## 2023-01-17 NOTE — Assessment & Plan Note (Signed)
She does realize that she "broke my hip last time."  Otherwise she can provide no meaningful history and responses are unfocused and suggest confabulation.

## 2023-01-17 NOTE — Assessment & Plan Note (Signed)
Orthopedic follow-up as scheduled.  PT/OT at SNF as tolerated.  Her dementia will greatly compromise her ability to complete rehab.

## 2023-01-17 NOTE — Progress Notes (Signed)
NURSING HOME LOCATION:  [facility] ROOM NUMBER:  129 P  CODE STATUS:  DNR  PCP: [facility]  This is a comprehensive admission note to this SNFperformed on this date less than 30 days from date of admission. Included are preadmission medical/surgical history; reconciled medication list; family history; social history and comprehensive review of systems.  Corrections and additions to the records were documented. Comprehensive physical exam was also performed. Additionally a clinical summary was entered for each active diagnosis pertinent to this admission in the Problem List to enhance continuity of care.  HPI: She was hospitalized 3/8 - 01/13/2023 presenting with left hip pain in the context of recurrent falls.  Imaging revealed subcapital left femoral neck fracture.  Left hip hemiarthroplasty was performed 3/8.  Lovenox prophylaxis was initiated.  Norco was initiated for pain control.  Course was complicated by ileus. Initial creatinine was 1.01 with a GFR of 53; final values were 0.63 and greater than 60.  H/H preop was 13.3/40.9 with nadir H/H of 10.0/29.8.  Final hemoglobin was 10.9. A1c was 9.7%.  She was discharged on empagliflozin-metformin 12.02-999 mg twice daily and Januvia 25 mg daily. PT/OT recommended SNF placement for rehab.  Past medical and surgical history: Includes history of schizophrenia with psychoses; advanced dementia; diabetes with neurologic complications; bilateral sensorineural hearing loss; and dementia. Surgeries and procedures include colonoscopy with polypectomy, hernia repair, and right hip arthroplasty 04/22/2022.  She was also had hysteroscopy with D&C.  Social history: Nondrinker; non-smoker.  Family history: Noncontributory due to advanced age.   Review of systems: Clinical neurocognitive deficits made validity of responses questionable & preventing ROS completion.  She did realize that she "broke hip last time."  Other than that  statement her responses were unfocused.  She stated that she has "low blood" which is the reason she has a blanket over her.  She went on to complain that she is receiving "too many pills stomach" and "the last girl came out yelling."  She then told me 3 times that "they tore my heel up with exercise on the brake."  Physical exam:  Pertinent or positive findings: She was initially asleep exhibiting hypopnea without snoring or frank apnea.  Hair is thin and come back.  She did awaken easily.  Upon awakening she exhibited blank facies.  She is profoundly deaf and all responses are essentially screamed at the examiner.  Facies are weathered.  She has bilateral ptosis.  Eyebrows are essentially absent.  She has 2 lower teeth; the maxilla is a dentulous.  Breath sounds are decreased.  She exhibits a slight gallop cadence.  Abdomen is protuberant.  Pedal pulses are not palpable. General appearance: Adequately nourished; no acute distress, increased work of breathing is present.   Lymphatic: No lymphadenopathy about the head, neck, axilla. Eyes: No conjunctival inflammation or lid edema is present. There is no scleral icterus. Ears:  External ear exam shows no significant lesions or deformities.   Nose:  External nasal examination shows no deformity or inflammation. Nasal mucosa are pink and moist without lesions, exudates Oral exam: Lips and gums are healthy appearing.There is no oropharyngeal erythema or exudate. Neck:  No thyromegaly, masses, tenderness noted.    Heart:  Normal rate and regular rhythm. S1 and S2 normal without gallop, murmur, click, rub.  Lungs: Chest clear to auscultation without wheezes, rhonchi, rales, rubs. Abdomen: Bowel sounds are normal.  Abdomen is soft and nontender with no organomegaly, hernias, masses. GU: Deferred  Extremities:  No cyanosis, clubbing, edema. Neurologic exam:  Strength equal  in upper & lower extremities. Balance, Rhomberg, finger to nose testing could not be  completed due to clinical state Deep tendon reflexes are equal Skin: Warm & dry w/o tenting. No significant lesions or rash.  See clinical summary under each active problem in the Problem List with associated updated therapeutic plan

## 2023-01-17 NOTE — Progress Notes (Deleted)
Location:  [place] Room Number: [id number] Place of Service:  SNF (31)   CODE STATUS: DNR  Allergies  Allergen Reactions   Contrast Media [Iodinated Contrast Media] Swelling   Metrizamide Swelling    Chief Complaint  Patient presents with   Hospitalization Follow-up    HPI:  She is a 89 year old woman who has been hospitalized from 01-06-23 through 03-15-23.her medical history includes: significant hearing loss; advanced dementia; schizophrenia/psychosis; diabetes mellitus type 2. She has a history of recurrent falls. She presented to the ED with sudden onset of left hip pain. She was found to have left subcapital left femoral neck fracture. She underwent hemiarthroplasty on 01-06-23. She had an uneventful hospitalization. She is here for short term rehab with her goal to return back home; however this may not be possible. There are no reports of pain present. 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 Gala Romney: tubular adenomas/rectal   COLONOSCOPY N/A 01/06/2014   [id number] diverticulosis. Colonic polyp-removed as described above. Status post segmental biopsy and stool collection for culture   HERNIA REPAIR     umbilical, [facility]   HIP ARTHROPLASTY Right 04/22/2022   Procedure: ARTHROPLASTY BIPOLAR HIP (HEMIARTHROPLASTY);  Surgeon: Altamese [place], MD;  Location: [place];  Service: Orthopedics;  Laterality: Right;   HIP ARTHROPLASTY Left 01/06/2023   Procedure: LEFT HIP HEMIARTHROPLASTY;  Surgeon: Leandrew Koyanagi, MD;  Location: [place];  Service: Orthopedics;  Laterality: Left;   HYSTEROSCOPY WITH D & C  05/23/2012    Procedure: DILATATION AND CURETTAGE /HYSTEROSCOPY;  Surgeon: Florian Buff, MD;  Location: [facility];  Service: Gynecology;  Laterality: N/A;   POLYPECTOMY  05/23/2012   Procedure: POLYPECTOMY;  Surgeon: Florian Buff, MD;  Location: [facility];  Service: Gynecology;  Laterality: N/A;  endometrial polypectomy    Social History   Socioeconomic History   Marital status: Married    Spouse name: Not on file   Number of children: Not on file   Years of education: Not on file   Highest education level: Not on file  Occupational History   Not on file  Tobacco Use   Smoking status: Never   Smokeless tobacco: Never  Vaping Use   Vaping Use: Never used  Substance and Sexual Activity   Alcohol use: No   Drug use: No   Sexual activity: Not on file  Other Topics Concern   Not on file  Social History Narrative   Not on file   Social Determinants of Health   Financial Resource Strain: Not on file  Food Insecurity: Not on file  Transportation Needs: Not on file  Physical Activity: Not on file  Stress: Not on file  Social Connections: Not on file  Intimate Partner Violence: Not on file   Family History  Problem Relation Age of Onset   Colon cancer Neg Hx       VITAL SIGNS BP (!) 97/58   Pulse 96   Temp 98.6 F (37 C)   Resp 20   Ht 5\' 5"  (1.651 m)   Wt 127 lb (  57.6 kg)   SpO2 96%   BMI 21.13 kg/m   Outpatient Encounter Medications as of 01/16/2023  Medication Sig   acetaminophen (TYLENOL) 500 MG tablet Take 1 tablet (500 mg total) by mouth every 6 (six) hours as needed for mild pain or moderate pain.   aspirin EC 325 MG tablet Take 325 mg by mouth daily.   CALCIUM PO Take 1 tablet by mouth daily.   Empagliflozin-metFORMIN HCl (SYNJARDY) 12.02-999 MG TABS Take 1 tablet by mouth 2 (two) times daily.   enoxaparin (LOVENOX) 40 MG/0.4ML injection Inject 0.4 mLs (40 mg total) into the skin daily for 14 days.   HYDROcodone-acetaminophen (NORCO) 5-325 MG tablet Take 1 tablet by mouth  every 6 (six) hours as needed.   JANUVIA 25 MG tablet Take 25 mg by mouth daily.   NON FORMULARY Diet: Dysphagia 3 (puree) diet with thin liquids   polyethylene glycol (MIRALAX / GLYCOLAX) 17 g packet Take 17 g by mouth daily as needed for moderate constipation or severe constipation.   No facility-administered encounter medications on file as of 01/16/2023.     SIGNIFICANT DIAGNOSTIC EXAMS  PREVIOUS   04-19-22: left hip and  pelvic x-ray:  Moderately displaced proximal right femoral neck fracture. Moderately displaced left superior pubis ramus fracture.  NO NEW EXAMS.   LABS REVIEWED PREVIOUS   04-19-22: wbc 10.8; hgb 13.1; hct 39.7; mcv 89.2 plt 204; glucose 155; bun 19; creat 0.82; k+ 4.1; na++ 136; ca 9.3; gfr >60 protein 6.4; albumin 3.8 04-22-22: wbc 6.7; hgb 13.5; hct 41.2; mcv 90.5 plt 237; glucose 174; bun 28; creat 1.19; k+ 4.5; na++ 133; ca 9.0; gfr 44; protein 6.5; albumin 3.4; total bili 1.3 vit D 42.05 04-24-22: wbc 6.6; hgb 11.7; hct 36.2; mcv 89.4 plt 189; glucose 210; bun 16; creat 0.76; k+ 4.6; na++ 133; ca 8.6; gfr >60  05-13-24: wbc 11.8; hgb 12.4; hct 37.6; mcv 88.5 plt 245 05-23-22: hgb a1c 8.9    TODAY  01-06-23: wbc 5.2; hgb 13.3; hct 40.9; mcv 88.1 plt 204; glucose 258; bun 28; creat 1.01; k+ 4.3; na++ 134; ca 8.9; gfr 53; hgb A1c 9.7 01-08-23; wbc 7.7; hgb 10.7; hct 31.5; mcv 88.5 plt 146; glucose 135; bun 15; creat 0.79; k+ 3.7; na++ 134; ca 8.5; gfr >60; mag 1.5 01-11-23: wbc 5.4; hgb 10.4; hct 30.6; mcv 86.2 plt 212; glucose 230; bun 14; creat 0.58; k+ 4.4; na++ 131; ca 8.3; gfr >60 mag 1.9 01-13-23: wbc 5.1; hgb 10.9; hct 32.9; mcv 86.1 plt 293; glucose 235; bun 16; creat 0.63; k+ 4.3; na++ 131; ca 8.7; gfr >60  mag 1.6   Review of Systems  Reason unable to perform ROS: unable to participate very HOH and poor vision.   Physical Exam Constitutional:      General: She is not in acute distress.    Appearance: She is well-developed. She is not diaphoretic.   Neck:     Thyroid: No thyromegaly.  Cardiovascular:     Rate and Rhythm: Normal rate and regular rhythm.     Pulses: Normal pulses.     Heart sounds: Normal heart sounds.  Pulmonary:     Effort: Pulmonary effort is normal. No respiratory distress.     Breath sounds: Normal breath sounds.  Abdominal:     General: Bowel sounds are normal. There is no distension.     Palpations: Abdomen is soft.     Tenderness: There is no abdominal tenderness.  Musculoskeletal:  Cervical back: Neck supple.     Right lower leg: No edema.     Left lower leg: No edema.     Comments: Left hemiarthroplasty 01-06-23 Right hip hemiarthroplasty 04-22-22  left superior pubis ramus fracture.   Able to move all extremities     Lymphadenopathy:     Cervical: No cervical adenopathy.  Skin:    General: Skin is warm and dry.     Comments: Incision line without indications of infection   Neurological:     Mental Status: She is alert. Mental status is at baseline.  Psychiatric:        Mood and Affect: Mood normal.        ASSESSMENT/ PLAN:  TODAY  Closed subcapital of neck of femur, left sequela:  will continue therapy as directed; will follow up with orthopedics; will continue lovenox 40 mg daily for 14 days. Will continue vicodin 5/325 mg every 6 hours as needed through 01-18-23. Will continue to monitor her status.   2. Aortic atherosclerosis (ct 03-18-21) not on statin due to advanced age; asa 325 mg daily   3. 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 Dementia without behavioral disturbance: weight is 127 pounds will monitor   5. Schizophrenia unspecified type: is presently not on medications will monitor  6. Moderate protein calorie malnutrition: albumin 3.0. will continue supplements as directed  7. Chronic constipation: will continue miralax daily as needed    Will check hgb/hct bmp mag levels    Ok Edwards NP [facility]  call [phone]

## 2023-01-17 NOTE — Assessment & Plan Note (Addendum)
Current A1c is 9.7% indicating poor control.  She has been discharged on empagliflozin-metformin 12.02/999 g twice a day and Januvia 25 mg daily.   Her dementia in validates her responses; her GI complaints may reflect intolerance to the diabetic medications.  She is also on full dose aspirin but no other agents which might cause GI complaints. Glucose monitor at the SNF.  Adjust doses of 3 agents based on glucose response and GI complaints.

## 2023-01-19 ENCOUNTER — Other Ambulatory Visit (HOSPITAL_COMMUNITY)
Admission: RE | Admit: 2023-01-19 | Discharge: 2023-01-19 | Disposition: A | Discharge status: Home / Self Care | Payer: Medicare Other | Source: Skilled Nursing Facility | Attending: Adult Health | Admitting: Adult Health

## 2023-01-19 DIAGNOSIS — S72002A Fracture of unspecified part of neck of left femur, initial encounter for closed fracture: Secondary | ICD-10-CM | POA: Insufficient documentation

## 2023-01-19 LAB — BASIC METABOLIC PANEL
Anion gap: 11 (ref 5–15)
BUN: 20 mg/dL (ref 8–23)
CO2: 27 mmol/L (ref 22–32)
Calcium: 8.7 mg/dL — ABNORMAL LOW (ref 8.9–10.3)
Chloride: 95 mmol/L — ABNORMAL LOW (ref 98–111)
Creatinine, Ser: 0.63 mg/dL (ref 0.44–1.00)
GFR, Estimated: >60 mL/min (ref >60)
Glucose, Bld: 198 mg/dL — ABNORMAL HIGH (ref 70–99)
Potassium: 4.4 mmol/L (ref 3.5–5.1)
Sodium: 133 mmol/L — ABNORMAL LOW (ref 135–145)

## 2023-01-19 LAB — MAGNESIUM: Magnesium: 1.7 mg/dL (ref 1.7–2.4)

## 2023-01-19 LAB — HEMOGLOBIN AND HEMATOCRIT, BLOOD
HCT: 34.6 % — ABNORMAL LOW (ref 36.0–46.0)
Hemoglobin: 11.1 g/dL — ABNORMAL LOW (ref 12.0–15.0)

## 2023-01-31 ENCOUNTER — Encounter: Payer: Self-pay | Admitting: Adult Health

## 2023-01-31 ENCOUNTER — Other Ambulatory Visit: Payer: Self-pay | Admitting: Adult Health

## 2023-01-31 ENCOUNTER — Non-Acute Institutional Stay (SKILLED_NURSING_FACILITY): Payer: Medicare Other | Admitting: Adult Health

## 2023-01-31 DIAGNOSIS — S72012D Unspecified intracapsular fracture of left femur, subsequent encounter for closed fracture with routine healing: Secondary | ICD-10-CM

## 2023-01-31 DIAGNOSIS — S72012S Unspecified intracapsular fracture of left femur, sequela: Secondary | ICD-10-CM

## 2023-01-31 DIAGNOSIS — F0390 Unspecified dementia without behavioral disturbance: Secondary | ICD-10-CM

## 2023-01-31 DIAGNOSIS — E440 Moderate protein-calorie malnutrition: Secondary | ICD-10-CM

## 2023-01-31 MED ORDER — NOVOLOG FLEXPEN 100 UNIT/ML ~~LOC~~ SOPN: 5.0000 [IU] | PEN_INJECTOR | Freq: Three times a day (TID) | SUBCUTANEOUS | 0 refills | Status: DC

## 2023-01-31 MED ORDER — SYNJARDY 12.5-1000 MG PO TABS: 1.0000 | ORAL_TABLET | Freq: Two times a day (BID) | ORAL | 0 refills | Status: DC

## 2023-01-31 MED ORDER — ONDANSETRON 4 MG PO TBDP: 4.0000 mg | ORAL_TABLET | Freq: Four times a day (QID) | ORAL | 0 refills | Status: DC | PRN

## 2023-01-31 NOTE — Progress Notes (Signed)
Location:  [facility] Room Number: 129-P Place of Service:  SNF (31)   CODE STATUS: DNR  Allergies  Allergen Reactions   Contrast Media [Iodinated Contrast Media] Swelling   Metrizamide Swelling    This is a nonionic iodine-based radiocontrast agent.    Chief Complaint  Patient presents with   Discharge Note    Discharge from [facility]    HPI:  She is being discharged to home with home health for pt/ot. She will not need any dme. She will need her prescriptions written and will need to follow up with her medical provider. She had been hospitalized for a left hip fracture. She was admitted to this facility for short term rehab. Therapy: bed mobility; supervision; stand/pivot: supervision. Ambulate with rolling walker 115 feet with contact guard assist; upper body: supervision; lower body mod assist; brp: min assist. Has mod to severe cognitive impairment. She does have care givers at home.   Past Medical History:  Diagnosis Date   Diabetes mellitus    HOH (hard of hearing)    Poor historian    Sinus trouble     Past Surgical History:  Procedure Laterality Date   COLONOSCOPY  2007   Dr. Jena Gauss: tubular adenomas/rectal   COLONOSCOPY N/A 01/06/2014   [id number] diverticulosis. Colonic polyp-removed as described above. Status post segmental biopsy and stool collection for culture   HERNIA REPAIR     umbilical, [facility]   HIP ARTHROPLASTY Right 04/22/2022   Procedure: ARTHROPLASTY BIPOLAR HIP (HEMIARTHROPLASTY);  Surgeon: Myrene Galas, MD;  Location: [facility] OR;  Service: Orthopedics;  Laterality: Right;   HIP ARTHROPLASTY Left 01/06/2023   Procedure: LEFT HIP HEMIARTHROPLASTY;  Surgeon: Tarry Kos, MD;  Location: [facility];  Service: Orthopedics;  Laterality: Left;   HYSTEROSCOPY WITH D & C  05/23/2012   Procedure: DILATATION AND CURETTAGE /HYSTEROSCOPY;  Surgeon: Lazaro Arms, MD;  Location: [facility];  Service: Gynecology;  Laterality: N/A;   POLYPECTOMY   05/23/2012   Procedure: POLYPECTOMY;  Surgeon: Lazaro Arms, MD;  Location: [facility];  Service: Gynecology;  Laterality: N/A;  endometrial polypectomy    Social History   Socioeconomic History   Marital status: Married    Spouse name: Not on file   Number of children: Not on file   Years of education: Not on file   Highest education level: Not on file  Occupational History   Not on file  Tobacco Use   Smoking status: Never   Smokeless tobacco: Never  Vaping Use   Vaping Use: Never used  Substance and Sexual Activity   Alcohol use: No   Drug use: No   Sexual activity: Not on file  Other Topics Concern   Not on file  Social History Narrative   Not on file   Social Determinants of Health   Financial Resource Strain: Not on file  Food Insecurity: Not on file  Transportation Needs: Not on file  Physical Activity: Not on file  Stress: Not on file  Social Connections: Not on file  Intimate Partner Violence: Not on file   Family History  Problem Relation Age of Onset   Colon cancer Neg Hx       VITAL SIGNS BP 113/75   Pulse 100   Ht 5\' 5"  (1.651 m)   Wt 127 lb (57.6 kg)   SpO2 99%   BMI 21.13 kg/m   Outpatient Encounter Medications as of 01/31/2023  Medication Sig   acetaminophen (TYLENOL) 500  MG tablet Take 1 tablet (500 mg total) by mouth every 6 (six) hours as needed for mild pain or moderate pain.   aspirin EC 325 MG tablet Take 325 mg by mouth daily.   CALCIUM PO Take 1 tablet by mouth daily.   docusate sodium (COLACE) 100 MG capsule Take 100 mg by mouth 2 (two) times daily as needed for mild constipation.   Empagliflozin-metFORMIN HCl (SYNJARDY) 12.02-999 MG TABS Take 1 tablet by mouth 2 (two) times daily.   insulin aspart (NOVOLOG FLEXPEN) 100 UNIT/ML FlexPen Inject 5 Units into the skin 3 (three) times daily with meals.   ondansetron (ZOFRAN-ODT) 4 MG disintegrating tablet Take 4 mg by mouth every 6 (six) hours as needed for nausea or vomiting.    polyethylene glycol (MIRALAX / GLYCOLAX) 17 g packet Take 17 g by mouth daily as needed for moderate constipation or severe constipation.   [DISCONTINUED] enoxaparin (LOVENOX) 40 MG/0.4ML injection Inject 0.4 mLs (40 mg total) into the skin daily for 14 days.   [DISCONTINUED] HYDROcodone-acetaminophen (NORCO) 5-325 MG tablet Take 1 tablet by mouth every 6 (six) hours as needed.   [DISCONTINUED] JANUVIA 25 MG tablet Take 25 mg by mouth daily.   [DISCONTINUED] NON FORMULARY Diet: Dysphagia 3 (puree) diet with thin liquids   No facility-administered encounter medications on file as of 01/31/2023.     SIGNIFICANT DIAGNOSTIC EXAMS  PREVIOUS   04-19-22: left hip and  pelvic x-ray:  Moderately displaced proximal right femoral neck fracture. Moderately displaced left superior pubis ramus fracture.  NO NEW EXAMS.   LABS REVIEWED PREVIOUS   04-19-22: wbc 10.8; hgb 13.1; hct 39.7; mcv 89.2 plt 204; glucose 155; bun 19; creat 0.82; k+ 4.1; na++ 136; ca 9.3; gfr >60 protein 6.4; albumin 3.8 04-22-22: wbc 6.7; hgb 13.5; hct 41.2; mcv 90.5 plt 237; glucose 174; bun 28; creat 1.19; k+ 4.5; na++ 133; ca 9.0; gfr 44; protein 6.5; albumin 3.4; total bili 1.3 vit D 42.05 04-24-22: wbc 6.6; hgb 11.7; hct 36.2; mcv 89.4 plt 189; glucose 210; bun 16; creat 0.76; k+ 4.6; na++ 133; ca 8.6; gfr >60  05-13-24: wbc 11.8; hgb 12.4; hct 37.6; mcv 88.5 plt 245 05-23-22: hgb a1c 8.9   01-06-23: wbc 5.2; hgb 13.3; hct 40.9; mcv 88.1 plt 204; glucose 258; bun 28; creat 1.01; k+ 4.3; na++ 134; ca 8.9; gfr 53; hgb A1c 9.7 01-08-23; wbc 7.7; hgb 10.7; hct 31.5; mcv 88.5 plt 146; glucose 135; bun 15; creat 0.79; k+ 3.7; na++ 134; ca 8.5; gfr >60; mag 1.5 01-11-23: wbc 5.4; hgb 10.4; hct 30.6; mcv 86.2 plt 212; glucose 230; bun 14; creat 0.58; k+ 4.4; na++ 131; ca 8.3; gfr >60 mag 1.9 01-13-23: wbc 5.1; hgb 10.9; hct 32.9; mcv 86.1 plt 293; glucose 235; bun 16; creat 0.63; k+ 4.3; na++ 131; ca 8.7; gfr >60  mag 1.6   NO NEW LABS.    Review of Systems  Reason unable to perform ROS: unable to fully participate very HOH and poor vision.   Physical Exam Constitutional:      General: She is not in acute distress.    Appearance: She is well-developed. She is not diaphoretic.  Neck:     Thyroid: No thyromegaly.  Cardiovascular:     Rate and Rhythm: Normal rate and regular rhythm.     Pulses: Normal pulses.     Heart sounds: Normal heart sounds.  Pulmonary:     Effort: Pulmonary effort is normal. No respiratory distress.  Breath sounds: Normal breath sounds.  Abdominal:     General: Bowel sounds are normal. There is no distension.     Palpations: Abdomen is soft.     Tenderness: There is no abdominal tenderness.  Musculoskeletal:     Cervical back: Neck supple.     Right lower leg: No edema.     Left lower leg: No edema.     Comments: Left hemiarthroplasty 01-06-23 Right hip hemiarthroplasty 04-22-22  left superior pubis ramus fracture.   Able to move all extremities  Lymphadenopathy:     Cervical: No cervical adenopathy.  Skin:    General: Skin is warm and dry.  Neurological:     Mental Status: She is alert. Mental status is at baseline.  Psychiatric:        Mood and Affect: Mood normal.       ASSESSMENT/ PLAN:   Patient is being discharged with the following home health services:  pt/ot to evaluate and treat as indicated for gait balance strength adl training.   Patient is being discharged with the following durable medical equipment:  none needed   Patient has been advised to f/u with their PCP in 1-2 weeks to for a transitions of care visit.  Social services at their facility was responsible for arranging this appointment.  Pt was provided with adequate prescriptions of noncontrolled medications to reach the scheduled appointment .  For controlled substances, a limited supply was provided as appropriate for the individual patient.  If the pt normally receives these medications from a pain clinic  or has a contract with another physician, these medications should be received from that clinic or physician only).    A 30 day supply of her prescription medications have been sent to: [facility]  Time spent with patient 40 minutes: dme; home health; medications.   Synthia Innocent NP [facility]   call [phone]

## 2023-02-02 ENCOUNTER — Encounter: Payer: Medicare Other | Admitting: Orthopaedic Surgery

## 2023-02-03 ENCOUNTER — Telehealth: Payer: Self-pay | Admitting: Orthopaedic Surgery

## 2023-02-03 NOTE — Telephone Encounter (Signed)
Received call from Candice Ray advised patient is now in Hospice care and Hospice will remove her sutures and take over her care. The number to contact Erskine Squibb is needed is [phone]

## 2023-02-03 NOTE — Telephone Encounter (Signed)
Ok thanks 

## 2023-02-09 ENCOUNTER — Encounter: Payer: Medicare Other | Admitting: Orthopaedic Surgery

## 2023-02-28 ENCOUNTER — Emergency Department (HOSPITAL_COMMUNITY)

## 2023-02-28 ENCOUNTER — Emergency Department (HOSPITAL_COMMUNITY)
Admission: EM | Admit: 2023-02-28 | Discharge: 2023-02-28 | Disposition: A | Discharge status: Home / Self Care | Attending: Emergency Medicine | Admitting: Emergency Medicine

## 2023-02-28 ENCOUNTER — Encounter (HOSPITAL_COMMUNITY): Payer: Self-pay

## 2023-02-28 ENCOUNTER — Other Ambulatory Visit: Payer: Self-pay

## 2023-02-28 DIAGNOSIS — S99912A Unspecified injury of left ankle, initial encounter: Secondary | ICD-10-CM

## 2023-02-28 DIAGNOSIS — F0390 Unspecified dementia without behavioral disturbance: Secondary | ICD-10-CM | POA: Diagnosis not present

## 2023-02-28 DIAGNOSIS — R531 Weakness: Secondary | ICD-10-CM | POA: Insufficient documentation

## 2023-02-28 DIAGNOSIS — Z794 Long term (current) use of insulin: Secondary | ICD-10-CM | POA: Insufficient documentation

## 2023-02-28 DIAGNOSIS — W1839XA Other fall on same level, initial encounter: Secondary | ICD-10-CM | POA: Diagnosis not present

## 2023-02-28 DIAGNOSIS — E119 Type 2 diabetes mellitus without complications: Secondary | ICD-10-CM | POA: Insufficient documentation

## 2023-02-28 DIAGNOSIS — Z7982 Long term (current) use of aspirin: Secondary | ICD-10-CM | POA: Insufficient documentation

## 2023-02-28 DIAGNOSIS — S9002XA Contusion of left ankle, initial encounter: Secondary | ICD-10-CM | POA: Diagnosis not present

## 2023-02-28 DIAGNOSIS — Y92002 Bathroom of unspecified non-institutional (private) residence single-family (private) house as the place of occurrence of the external cause: Secondary | ICD-10-CM | POA: Diagnosis not present

## 2023-02-28 NOTE — ED Provider Notes (Signed)
[place] EMERGENCY DEPARTMENT AT [facility] Provider Note   CSN: [id number] Arrival date & time: 02/28/23  1511     History  Chief Complaint  Patient presents with   Ankle Pain    [place] Candice Ray is a 89 y.o. female.   Ankle Pain This patient is an 89 year old female history of dementia, history of diabetes on medications, presents after the hospice nurse recommended that she come to the hospital for ankle swelling.  Evidently the patient had a fall in the bathroom at some point, she was found on the floor of the bathroom by the patient's daughter-in-law who looks after her during the days.  She does not have any complaints other than ankle swelling.  She is on hospice because of fairly severe advanced chronic dementia and schizophrenia.  There has been no other complaints or concerns and when I asked the son if there is anything else they are worried about he says no.  She has chronic generalized weakness     Home Medications Prior to Admission medications   Medication Sig Start Date End Date Taking? Authorizing Provider  acetaminophen (TYLENOL) 500 MG tablet Take 1 tablet (500 mg total) by mouth every 6 (six) hours as needed for mild pain or moderate pain. 04/26/22   Montez Morita, PA-C  aspirin EC 325 MG tablet Take 325 mg by mouth daily.    [provider]  CALCIUM PO Take 1 tablet by mouth daily.    [provider]  docusate sodium (COLACE) 100 MG capsule Take 100 mg by mouth 2 (two) times daily as needed for mild constipation.    [provider]  Empagliflozin-metFORMIN HCl (SYNJARDY) 12.02-999 MG TABS Take 1 tablet by mouth 2 (two) times daily. 01/31/23   Sharee Holster, NP  insulin aspart (NOVOLOG FLEXPEN) 100 UNIT/ML FlexPen Inject 5 Units into the skin 3 (three) times daily with meals. 01/31/23   Sharee Holster, NP  ondansetron (ZOFRAN-ODT) 4 MG disintegrating tablet Take 1 tablet (4 mg total) by mouth every 6 (six) hours as needed for  nausea or vomiting. 01/31/23   Sharee Holster, NP  polyethylene glycol (MIRALAX / GLYCOLAX) 17 g packet Take 17 g by mouth daily as needed for moderate constipation or severe constipation. 04/26/22   Pokhrel, Rebekah Chesterfield, MD      Allergies    Contrast media [iodinated contrast media] and Metrizamide    Review of Systems   Review of Systems  All other systems reviewed and are negative.   Physical Exam Updated Vital Signs BP (!) 156/80 (BP Location: Right Arm)   Pulse (!) 108   Temp 97.6 F (36.4 C) (Oral)   Resp 18   Ht 1.651 m (5\' 5" )   Wt 57.6 kg   SpO2 94%   BMI 21.13 kg/m  Physical Exam Vitals and nursing note reviewed.  Constitutional:      General: She is not in acute distress.    Appearance: She is well-developed.  HENT:     Head: Normocephalic and atraumatic.     Mouth/Throat:     Pharynx: No oropharyngeal exudate.  Eyes:     General: No scleral icterus.       Right eye: No discharge.        Left eye: No discharge.     Conjunctiva/sclera: Conjunctivae normal.     Pupils: Pupils are equal, round, and reactive to light.  Neck:     Thyroid: No thyromegaly.  Vascular: No JVD.  Cardiovascular:     Rate and Rhythm: Normal rate and regular rhythm.     Heart sounds: Normal heart sounds. No murmur heard.    No friction rub. No gallop.  Pulmonary:     Effort: Pulmonary effort is normal. No respiratory distress.     Breath sounds: Normal breath sounds. No wheezing or rales.  Abdominal:     General: Bowel sounds are normal. There is no distension.     Palpations: Abdomen is soft. There is no mass.     Tenderness: There is no abdominal tenderness.  Musculoskeletal:        General: Swelling, tenderness and signs of injury present.     Cervical back: Normal range of motion and neck supple.     Right lower leg: No edema.     Left lower leg: No edema.     Comments: All 4 extremities are normal in appearance except for the left lower extremity which has swelling about the  ankle with mild bruising around the lateral malleolus.  No focal bony tenderness around the ankle or the foot.  The patient seems to have discomfort with range of motion of the ankle to inversion, eversion flexion and extension, no abnormality seen around the left knee with good range of motion.  Lymphadenopathy:     Cervical: No cervical adenopathy.  Skin:    General: Skin is warm and dry.     Findings: No erythema or rash.  Neurological:     Mental Status: She is alert.     Coordination: Coordination normal.  Psychiatric:        Behavior: Behavior normal.     ED Results / Procedures / Treatments   Labs (all labs ordered are listed, but only abnormal results are displayed) Labs Reviewed - No data to display  EKG None  Radiology DG Foot Complete Left  Result Date: 02/28/2023 CLINICAL DATA:  Left foot swelling. EXAM: LEFT FOOT - COMPLETE 3+ VIEW COMPARISON:  None Available. FINDINGS: There is no evidence of fracture or dislocation. There is no evidence of arthropathy or other focal bone abnormality. Soft tissues are unremarkable. IMPRESSION: Negative. Electronically Signed   By: Lupita Raider M.D.   On: 02/28/2023 15:44   DG Ankle Complete Left  Result Date: 02/28/2023 CLINICAL DATA:  Left ankle swelling. EXAM: LEFT ANKLE COMPLETE - 3+ VIEW COMPARISON:  None Available. FINDINGS: There is no evidence of fracture, dislocation, or joint effusion. There is no evidence of arthropathy or other focal bone abnormality. Mild lateral soft tissue swelling is noted. IMPRESSION: No fracture or dislocation.  Mild lateral soft tissue swelling. Electronically Signed   By: Lupita Raider M.D.   On: 02/28/2023 15:42    Procedures Procedures    Medications Ordered in ED Medications - No data to display  ED Course/ Medical Decision Making/ A&P                             Medical Decision Making Amount and/or Complexity of Data Reviewed Radiology: ordered.   Though this patient has had  some kind of fall is unclear whether she has had any other injuries but medically on my exam she has no other signs of trauma or tenderness or bruising other than the left ankle.  This will be imaged, she is on hospice, the son states she is at her baseline otherwise.  Vital signs unremarkable except for borderline tachycardia.  Imaging: I personally viewed and interpreted the x-rays of the foot and ankle, there is no signs of fractures or dislocations, there is some soft tissue swelling.  This fits with the patient's picture of likely ankle sprain, has family care nearly 24 hours a day and is stable for discharge at this time        Final Clinical Impression(s) / ED Diagnoses Final diagnoses:  Ankle injury, left, initial encounter    Rx / DC Orders ED Discharge Orders     None         Eber Hong, MD 02/28/23 1606

## 2023-02-28 NOTE — Discharge Instructions (Signed)
Your x-ray showed no signs of broken bones, try to stay off of the foot, we will put you in an Ace wrap, apply ice and elevate the foot is much as possible during the day.  See your doctor within 1 week for recheck  Apply a compressive ACE bandage. Rest and elevate the affected painful area.  Apply cold compresses intermittently as needed.  As pain recedes, begin normal activities slowly as tolerated.  Call if symptoms persist.  Thank you for allowing [place] to treat you in the emergency department today.  After reviewing your examination and potential testing that was done it appears that you are safe to go home.  I would like for you to follow-up with your doctor within the next several days, have them obtain your results and follow-up with them to review all of these tests.  If you should develop severe or worsening symptoms return to the emergency department immediately

## 2023-02-28 NOTE — ED Triage Notes (Signed)
Patient twisted left ankle which is swollen.  Patient has generalized weakness which is baseline per son.  Patient is under hospice care.

## 2023-03-02 ENCOUNTER — Emergency Department (HOSPITAL_COMMUNITY): Payer: Medicare Other

## 2023-03-02 ENCOUNTER — Encounter (HOSPITAL_COMMUNITY): Payer: Self-pay | Admitting: *Deleted

## 2023-03-02 ENCOUNTER — Emergency Department (HOSPITAL_COMMUNITY)
Admission: EM | Admit: 2023-03-02 | Discharge: 2023-03-02 | Disposition: A | Discharge status: Home / Self Care | Payer: Medicare Other | Attending: Student | Admitting: Student

## 2023-03-02 ENCOUNTER — Other Ambulatory Visit: Payer: Self-pay

## 2023-03-02 DIAGNOSIS — Z7984 Long term (current) use of oral hypoglycemic drugs: Secondary | ICD-10-CM | POA: Insufficient documentation

## 2023-03-02 DIAGNOSIS — E119 Type 2 diabetes mellitus without complications: Secondary | ICD-10-CM | POA: Diagnosis not present

## 2023-03-02 DIAGNOSIS — Z794 Long term (current) use of insulin: Secondary | ICD-10-CM | POA: Diagnosis not present

## 2023-03-02 DIAGNOSIS — Z7982 Long term (current) use of aspirin: Secondary | ICD-10-CM | POA: Insufficient documentation

## 2023-03-02 DIAGNOSIS — L03116 Cellulitis of left lower limb: Secondary | ICD-10-CM

## 2023-03-02 DIAGNOSIS — R2242 Localized swelling, mass and lump, left lower limb: Secondary | ICD-10-CM | POA: Diagnosis present

## 2023-03-02 LAB — BASIC METABOLIC PANEL
Anion gap: 9 (ref 5–15)
BUN: 11 mg/dL (ref 8–23)
CO2: 28 mmol/L (ref 22–32)
Calcium: 9.2 mg/dL (ref 8.9–10.3)
Chloride: 92 mmol/L — ABNORMAL LOW (ref 98–111)
Creatinine, Ser: 0.86 mg/dL (ref 0.44–1.00)
GFR, Estimated: >60 mL/min (ref >60)
Glucose, Bld: 289 mg/dL — ABNORMAL HIGH (ref 70–99)
Potassium: 4.5 mmol/L (ref 3.5–5.1)
Sodium: 129 mmol/L — ABNORMAL LOW (ref 135–145)

## 2023-03-02 LAB — CBC
HCT: 37.3 % (ref 36.0–46.0)
Hemoglobin: 11.9 g/dL — ABNORMAL LOW (ref 12.0–15.0)
MCH: 28.1 pg (ref 26.0–34.0)
MCHC: 31.9 g/dL (ref 30.0–36.0)
MCV: 88.2 fL (ref 80.0–100.0)
Platelets: 195 10*3/uL (ref 150–400)
RBC: 4.23 MIL/uL (ref 3.87–5.11)
RDW: 13.9 % (ref 11.5–15.5)
WBC: 5.9 10*3/uL (ref 4.0–10.5)
nRBC: 0 % (ref 0.0–0.2)

## 2023-03-02 MED ORDER — SODIUM CHLORIDE 0.9 % IV SOLN
1.0000 g | Freq: Once | INTRAVENOUS | Status: AC
  Administered 2023-03-02: 1 g via INTRAVENOUS
  Filled 2023-03-02: qty 10

## 2023-03-02 MED ORDER — CEPHALEXIN 500 MG PO CAPS: 500.0000 mg | ORAL_CAPSULE | Freq: Four times a day (QID) | ORAL | 0 refills | Status: DC

## 2023-03-02 NOTE — ED Triage Notes (Signed)
Pt BIB [facility] for fall today; pt was here 3 days ago for same complaint and xrays were done with no broken bones; pt fell again today and has pain and swelling and bruising to left ankle

## 2023-03-02 NOTE — ED Notes (Signed)
Patient transported to X-ray 

## 2023-03-02 NOTE — ED Provider Notes (Signed)
[place] EMERGENCY DEPARTMENT AT [facility] Provider Note   CSN: [id number] Arrival date & time: 03/02/23  1423     History  Chief Complaint  Patient presents with   Candice Ray is a 89 y.o. female with medical history of diabetes.  Patient returns to the ED for evaluation of left ankle swelling and pain.  Patient was seen 3 days ago for evaluation of left ankle secondary to fall.  At that time she had unremarkable plain film images.  The patient returns today with her daughter who states that last night the patient slid out of her chair from recliner.  The patient daughter reports that the patient has been complaining of increased pain to her left ankle after this occurred.  The patient daughter denies any fevers.  The patient daughter denies any nausea or vomiting at home.  She denies any medication prior to arrival.   [facility] Medications Prior to Admission medications   Medication Sig Start Date End Date Taking? Authorizing Provider  cephALEXin (KEFLEX) 500 MG capsule Take 1 capsule (500 mg total) by mouth 4 (four) times daily. 03/02/23  Yes Al Decant, PA-C  acetaminophen (TYLENOL) 500 MG tablet Take 1 tablet (500 mg total) by mouth every 6 (six) hours as needed for mild pain or moderate pain. 04/26/22   Montez Morita, PA-C  aspirin EC 325 MG tablet Take 325 mg by mouth daily.    [provider]  CALCIUM PO Take 1 tablet by mouth daily.    [provider]  docusate sodium (COLACE) 100 MG capsule Take 100 mg by mouth 2 (two) times daily as needed for mild constipation.    [provider]  Empagliflozin-metFORMIN HCl (SYNJARDY) 12.02-999 MG TABS Take 1 tablet by mouth 2 (two) times daily. 01/31/23   Sharee Holster, NP  insulin aspart (NOVOLOG FLEXPEN) 100 UNIT/ML FlexPen Inject 5 Units into the skin 3 (three) times daily with meals. 01/31/23   Sharee Holster, NP  ondansetron (ZOFRAN-ODT) 4 MG disintegrating tablet  Take 1 tablet (4 mg total) by mouth every 6 (six) hours as needed for nausea or vomiting. 01/31/23   Sharee Holster, NP  polyethylene glycol (MIRALAX / GLYCOLAX) 17 g packet Take 17 g by mouth daily as needed for moderate constipation or severe constipation. 04/26/22   Pokhrel, Rebekah Chesterfield, MD      Allergies    Contrast media [iodinated contrast media] and Metrizamide    Review of Systems   Review of Systems  Constitutional:  Negative for fever.  Musculoskeletal:  Positive for arthralgias and joint swelling.  Skin:  Positive for color change.  All other systems reviewed and are negative.   Physical Exam Updated Vital Signs BP 116/66   Pulse 95   Temp 98 F (36.7 C) (Oral)   Resp 17   Ht 5\' 5"  (1.651 m)   Wt 57 kg   SpO2 95%   BMI 20.91 kg/m  Physical Exam Vitals and nursing note reviewed.  Constitutional:      General: She is not in acute distress.    Appearance: Normal appearance. She is well-developed. She is not ill-appearing, toxic-appearing or diaphoretic.  HENT:     Head: Normocephalic and atraumatic.     Nose: Nose normal.     Mouth/Throat:     Mouth: Mucous membranes are moist.     Pharynx: Oropharynx is clear.  Eyes:  Conjunctiva/sclera: Conjunctivae normal.     Pupils: Pupils are equal, round, and reactive to light.  Cardiovascular:     Rate and Rhythm: Normal rate and regular rhythm.     Heart sounds: No murmur heard. Pulmonary:     Effort: Pulmonary effort is normal. No respiratory distress.     Breath sounds: Normal breath sounds.  Abdominal:     Palpations: Abdomen is soft.     Tenderness: There is no abdominal tenderness.  Musculoskeletal:        General: Swelling present.     Cervical back: Neck supple.     Left ankle: Swelling present. Tenderness present. Decreased range of motion.     Comments: Lateral soft tissue swelling about lateral malleolus, erythema, 2+ DP pulse in the foot, brisk cap refill  Skin:    General: Skin is warm and dry.      Capillary Refill: Capillary refill takes less than 2 seconds.  Neurological:     Mental Status: She is alert.          ED Results / Procedures / Treatments   Labs (all labs ordered are listed, but only abnormal results are displayed) Labs Reviewed  CBC - Abnormal; Notable for the following components:      Result Value   Hemoglobin 11.9 (*)    All other components within normal limits  BASIC METABOLIC PANEL - Abnormal; Notable for the following components:   Sodium 129 (*)    Chloride 92 (*)    Glucose, Bld 289 (*)    All other components within normal limits    EKG None  Radiology US Venous Img Lower  Left (DVT Study)  Result Date: 03/02/2023 CLINICAL DATA:  Left lower extremity pain and edema. Recent fall. Evaluate for DVT. EXAM: LEFT LOWER EXTREMITY VENOUS DOPPLER ULTRASOUND TECHNIQUE: Gray-scale sonography with graded compression, as well as color Doppler and duplex ultrasound were performed to evaluate the lower extremity deep venous systems from the level of the common femoral vein and including the common femoral, femoral, profunda femoral, popliteal and calf veins including the posterior tibial, peroneal and gastrocnemius veins when visible. The superficial great saphenous vein was also interrogated. Spectral Doppler was utilized to evaluate flow at rest and with distal augmentation maneuvers in the common femoral, femoral and popliteal veins. COMPARISON:  None Available. FINDINGS: Contralateral Common Femoral Vein: Respiratory phasicity is normal and symmetric with the symptomatic side. No evidence of thrombus. Normal compressibility. Common Femoral Vein: No evidence of thrombus. Normal compressibility, respiratory phasicity and response to augmentation. Saphenofemoral Junction: No evidence of thrombus. Normal compressibility and flow on color Doppler imaging. Profunda Femoral Vein: No evidence of thrombus. Normal compressibility and flow on color Doppler imaging. Femoral  Vein: No evidence of thrombus. Normal compressibility, respiratory phasicity and response to augmentation. Popliteal Vein: No evidence of thrombus. Normal compressibility, respiratory phasicity and response to augmentation. Calf Veins: Appear patent where visualized. Superficial Great Saphenous Vein: No evidence of thrombus. Normal compressibility. Other Findings:  None. IMPRESSION: No evidence of DVT within the left lower extremity. Electronically Signed   By: Simonne Come M.D.   On: 03/02/2023 16:38   DG Ankle Complete Left  Result Date: 03/02/2023 CLINICAL DATA:  Left ankle swelling and pain. EXAM: LEFT ANKLE COMPLETE - 3+ VIEW COMPARISON:  None Available. FINDINGS: There is no evidence of fracture, dislocation, or joint effusion. There is no evidence of arthropathy or other focal bone abnormality. Severe swelling of lateral soft tissues is noted with some gas  present suggesting possible cellulitis. IMPRESSION: Severe lateral soft tissue swelling is noted with some gas present suggesting cellulitis. No fracture or dislocation. Electronically Signed   By: Lupita Raider M.D.   On: 03/02/2023 16:35    Procedures Procedures   Medications Ordered in ED Medications  cefTRIAXone (ROCEPHIN) 1 g in sodium chloride 0.9 % 100 mL IVPB (1 g Intravenous New Bag/Given 03/02/23 1830)    ED Course/ Medical Decision Making/ A&P  Medical Decision Making Amount and/or Complexity of Data Reviewed Labs: ordered. Radiology: ordered.   89 year old female presents to the ED for evaluation.  Please see HPI for further details.  On my examination patient has lateral soft tissue swelling about the lateral malleolus on the left foot.  There is erythema with no evidence of induration or fluctuance.  There is a 2+ DP pulse in the left foot, brisk cap refill, she is able to range the left ankle however it is reduced secondary to swelling and pain.  Differential diagnosis includes septic arthritis, cellulitis,  DVT.  Ultrasound imaging of left lower extremity negative for DVT.  Patient CBC without leukocytosis, no anemia.  BMP with sodium 129, glucose 289.  This could represent pseudohyponatremia in the setting of hyperglycemia.  Plan film imaging of patient left ankle show lateral soft tissue swelling that is increased from prior study 3 days ago.  There is also suggestion of gas however on my examination I do not appreciate this.  Attending Dr. Estell Harpin does not appreciate gas on x-ray.  Most likely cause of patient pain and swelling due to cellulitis.  Patient will receive 1 g Rocephin through IV here in the department.  Patient will be discharged with Keflex which she will take 4 times daily for 10 days.  The patient will follow-up with her hospice doctor in 5 days.  The patient will return to the ED if her daughter notices any progression of the redness and swelling and warmth.  Patient case discussed with attending Dr. Estell Harpin who voices agreement with plan of management.  Final Clinical Impression(s) / ED Diagnoses Final diagnoses:  Cellulitis of left lower extremity    Rx / DC Orders ED Discharge Orders          Ordered    cephALEXin (KEFLEX) 500 MG capsule  4 times daily        03/02/23 1837              Al Decant, PA-C 03/02/23 1850    Bethann Berkshire, MD 03/04/23 1419

## 2023-03-02 NOTE — Discharge Instructions (Signed)
Return to the ED with any new symptom such as fevers, increased redness and warmth to the patient's left ankle Please read the attached guide concerning cellulitis in adults Please have the patient reevaluated in 5 days by her hospice doctor Please begin taking antibiotics that I placed you on.  You will take Keflex 4 times daily for the next 10 days.

## 2024-08-12 ENCOUNTER — Other Ambulatory Visit (HOSPITAL_COMMUNITY)
Admission: RE | Admit: 2024-08-12 | Discharge: 2024-08-12 | Disposition: A | Discharge status: Home / Self Care | Source: Skilled Nursing Facility | Attending: Internal Medicine | Admitting: Internal Medicine

## 2024-08-12 ENCOUNTER — Other Ambulatory Visit: Payer: Self-pay | Admitting: Adult Health

## 2024-08-12 ENCOUNTER — Non-Acute Institutional Stay (SKILLED_NURSING_FACILITY): Admitting: Adult Health

## 2024-08-12 ENCOUNTER — Encounter: Payer: Self-pay | Admitting: Adult Health

## 2024-08-12 DIAGNOSIS — E1149 Type 2 diabetes mellitus with other diabetic neurological complication: Secondary | ICD-10-CM | POA: Insufficient documentation

## 2024-08-12 DIAGNOSIS — K219 Gastro-esophageal reflux disease without esophagitis: Secondary | ICD-10-CM | POA: Insufficient documentation

## 2024-08-12 DIAGNOSIS — F209 Schizophrenia, unspecified: Secondary | ICD-10-CM

## 2024-08-12 DIAGNOSIS — E440 Moderate protein-calorie malnutrition: Secondary | ICD-10-CM

## 2024-08-12 DIAGNOSIS — I700 Atherosclerosis of aorta: Secondary | ICD-10-CM

## 2024-08-12 DIAGNOSIS — F0390 Unspecified dementia without behavioral disturbance: Secondary | ICD-10-CM | POA: Diagnosis not present

## 2024-08-12 LAB — COMPREHENSIVE METABOLIC PANEL WITH GFR
ALT: <5 U/L (ref 0–44)
AST: 12 U/L — ABNORMAL LOW (ref 15–41)
Albumin: 3.9 g/dL (ref 3.5–5.0)
Alkaline Phosphatase: 66 U/L (ref 38–126)
Anion gap: 9 (ref 5–15)
BUN: 11 mg/dL (ref 8–23)
CO2: 28 mmol/L (ref 22–32)
Calcium: 9.4 mg/dL (ref 8.9–10.3)
Chloride: 95 mmol/L — ABNORMAL LOW (ref 98–111)
Creatinine, Ser: 0.75 mg/dL (ref 0.44–1.00)
GFR, Estimated: >60 mL/min (ref >60)
Glucose, Bld: 272 mg/dL — ABNORMAL HIGH (ref 70–99)
Potassium: 4.2 mmol/L (ref 3.5–5.1)
Sodium: 132 mmol/L — ABNORMAL LOW (ref 135–145)
Total Bilirubin: 0.6 mg/dL (ref 0.0–1.2)
Total Protein: 6.9 g/dL (ref 6.5–8.1)

## 2024-08-12 LAB — CBC
HCT: 40.3 % (ref 36.0–46.0)
Hemoglobin: 13.4 g/dL (ref 12.0–15.0)
MCH: 28.5 pg (ref 26.0–34.0)
MCHC: 33.3 g/dL (ref 30.0–36.0)
MCV: 85.7 fL (ref 80.0–100.0)
Platelets: 227 K/uL (ref 150–400)
RBC: 4.7 MIL/uL (ref 3.87–5.11)
RDW: 13 % (ref 11.5–15.5)
WBC: 5 K/uL (ref 4.0–10.5)
nRBC: 0 % (ref 0.0–0.2)

## 2024-08-12 LAB — HEMOGLOBIN A1C
Hgb A1c MFr Bld: 10 % — ABNORMAL HIGH (ref 4.8–5.6)
Mean Plasma Glucose: 240.3 mg/dL

## 2024-08-12 MED ORDER — TRAMADOL HCL 50 MG PO TABS: 50.0000 mg | ORAL_TABLET | Freq: Three times a day (TID) | ORAL | 0 refills | Status: DC | PRN

## 2024-08-12 MED ORDER — MORPHINE SULFATE (CONCENTRATE) 10 MG /0.5 ML PO SOLN: 5.0000 mg | ORAL | 0 refills | Status: DC | PRN

## 2024-08-12 MED ORDER — LORAZEPAM 0.5 MG PO TABS: 0.5000 mg | ORAL_TABLET | ORAL | 0 refills | Status: DC | PRN

## 2024-08-12 NOTE — Progress Notes (Signed)
 Location:  [facility] Room Number: [id number] Place of Service:  SNF ([id number])   CODE STATUS: dnr   Allergies  Allergen Reactions   Contrast Media [Iodinated Contrast Media] Swelling   Metrizamide Swelling    This is a nonionic iodine -based radiocontrast agent.    Chief Complaint  Patient presents with   Acute Visit    Follow up transfer    HPI:  She is a 89 year old woman who is being transferred from her home to this facility. She has been followed by hospice care at home. Her past medical history includes: aortic atherosclerosis; dementia and diabetes. She is a poor historian. She is denying any pain; states that she is feeling good and is worried about her roommate. There are no reports of agitation; her weight remains stable. She will continue to be followed for her chronic illnesses including:  Schizophrenia unspecified type:   Moderate protein calorie malnutrition  GERD without esophagitis  Past Medical History:  Diagnosis Date   Diabetes mellitus    HOH (hard of hearing)    Poor historian    Sinus trouble     Past Surgical History:  Procedure Laterality Date   COLONOSCOPY  2007   Dr. Shaaron: tubular adenomas/rectal   COLONOSCOPY N/A 01/06/2014   [facility]FM:Rnonwpr diverticulosis. Colonic polyp-removed as described above. Status post segmental biopsy and stool collection for culture   HERNIA REPAIR     umbilical, [facility]   HIP ARTHROPLASTY Right 04/22/2022   Procedure: ARTHROPLASTY BIPOLAR HIP (HEMIARTHROPLASTY);  Surgeon: Celena Sharper, MD;  Location: [facility] OR;  Service: Orthopedics;  Laterality: Right;   HIP ARTHROPLASTY Left 01/06/2023   Procedure: LEFT HIP HEMIARTHROPLASTY;  Surgeon: Jerri Kay HERO, MD;  Location: [facility] OR;  Service: Orthopedics;  Laterality: Left;   HYSTEROSCOPY WITH D & C  05/23/2012   Procedure: DILATATION AND CURETTAGE /HYSTEROSCOPY;  Surgeon: Vonn VEAR Inch, MD;  Location: [facility] ORS;  Service: Gynecology;  Laterality: N/A;   POLYPECTOMY  05/23/2012    Procedure: POLYPECTOMY;  Surgeon: Vonn VEAR Inch, MD;  Location: [facility] ORS;  Service: Gynecology;  Laterality: N/A;  endometrial polypectomy    Social History   Socioeconomic History   Marital status: Married    Spouse name: Not on file   Number of children: Not on file   Years of education: Not on file   Highest education level: Not on file  Occupational History   Not on file  Tobacco Use   Smoking status: Never   Smokeless tobacco: Never  Vaping Use   Vaping status: Never Used  Substance and Sexual Activity   Alcohol use: No   Drug use: No   Sexual activity: Not on file  Other Topics Concern   Not on file  Social History Narrative   Not on file   Social Drivers of Health   Financial Resource Strain: Not on file  Food Insecurity: Not on file  Transportation Needs: Not on file  Physical Activity: Not on file  Stress: Not on file  Social Connections: Not on file  Intimate Partner Violence: Not on file   Family History  Problem Relation Age of Onset   Colon cancer Neg Hx       VITAL SIGNS BP (!) 140/78   Pulse 86   Temp (!) 97.1 F (36.2 C)   Resp 20   Ht 5' 4 (1.626 m)   Wt 114 lb 3.2 oz (51.8 kg)   SpO2 98%   BMI 19.60  kg/m   Outpatient Encounter Medications as of 08/12/2024  Medication Sig   loperamide (IMODIUM A-D) 2 MG tablet Take 2 mg by mouth 3 (three) times daily as needed for diarrhea or loose stools.   LORazepam  (ATIVAN ) 0.5 MG tablet Take 0.5 mg by mouth every 4 (four) hours as needed for anxiety.   Morphine  Sulfate (MORPHINE  CONCENTRATE) 10 mg / 0.5 ml concentrated solution Take 5 mg by mouth every hour as needed for severe pain (pain score 7-10) or shortness of breath.   omeprazole (PRILOSEC) 40 MG capsule Take 40 mg by mouth 2 (two) times daily.   QUEtiapine (SEROQUEL) 50 MG tablet Take 50 mg by mouth 2 (two) times daily.   traMADol  (ULTRAM ) 50 MG tablet Take 50 mg by mouth every 8 (eight) hours as needed for moderate pain (pain score 4-6).    acetaminophen  (TYLENOL ) 500 MG tablet Take 1 tablet (500 mg total) by mouth every 6 (six) hours as needed for mild pain or moderate pain.   No facility-administered encounter medications on file as of 08/12/2024.     SIGNIFICANT DIAGNOSTIC EXAMS  LABS  08-12-24: wbc 5.0; hgb 13.4; hct 40.3; mcv 85.7 plt 227 glucose 272; bun 11; creat 0.75; k+ 4.2; na++ 132; ca 9.4 gfr >60; protein 6.9 albumin 3.9   Review of Systems  Reason unable to perform ROS: unable to fully participate; very poor hearing and has poor vision.   Physical Exam Constitutional:      General: She is not in acute distress.    Appearance: She is underweight. She is not diaphoretic.     Comments: Has sunken chest  Neck:     Thyroid : No thyromegaly.  Cardiovascular:     Rate and Rhythm: Normal rate and regular rhythm.     Pulses: Normal pulses.     Heart sounds: Normal heart sounds.  Pulmonary:     Effort: Pulmonary effort is normal. No respiratory distress.     Breath sounds: Normal breath sounds.  Abdominal:     General: Bowel sounds are normal. There is no distension.     Palpations: Abdomen is soft.     Tenderness: There is no abdominal tenderness.  Musculoskeletal:        General: Normal range of motion.     Cervical back: Neck supple.     Right lower leg: No edema.     Left lower leg: No edema.  Lymphadenopathy:     Cervical: No cervical adenopathy.  Skin:    General: Skin is warm and dry.     Coloration: Skin is pale.  Neurological:     Mental Status: She is alert. Mental status is at baseline.  Psychiatric:        Mood and Affect: Mood normal.       ASSESSMENT/ PLAN:  TODAY  Aortic atherosclerosis (ct 03-18-21) not on statin due to advanced age   2. 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 Dementia without behavioral disturbance weight is 114 pounds. Is followed by  hospice  care.   4. Schizophrenia unspecified type: is presently not on medications; is emotionally stable  5. Moderate protein calorie malnutrition albumin 3.9 will monitor  6. GERD without esophagitis: will continue prilosec 40 mg daily      Barnie Seip NP [facility]  call [phone]

## 2024-08-13 ENCOUNTER — Encounter: Payer: Self-pay | Admitting: Internal Medicine

## 2024-08-13 ENCOUNTER — Non-Acute Institutional Stay (SKILLED_NURSING_FACILITY): Admitting: Internal Medicine

## 2024-08-13 DIAGNOSIS — E871 Hypo-osmolality and hyponatremia: Secondary | ICD-10-CM | POA: Diagnosis not present

## 2024-08-13 DIAGNOSIS — F0390 Unspecified dementia without behavioral disturbance: Secondary | ICD-10-CM

## 2024-08-13 DIAGNOSIS — D649 Anemia, unspecified: Secondary | ICD-10-CM | POA: Insufficient documentation

## 2024-08-13 DIAGNOSIS — E440 Moderate protein-calorie malnutrition: Secondary | ICD-10-CM | POA: Diagnosis not present

## 2024-08-13 DIAGNOSIS — E1149 Type 2 diabetes mellitus with other diabetic neurological complication: Secondary | ICD-10-CM | POA: Diagnosis not present

## 2024-08-13 DIAGNOSIS — R627 Adult failure to thrive: Secondary | ICD-10-CM | POA: Insufficient documentation

## 2024-08-13 NOTE — Assessment & Plan Note (Signed)
 Limb atrophy and interosseous wasting are present.  Nutritionist will evaluate at SNF; but her advanced dementia will hinder adherence with nutritional recommendations.

## 2024-08-13 NOTE — Assessment & Plan Note (Signed)
 She is essentially nonverbal.  She can follow no commands.  Staff reports that she is confabulating.  She was essentially nonverbal during my interview.

## 2024-08-13 NOTE — Assessment & Plan Note (Signed)
 Current A1c has risen to 10%.  Insulin  regimen adjusted.  Continue to monitor glucoses.

## 2024-08-13 NOTE — Assessment & Plan Note (Signed)
 Mild hyponatremia present with a current sodium of 135; this is improved compared to a value of 1.9 on 4/30 at an ER visit.  This is not clinically significant.

## 2024-08-13 NOTE — Progress Notes (Signed)
 NURSING HOME LOCATION:  [facility] ROOM NUMBER:  [id number]  CODE STATUS:  DNR  PCP:  Barnie Seip NP  This is a comprehensive admission note to this SNFperformed on this date less than 30 days from date of admission. Included are preadmission medical/surgical history; reconciled medication list; family history; social history and comprehensive review of systems.  Corrections and additions to the records were documented. Comprehensive physical exam was also performed. Additionally a clinical summary was entered for each active diagnosis pertinent to this admission in the Problem List to enhance continuity of care.  HPI: Hospitalist had been following the patient at home; but the family has been unable to meet her ADL needs because of her advanced age and comorbidities. She previously been at this facility after being hospitalized 3/15 - 01/31/2024.  While at home under their care she had to go back to the emergency room on 4/30 because of swelling in the left ankle after a fall in the context of generalized weakness.  She was found on the floor by her daughter-in-law.  Soft tissue swelling was noted without any fractures on imaging.  She received 1 g of Rocephin  which was transitioned to Keflex  x 10 days at discharge in the ED. Labs are current and reveal a sodium of 135; it was 129 when in the emergency room on 4/30.  Glucose was 272; A1c is 10% up from 9.7% on 01/06/2023.  Her anemia have improved with current H/H of 11.9/37.3; prior values of 11.1/34.6.  No bleeding dyscrasias have been reported.  Past medical and surgical history: Includes diabetes with vascular complications; vascular dementia; deafness; history of colon polyps; GERD; protein/caloric malnutrition; history of schizophrenia; and aortic atherosclerosis. Surgeries and procedures include colonoscopy with polypectomy; hip arthroplasty bilaterally; and hysteroscopic with D&C.  Family history: reviewed, non contributory  due to advanced age.  Social history: Nondrinker, non-smoker.   Review of systems could not be completed as she was nonverbal during my interview and exam.  The only interaction was she had repeatedly continued to grab my stethoscope as I examined her.  Physical exam: She appears her age.  Facies tend to be blank.  Eyebrows are absent.  Mouth is agape.  She has 1 remaining mandibular teeth with a second 1 eroded to the gumline.  Breath sounds are decreased.  There is a slight gallop cadence present.  (Note: The last EKG 01/06/2023 revealed sinus tachycardia with rate of 96, LAFB, and poor R wave progression on lateral leads without ischemic change).  Final pulses are decreased, especially dorsalis pedis pulses.  There is no visible swelling of the left ankle.  Interosseous wasting and limb atrophy are present.  Pertinent or positive findings: General appearance: no acute distress, increased work of breathing is present.   Lymphatic: No lymphadenopathy about the head, neck, axilla. Eyes: No conjunctival inflammation or lid edema is present. There is no scleral icterus. Ears:  External ear exam shows no significant lesions or deformities.   Nose:  External nasal examination shows no deformity or inflammation. Nasal mucosa are pink and moist without lesions, exudates Oral exam: Lips and gums are healthy appearing.There is no oropharyngeal erythema or exudate. Neck:  No thyromegaly, masses, tenderness noted.    Heart:  Normal rate and regular rhythm. S1 and S2 normal without gallop, murmur, click, rub.  Lungs: Chest clear to auscultation without wheezes, rhonchi, rales, rubs. Abdomen: Bowel sounds are normal.  Abdomen is soft and nontender with no organomegaly, hernias, masses. GU:  Deferred  Extremities:  No cyanosis, clubbing, edema. Neurologic exam:  Strength equal  in upper & lower extremities. Balance, Rhomberg, finger to nose testing could not be completed due to clinical state Deep tendon  reflexes are equal Skin: Warm & dry w/o tenting. No significant lesions or rash.  See clinical summary under each active problem in the Problem List with associated updated therapeutic plan: DM (diabetes mellitus), type 2 with neurological complications (HCC) Current A1c has risen to 10%.  Insulin  regimen adjusted.  Continue to monitor glucoses.  Dementia without behavioral disturbance (HCC) She is essentially nonverbal.  She can follow no commands.  Staff reports that she is confabulating.  She was essentially nonverbal during my interview.  Normochromic normocytic anemia Serially there is been slight improvement of the anemia with current H/H of 11.9/37.3.  No bleeding dyscrasias reported.  Hyponatremia Mild hyponatremia present with a current sodium of 135; this is improved compared to a value of 1.9 on 4/30 at an ER visit.  This is not clinically significant.  Adult failure to thrive syndrome Hospice management will not be necessary at the SNF.  DNR status and palliative care is appropriate.  Moderate protein-calorie malnutrition Limb atrophy and interosseous wasting are present.  Nutritionist will evaluate at SNF; but her advanced dementia will hinder adherence with nutritional recommendations.

## 2024-08-13 NOTE — Assessment & Plan Note (Signed)
 Hospice management will not be necessary at the SNF.  DNR status and palliative care is appropriate.

## 2024-08-13 NOTE — Assessment & Plan Note (Signed)
 Serially there is been slight improvement of the anemia with current H/H of 11.9/37.3.  No bleeding dyscrasias reported.

## 2024-08-13 NOTE — Patient Instructions (Signed)
 See assessment and plan under each diagnosis in the problem list and acutely for this visit

## 2024-08-23 ENCOUNTER — Non-Acute Institutional Stay (SKILLED_NURSING_FACILITY): Payer: Self-pay | Admitting: Adult Health

## 2024-08-23 ENCOUNTER — Encounter: Payer: Self-pay | Admitting: Adult Health

## 2024-08-23 DIAGNOSIS — R627 Adult failure to thrive: Secondary | ICD-10-CM | POA: Diagnosis not present

## 2024-08-23 DIAGNOSIS — I700 Atherosclerosis of aorta: Secondary | ICD-10-CM | POA: Diagnosis not present

## 2024-08-23 DIAGNOSIS — F209 Schizophrenia, unspecified: Secondary | ICD-10-CM

## 2024-08-23 NOTE — Progress Notes (Signed)
 Location:  [facility] Room Number: 130 Place of Service:  SNF (31)   CODE STATUS: dnr   Allergies  Allergen Reactions   Contrast Media [Iodinated Contrast Media] Swelling   Metrizamide Swelling    This is a nonionic iodine -based radiocontrast agent.    Chief Complaint  Patient presents with   Acute Visit    Care plan meeting     HPI:  We have come together for her care plan meeting. Family present. BIMS no mood 0/30. She is nonambulatory without falls. She requires moderate to dependent assist with her adl care. She is incontinent of bladder and bowel. Dietary: D1 thin liquids appetite 26-50%; setup for meals; weight is 113.2 pounds. Therapy: none at this time. Activities: one on one. 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  Imaging revealed pelvic fractures.  Rhabdomyolysis present with CK of 2000.  In reference to fall; daughter-in-law states that patient tries to ambulate without her walker.     6/20 - 04/26/2022 proximal right femoral neck fracture and displaced left superior pubic ramus fracture sustained in a mechanical fall when sh   Poor historian    Sinus trouble     Past Surgical History:  Procedure Laterality Date   COLONOSCOPY  2007   Dr. Shaaron: tubular adenomas/rectal   COLONOSCOPY N/A 01/06/2014   MFM:Rnonwpr diverticulosis. Colonic polyp-removed as described above. Status post segmental biopsy and stool collection for culture   HERNIA REPAIR     umbilical, [facility]   HIP ARTHROPLASTY Right 04/22/2022   Procedure:  ARTHROPLASTY BIPOLAR HIP (HEMIARTHROPLASTY);  Surgeon: Celena Sharper, MD;  Location: [facility] OR;  Service: Orthopedics;  Laterality: Right;   HIP ARTHROPLASTY Left 01/06/2023   Procedure: LEFT HIP HEMIARTHROPLASTY;  Surgeon: Jerri Kay HERO, MD;  Location: [facility] OR;  Service: Orthopedics;  Laterality: Left;   HYSTEROSCOPY WITH D & C  05/23/2012   Procedure: DILATATION AND CURETTAGE /HYSTEROSCOPY;  Surgeon: Vonn VEAR Inch, MD;  Location: [facility] ORS;  Service: Gynecology;  Laterality: N/A;   POLYPECTOMY  05/23/2012   Procedure: POLYPECTOMY;  Surgeon: Vonn VEAR Inch, MD;  Location: [facility] ORS;  Service: Gynecology;  Laterality: N/A;  endometrial polypectomy    Social History   Socioeconomic History   Marital status: Married    Spouse name: Not on file   Number of children: Not on file   Years of education: Not on file   Highest education level: Not on file  Occupational History   Not on file  Tobacco Use   Smoking status: Never   Smokeless tobacco: Never  Vaping Use   Vaping status: Never Used  Substance and Sexual Activity   Alcohol use: No   Drug use: No   Sexual activity: Not on file  Other Topics Concern   Not on file  Social History Narrative   Not on file   Social Drivers of Health   Financial Resource Strain: Not on file  Food Insecurity: Not on file  Transportation Needs: Not on file  Physical Activity: Not on file  Stress: Not on file  Social Connections: Not on file  Intimate Partner Violence: Not on file   Family History  Problem Relation Age of Onset   Colon cancer Neg Hx       VITAL SIGNS BP 100/76   Pulse 70   Temp 98.3 F (36.8 C)   Resp 18   Ht 5' 4 (1.626 m)   Wt 113 lb 3.2 oz (51.3 kg)   SpO2 97%   BMI 19.43 kg/m   Outpatient Encounter Medications as of 08/23/2024  Medication Sig   acetaminophen  (TYLENOL ) 500 MG tablet Take 1 tablet (500 mg total) by mouth every 6 (six) hours as needed for mild pain or moderate pain.   insulin  glargine-yfgn (SEMGLEE ) 100  UNIT/ML Pen Inject 10 Units into the skin at bedtime.   loperamide (IMODIUM A-D) 2 MG tablet Take 2 mg by mouth 3 (three) times daily as needed for diarrhea or loose stools.   omeprazole (PRILOSEC) 40 MG capsule Take 40 mg by mouth 2 (two) times daily.   QUEtiapine (SEROQUEL) 50 MG tablet Take 50 mg by mouth 2 (two) times daily.   [DISCONTINUED] LORazepam  (ATIVAN ) 0.5 MG tablet Take 1 tablet (0.5 mg total) by mouth every 4 (four) hours as needed for anxiety.   [DISCONTINUED] Morphine  Sulfate (MORPHINE  CONCENTRATE) 10 mg / 0.5 ml concentrated solution Take 0.25 mLs (5 mg total) by mouth every hour as needed for severe pain (pain score 7-10) or shortness of breath.   [DISCONTINUED] traMADol  (ULTRAM ) 50 MG tablet Take 1 tablet (50 mg total) by mouth every 8 (eight) hours as needed for moderate pain (pain score 4-6).   No facility-administered encounter medications on file as of 08/23/2024.     SIGNIFICANT DIAGNOSTIC EXAMS  LABS  08-12-24: wbc 5.0; hgb 13.4; hct 40.3; mcv 85.7 plt 227 glucose 272; bun 11; creat 0.75; k+ 4.2; na++ 132; ca 9.4 gfr >60; protein 6.9 albumin 3.9   Review of Systems  Reason unable to perform ROS: unable to fully participate; very poor hearing and has poor vision.    Physical Exam Constitutional:      General: She is not in acute distress.    Appearance: She is underweight. She is not diaphoretic.     Comments:  Has sunken chest   Neck:     Thyroid : No thyromegaly.  Cardiovascular:     Rate and Rhythm: Normal rate and regular rhythm.     Heart sounds: Normal heart sounds.  Pulmonary:     Effort: Pulmonary effort is normal. No respiratory distress.     Breath sounds: Normal breath sounds.  Abdominal:     General: Bowel sounds are normal. There is no distension.     Palpations: Abdomen is soft.     Tenderness: There is no abdominal tenderness.  Musculoskeletal:        General: Normal range of motion.     Cervical back: Neck supple.     Right lower leg:  No edema.     Left lower leg: No edema.  Lymphadenopathy:     Cervical: No cervical adenopathy.  Skin:    General: Skin is warm and dry.     Coloration: Skin is pale.  Neurological:     Mental Status: She is alert. Mental status is at baseline.  Psychiatric:        Mood and Affect: Mood normal.       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   Time spent with patient: 40 minutes: medications; activities; dietary.    Barnie Seip NP [facility]   call [phone]

## 2024-09-10 ENCOUNTER — Non-Acute Institutional Stay (SKILLED_NURSING_FACILITY): Admitting: Adult Health

## 2024-09-10 ENCOUNTER — Encounter: Payer: Self-pay | Admitting: Adult Health

## 2024-09-10 DIAGNOSIS — F0390 Unspecified dementia without behavioral disturbance: Secondary | ICD-10-CM | POA: Diagnosis not present

## 2024-09-10 DIAGNOSIS — E1149 Type 2 diabetes mellitus with other diabetic neurological complication: Secondary | ICD-10-CM

## 2024-09-10 DIAGNOSIS — Z794 Long term (current) use of insulin: Secondary | ICD-10-CM | POA: Diagnosis not present

## 2024-09-10 DIAGNOSIS — I700 Atherosclerosis of aorta: Secondary | ICD-10-CM | POA: Diagnosis not present

## 2024-09-10 NOTE — Progress Notes (Unsigned)
 Location:  [facility] Room Number: North/108/W Place of Service:  SNF (31)   CODE STATUS: DNR  Allergies  Allergen Reactions   Contrast Media [Iodinated Contrast Media] Swelling   Metrizamide Swelling    This is a nonionic iodine -based radiocontrast agent.    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 She continues to be followed by hospice care. There are no indications of pain present. Her cbg readings are improving; with some readings in the 90's.    Past Medical History:  Diagnosis Date   Closed subcapital fracture of neck of femur, left, sequela 01/06/2023   Diabetes mellitus    HOH (hard of hearing)    Pelvic fracture/Lt Superior Pubis Ramus Fracture 12/30/2021   3/2 - 01/03/2022 found down after approximately 36-48 hours from unwitnessed fall.  Imaging revealed pelvic fractures.  Rhabdomyolysis present with CK of 2000.  In reference to fall; daughter-in-law states that patient tries to ambulate without her walker.     6/20 - 04/26/2022 proximal right femoral neck fracture and displaced left superior pubic ramus fracture sustained in a mechanical fall when sh   Poor historian    Sinus trouble     Past Surgical History:  Procedure Laterality Date   COLONOSCOPY  2007   Dr. Shaaron: tubular adenomas/rectal   COLONOSCOPY N/A 01/06/2014   MFM:Rnonwpr diverticulosis. Colonic polyp-removed as described above. Status post segmental biopsy and stool collection for culture   HERNIA REPAIR     umbilical, [facility]   HIP ARTHROPLASTY Right  04/22/2022   Procedure: ARTHROPLASTY BIPOLAR HIP (HEMIARTHROPLASTY);  Surgeon: Celena Sharper, MD;  Location: [facility] OR;  Service: Orthopedics;  Laterality: Right;   HIP ARTHROPLASTY Left 01/06/2023   Procedure: LEFT HIP HEMIARTHROPLASTY;  Surgeon: Jerri Kay HERO, MD;  Location: [facility] OR;  Service: Orthopedics;  Laterality: Left;   HYSTEROSCOPY WITH D & C  05/23/2012   Procedure: DILATATION AND CURETTAGE /HYSTEROSCOPY;  Surgeon: Vonn VEAR Inch, MD;  Location: [facility] ORS;  Service: Gynecology;  Laterality: N/A;   POLYPECTOMY  05/23/2012   Procedure: POLYPECTOMY;  Surgeon: Vonn VEAR Inch, MD;  Location: [facility] ORS;  Service: Gynecology;  Laterality: N/A;  endometrial polypectomy    Social History   Socioeconomic History   Marital status: Married    Spouse name: Not on file   Number of children: Not on file   Years of education: Not on file   Highest education level: Not on file  Occupational History   Not on file  Tobacco Use   Smoking status: Never   Smokeless tobacco: Never  Vaping Use   Vaping status: Never Used  Substance and Sexual Activity   Alcohol use: No   Drug use: No   Sexual activity: Not on file  Other Topics Concern   Not on file  Social History Narrative   Not on file   Social Drivers of Health   Financial Resource Strain: Not on file  Food Insecurity: Not on file  Transportation Needs: Not on file  Physical Activity:  Not on file  Stress: Not on file  Social Connections: Not on file  Intimate Partner Violence: Not on file   Family History  Problem Relation Age of Onset   Colon cancer Neg Hx       VITAL SIGNS BP 116/70   Pulse 80   Temp 98.1 F (36.7 C)   Resp 20   Ht 5' 4 (1.626 m)   Wt 114 lb (51.7 kg)   SpO2 97%   BMI 19.57 kg/m   Outpatient Encounter Medications as of 09/10/2024  Medication Sig   acetaminophen  (TYLENOL ) 500 MG tablet Take 1 tablet (500 mg total) by mouth every 6 (six) hours as needed for mild pain or moderate pain.   Balsam [place]-Castor Oil  (VENELEX) OINT Apply topically.   Continuous Glucose Receiver (FREESTYLE LIBRE 2 READER) DEVI    Continuous Glucose Sensor (FREESTYLE LIBRE 2 SENSOR) MISC    EMBECTA AUTOSHIELD DUO 30G X 5 MM MISC Use with insulin  pens   insulin  glargine-yfgn (SEMGLEE ) 100 UNIT/ML Pen Inject 10 Units into the skin at bedtime.   loperamide (IMODIUM A-D) 2 MG tablet Take 2 mg by mouth 3 (three) times daily as needed for diarrhea or loose stools.   omeprazole (PRILOSEC) 40 MG capsule Take 40 mg by mouth 2 (two) times daily.   QUEtiapine (SEROQUEL) 50 MG tablet Take 50 mg by mouth 2 (two) times daily.   No facility-administered encounter medications on file as of 09/10/2024.     SIGNIFICANT DIAGNOSTIC EXAMS  LABS  08-12-24: wbc 5.0; hgb 13.4; hct 40.3; mcv 85.7 plt 227 glucose 272; bun 11; creat 0.75; k+ 4.2; na++ 132; ca 9.4 gfr >60; protein 6.9 albumin 3.9    Review of Systems  Reason unable to perform ROS: unable to fully participate; very poor hearing and has poor vision.    Physical Exam Constitutional:      General: She is not in acute distress.    Appearance: She is well-developed. She is not diaphoretic.     Comments:  Has sunken chest    Neck:     Thyroid : No thyromegaly.  Cardiovascular:     Rate and Rhythm: Normal rate and regular rhythm.     Heart sounds: Normal heart sounds.  Pulmonary:     Effort: Pulmonary effort is normal. No respiratory distress.     Breath sounds: Normal breath sounds.  Abdominal:     General: Bowel sounds are normal. There is no distension.     Palpations: Abdomen is soft.     Tenderness: There is no abdominal tenderness.  Musculoskeletal:        General: Normal range of motion.     Cervical back: Neck supple.     Right lower leg: No edema.     Left lower leg: No edema.  Lymphadenopathy:     Cervical: No cervical adenopathy.  Skin:    General: Skin is warm and dry.     Coloration: Skin is pale.  Neurological:     Mental Status: She is alert. Mental  status is at baseline.  Psychiatric:        Mood and Affect: Mood normal.      ASSESSMENT/ PLAN:  TODAY  Aortic atherosclerosis (ct 03-18-21) not on statin due to advanced age   2. 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    3. Dementia without behavioral disturbance weight is 114 pounds. Is followed  by  hospice care.   PREVIOUS   4. Schizophrenia unspecified type: is presently not on medications; is emotionally stable  5. Moderate protein calorie malnutrition albumin 3.9 will monitor  6. GERD without esophagitis: will continue prilosec 40 mg daily     Barnie Seip NP [facility]  call [phone]

## 2024-09-23 ENCOUNTER — Non-Acute Institutional Stay (SKILLED_NURSING_FACILITY): Payer: Self-pay | Admitting: Adult Health

## 2024-09-23 ENCOUNTER — Encounter: Payer: Self-pay | Admitting: Adult Health

## 2024-09-23 DIAGNOSIS — Z794 Long term (current) use of insulin: Secondary | ICD-10-CM

## 2024-09-23 DIAGNOSIS — E1149 Type 2 diabetes mellitus with other diabetic neurological complication: Secondary | ICD-10-CM

## 2024-09-23 NOTE — Progress Notes (Unsigned)
 Location:  [facility] Room Number: 108 Place of Service:  SNF (31)   CODE STATUS: dnr   Allergies  Allergen Reactions   Contrast Media [Iodinated Contrast Media] Swelling   Metrizamide Swelling    This is a nonionic iodine -based radiocontrast agent.    Chief Complaint  Patient presents with   Acute Visit    Diabetes mellitus     HPI:  She was recently discontinued from hospice care. She has been placed on semglee  nightly. She does not like taking shots for her diabetes and her family is wanting this medication stopped.   Past Medical History:  Diagnosis Date   Closed subcapital fracture of neck of femur, left, sequela 01/06/2023   Diabetes mellitus    HOH (hard of hearing)    Pelvic fracture/Lt Superior Pubis Ramus Fracture 12/30/2021   3/2 - 01/03/2022 found down after approximately 36-48 hours from unwitnessed fall.  Imaging revealed pelvic fractures.  Rhabdomyolysis present with CK of 2000.  In reference to fall; daughter-in-law states that patient tries to ambulate without her walker.     6/20 - 04/26/2022 proximal right femoral neck fracture and displaced left superior pubic ramus fracture sustained in a mechanical fall when sh   Poor historian    Sinus trouble     Past Surgical History:  Procedure Laterality Date   COLONOSCOPY  2007   Dr. Shaaron: tubular adenomas/rectal   COLONOSCOPY N/A 01/06/2014   MFM:Rnonwpr diverticulosis. Colonic polyp-removed as described above. Status post segmental biopsy and stool collection for culture   HERNIA REPAIR     umbilical, [facility]   HIP ARTHROPLASTY Right 04/22/2022   Procedure: ARTHROPLASTY BIPOLAR HIP (HEMIARTHROPLASTY);  Surgeon: Celena Sharper, MD;  Location: [facility] OR;  Service: Orthopedics;  Laterality: Right;   HIP ARTHROPLASTY Left 01/06/2023   Procedure: LEFT HIP HEMIARTHROPLASTY;  Surgeon: Jerri Kay HERO, MD;  Location: [facility] OR;  Service: Orthopedics;  Laterality: Left;   HYSTEROSCOPY WITH D & C  05/23/2012    Procedure: DILATATION AND CURETTAGE /HYSTEROSCOPY;  Surgeon: Vonn VEAR Inch, MD;  Location: [facility] ORS;  Service: Gynecology;  Laterality: N/A;   POLYPECTOMY  05/23/2012   Procedure: POLYPECTOMY;  Surgeon: Vonn VEAR Inch, MD;  Location: [facility] ORS;  Service: Gynecology;  Laterality: N/A;  endometrial polypectomy    Social History   Socioeconomic History   Marital status: Married    Spouse name: Not on file   Number of children: Not on file   Years of education: Not on file   Highest education level: Not on file  Occupational History   Not on file  Tobacco Use   Smoking status: Never   Smokeless tobacco: Never  Vaping Use   Vaping status: Never Used  Substance and Sexual Activity   Alcohol use: No   Drug use: No   Sexual activity: Not on file  Other Topics Concern   Not on file  Social History Narrative   Not on file   Social Drivers of Health   Financial Resource Strain: Not on file  Food Insecurity: Not on file  Transportation Needs: Not on file  Physical Activity: Not on file  Stress: Not on file  Social Connections: Not on file  Intimate Partner Violence: Not on file   Family History  Problem Relation Age of Onset   Colon cancer Neg Hx       VITAL SIGNS BP 118/77   Pulse 86   Temp 98 F (36.7 C)   Resp 18  Ht 5' 4 (1.626 m)   Wt 114 lb (51.7 kg)   SpO2 96%   BMI 19.57 kg/m   Outpatient Encounter Medications as of 09/23/2024  Medication Sig   acetaminophen  (TYLENOL ) 500 MG tablet Take 1 tablet (500 mg total) by mouth every 6 (six) hours as needed for mild pain or moderate pain.   Balsam [place]-Castor Oil (VENELEX) OINT Apply topically.   Continuous Glucose Receiver (FREESTYLE LIBRE 2 READER) DEVI    Continuous Glucose Sensor (FREESTYLE LIBRE 2 SENSOR) MISC    EMBECTA AUTOSHIELD DUO 30G X 5 MM MISC Use with insulin  pens   insulin  glargine-yfgn (SEMGLEE ) 100 UNIT/ML Pen Inject 10 Units into the skin at bedtime.   loperamide (IMODIUM A-D) 2 MG tablet Take 2 mg by  mouth 3 (three) times daily as needed for diarrhea or loose stools.   omeprazole (PRILOSEC) 40 MG capsule Take 40 mg by mouth 2 (two) times daily.   QUEtiapine (SEROQUEL) 50 MG tablet Take 50 mg by mouth 2 (two) times daily.   No facility-administered encounter medications on file as of 09/23/2024.     SIGNIFICANT DIAGNOSTIC EXAMS  LABS  08-12-24: wbc 5.0; hgb 13.4; hct 40.3; mcv 85.7 plt 227 glucose 272; bun 11; creat 0.75; k+ 4.2; na++ 132; ca 9.4 gfr >60; protein 6.9 albumin 3.9   Review of Systems  Constitutional:  Negative for malaise/fatigue.  Respiratory:  Negative for cough and shortness of breath.   Cardiovascular:  Negative for chest pain and leg swelling.  Gastrointestinal:  Negative for abdominal pain, constipation and heartburn.  Musculoskeletal:  Negative for back pain, joint pain and myalgias.  Skin: Negative.   Psychiatric/Behavioral:  The patient is not nervous/anxious.    Physical Exam Constitutional:      General: She is not in acute distress.    Appearance: She is well-developed. She is not diaphoretic.     Comments: Has sunken chest     Neck:     Thyroid : No thyromegaly.  Cardiovascular:     Rate and Rhythm: Normal rate and regular rhythm.     Heart sounds: Normal heart sounds.  Pulmonary:     Effort: Pulmonary effort is normal. No respiratory distress.     Breath sounds: Normal breath sounds.  Abdominal:     General: Bowel sounds are normal. There is no distension.     Palpations: Abdomen is soft.     Tenderness: There is no abdominal tenderness.  Musculoskeletal:        General: Normal range of motion.     Cervical back: Neck supple.     Right lower leg: No edema.     Left lower leg: No edema.  Lymphadenopathy:     Cervical: No cervical adenopathy.  Skin:    General: Skin is warm and dry.     Coloration: Skin is pale.  Neurological:     Mental Status: She is alert. Mental status is at baseline.  Psychiatric:        Mood and Affect: Mood  normal.     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

## 2024-10-03 ENCOUNTER — Encounter: Payer: Self-pay | Admitting: Adult Health

## 2024-10-03 ENCOUNTER — Non-Acute Institutional Stay (SKILLED_NURSING_FACILITY): Payer: Self-pay | Admitting: Adult Health

## 2024-10-03 DIAGNOSIS — E1149 Type 2 diabetes mellitus with other diabetic neurological complication: Secondary | ICD-10-CM

## 2024-10-03 NOTE — Progress Notes (Signed)
 Location:  [facility] Room Number: [id number] Place of Service:  SNF (31)   CODE STATUS: dnr   Allergies  Allergen Reactions   Contrast Media [Iodinated Contrast Media] Swelling   Metrizamide Swelling    This is a nonionic iodine -based radiocontrast agent.    Chief Complaint  Patient presents with   Acute Visit    Diabetes mellitus     HPI:  Her cbg readings have gotten worse since stopping semglee . The range in the upper 200's through upper 300's. She is presently taking metformin 500 mg daily   Past Medical History:  Diagnosis Date   Closed subcapital fracture of neck of femur, left, sequela 01/06/2023   Diabetes mellitus    HOH (hard of hearing)    Pelvic fracture/Lt Superior Pubis Ramus Fracture 12/30/2021   3/2 - 01/03/2022 found down after approximately 36-48 hours from unwitnessed fall.  Imaging revealed pelvic fractures.  Rhabdomyolysis present with CK of 2000.  In reference to fall; daughter-in-law states that patient tries to ambulate without her walker.     6/20 - 04/26/2022 proximal right femoral neck fracture and displaced left superior pubic ramus fracture sustained in a mechanical fall when sh   Poor historian    Sinus trouble     Past Surgical History:  Procedure Laterality Date   COLONOSCOPY  2007   Dr. Shaaron: tubular adenomas/rectal   COLONOSCOPY N/A 01/06/2014   MFM:Rnonwpr diverticulosis. Colonic polyp-removed as described above. Status post segmental biopsy and stool collection for culture   HERNIA REPAIR     umbilical, [facility]   HIP ARTHROPLASTY Right 04/22/2022   Procedure: ARTHROPLASTY BIPOLAR HIP (HEMIARTHROPLASTY);  Surgeon: Celena Sharper, MD;  Location: [facility] OR;  Service: Orthopedics;  Laterality: Right;   HIP ARTHROPLASTY Left 01/06/2023   Procedure: LEFT HIP HEMIARTHROPLASTY;  Surgeon: Jerri Kay HERO, MD;  Location: [facility] OR;  Service: Orthopedics;  Laterality: Left;   HYSTEROSCOPY WITH D & C  05/23/2012   Procedure: DILATATION AND  CURETTAGE /HYSTEROSCOPY;  Surgeon: Vonn VEAR Inch, MD;  Location: [facility] ORS;  Service: Gynecology;  Laterality: N/A;   POLYPECTOMY  05/23/2012   Procedure: POLYPECTOMY;  Surgeon: Vonn VEAR Inch, MD;  Location: [facility] ORS;  Service: Gynecology;  Laterality: N/A;  endometrial polypectomy    Social History   Socioeconomic History   Marital status: Married    Spouse name: Not on file   Number of children: Not on file   Years of education: Not on file   Highest education level: Not on file  Occupational History   Not on file  Tobacco Use   Smoking status: Never   Smokeless tobacco: Never  Vaping Use   Vaping status: Never Used  Substance and Sexual Activity   Alcohol use: No   Drug use: No   Sexual activity: Not on file  Other Topics Concern   Not on file  Social History Narrative   Not on file   Social Drivers of Health   Financial Resource Strain: Not on file  Food Insecurity: Not on file  Transportation Needs: Not on file  Physical Activity: Not on file  Stress: Not on file  Social Connections: Not on file  Intimate Partner Violence: Not on file   Family History  Problem Relation Age of Onset   Colon cancer Neg Hx       VITAL SIGNS BP 121/79   Pulse 88   Temp (!) 97 F (36.1 C)   Resp 18   Ht 5'  4 (1.626 m)   Wt 121 lb 9.6 oz (55.2 kg)   SpO2 93%   BMI 20.87 kg/m   Outpatient Encounter Medications as of 10/03/2024  Medication Sig   acetaminophen  (TYLENOL ) 500 MG tablet Take 1 tablet (500 mg total) by mouth every 6 (six) hours as needed for mild pain or moderate pain.   Balsam [place]-Castor Oil (VENELEX) OINT Apply topically.   Continuous Glucose Receiver (FREESTYLE LIBRE 2 READER) DEVI    Continuous Glucose Sensor (FREESTYLE LIBRE 2 SENSOR) MISC    EMBECTA AUTOSHIELD DUO 30G X 5 MM MISC Use with insulin  pens   loperamide (IMODIUM A-D) 2 MG tablet Take 2 mg by mouth 3 (three) times daily as needed for diarrhea or loose stools.   metFORMIN (GLUCOPHAGE) 500 MG tablet  Take 500 mg by mouth daily with breakfast.   omeprazole (PRILOSEC) 40 MG capsule Take 40 mg by mouth 2 (two) times daily.   QUEtiapine (SEROQUEL) 50 MG tablet Take 50 mg by mouth 2 (two) times daily.   No facility-administered encounter medications on file as of 10/03/2024.     SIGNIFICANT DIAGNOSTIC EXAMS  LABS  08-12-24: wbc 5.0; hgb 13.4; hct 40.3; mcv 85.7 plt 227 glucose 272; bun 11; creat 0.75; k+ 4.2; na++ 132; ca 9.4 gfr >60; protein 6.9 albumin 3.9 hgb A1c 10.0   Review of Systems  Constitutional:  Negative for malaise/fatigue.  Respiratory:  Negative for cough and shortness of breath.   Cardiovascular:  Negative for chest pain, palpitations and leg swelling.  Gastrointestinal:  Negative for abdominal pain, constipation and heartburn.  Musculoskeletal:  Negative for back pain, joint pain and myalgias.  Skin: Negative.   Neurological:  Negative for dizziness.  Psychiatric/Behavioral:  The patient is not nervous/anxious.    Physical Exam Constitutional:      General: She is not in acute distress.    Appearance: She is well-developed. She is not diaphoretic.     Comments: Has sunken chest   Neck:     Thyroid : No thyromegaly.  Cardiovascular:     Rate and Rhythm: Normal rate and regular rhythm.     Heart sounds: Normal heart sounds.  Pulmonary:     Effort: Pulmonary effort is normal. No respiratory distress.     Breath sounds: Normal breath sounds.  Abdominal:     General: Bowel sounds are normal. There is no distension.     Palpations: Abdomen is soft.     Tenderness: There is no abdominal tenderness.  Musculoskeletal:        General: Normal range of motion.     Cervical back: Neck supple.     Right lower leg: No edema.     Left lower leg: No edema.  Lymphadenopathy:     Cervical: No cervical adenopathy.  Skin:    General: Skin is warm and dry.     Coloration: Skin is pale.  Neurological:     Mental Status: She is alert. Mental status is at baseline.   Psychiatric:        Mood and Affect: Mood normal.       ASSESSMENT/ PLAN:  TODAY  DM (diabetes mellitus) type 2 with neurological complications: hgb A1c 10.0 will continue metformin 500 mg daily and will begin rybelsis 3 mg daily and will monitor her status.       Barnie Seip NP [facility]  call [phone]

## 2024-10-21 ENCOUNTER — Non-Acute Institutional Stay (SKILLED_NURSING_FACILITY): Admitting: Adult Health

## 2024-10-21 ENCOUNTER — Encounter: Payer: Self-pay | Admitting: Adult Health

## 2024-10-21 DIAGNOSIS — E1149 Type 2 diabetes mellitus with other diabetic neurological complication: Secondary | ICD-10-CM | POA: Diagnosis not present

## 2024-10-21 DIAGNOSIS — Z7984 Long term (current) use of oral hypoglycemic drugs: Secondary | ICD-10-CM | POA: Diagnosis not present

## 2024-10-21 DIAGNOSIS — E440 Moderate protein-calorie malnutrition: Secondary | ICD-10-CM | POA: Diagnosis not present

## 2024-10-21 DIAGNOSIS — R627 Adult failure to thrive: Secondary | ICD-10-CM

## 2024-10-21 DIAGNOSIS — F209 Schizophrenia, unspecified: Secondary | ICD-10-CM | POA: Diagnosis not present

## 2024-10-21 NOTE — Progress Notes (Signed)
 " Location:  [facility] Room Number: 108 Place of Service:  SNF (31)   CODE STATUS: dnr   Allergies[1]  Chief Complaint  Patient presents with   Medical Management of Chronic Issues                    DM (diabetes mellitus) type 2 with neurological complications  Schizophrenia unspecified type:    Moderate protein calorie malnutrition    HPI:  She is a 89 y.o. long term resident of this facility being seen for the management of her chronic illnesses: DM (diabetes mellitus) type 2 with neurological complications  Schizophrenia unspecified type:    Moderate protein calorie malnutrition. Her cbg readings remain in the 300 range. Her family does not want her to receive injections. She is presently taking rybelsus 3 mg daily. She will spit out medications.    Past Medical History:  Diagnosis Date   Closed subcapital fracture of neck of femur, left, sequela 01/06/2023   Diabetes mellitus    HOH (hard of hearing)    Pelvic fracture/Lt Superior Pubis Ramus Fracture 12/30/2021   3/2 - 01/03/2022 found down after approximately 36-48 hours from unwitnessed fall.  Imaging revealed pelvic fractures.  Rhabdomyolysis present with CK of 2000.  In reference to fall; daughter-in-law states that patient tries to ambulate without her walker.     6/20 - 04/26/2022 proximal right femoral neck fracture and displaced left superior pubic ramus fracture sustained in a mechanical fall when sh   Poor historian    Sinus trouble     Past Surgical History:  Procedure Laterality Date   COLONOSCOPY  2007   Dr. Shaaron: tubular adenomas/rectal   COLONOSCOPY N/A 01/06/2014   MFM:Rnonwpr diverticulosis. Colonic polyp-removed as described above. Status post segmental biopsy and stool collection for culture   HERNIA REPAIR     umbilical, [facility]   HIP ARTHROPLASTY Right 04/22/2022   Procedure: ARTHROPLASTY BIPOLAR HIP (HEMIARTHROPLASTY);  Surgeon: Celena Sharper, MD;  Location: [facility] OR;  Service:  Orthopedics;  Laterality: Right;   HIP ARTHROPLASTY Left 01/06/2023   Procedure: LEFT HIP HEMIARTHROPLASTY;  Surgeon: Jerri Kay HERO, MD;  Location: [facility] OR;  Service: Orthopedics;  Laterality: Left;   HYSTEROSCOPY WITH D & C  05/23/2012   Procedure: DILATATION AND CURETTAGE /HYSTEROSCOPY;  Surgeon: Vonn VEAR Inch, MD;  Location: [facility] ORS;  Service: Gynecology;  Laterality: N/A;   POLYPECTOMY  05/23/2012   Procedure: POLYPECTOMY;  Surgeon: Vonn VEAR Inch, MD;  Location: [facility] ORS;  Service: Gynecology;  Laterality: N/A;  endometrial polypectomy    Social History   Socioeconomic History   Marital status: Married    Spouse name: Not on file   Number of children: Not on file   Years of education: Not on file   Highest education level: Not on file  Occupational History   Not on file  Tobacco Use   Smoking status: Never   Smokeless tobacco: Never  Vaping Use   Vaping status: Never Used  Substance and Sexual Activity   Alcohol use: No   Drug use: No   Sexual activity: Not on file  Other Topics Concern   Not on file  Social History Narrative   Not on file   Social Drivers of Health   Tobacco Use: Low Risk (10/21/2024)   Patient History    Smoking Tobacco Use: Never    Smokeless Tobacco Use: Never    Passive Exposure: Not on file  Financial Resource Strain:  Not on file  Food Insecurity: Not on file  Transportation Needs: Not on file  Physical Activity: Not on file  Stress: Not on file  Social Connections: Not on file  Intimate Partner Violence: Not on file  Depression (PHQ2-9): Not on file  Alcohol Screen: Not on file  Housing: Not on file  Utilities: Not on file  Health Literacy: Not on file   Family History  Problem Relation Age of Onset   Colon cancer Neg Hx       VITAL SIGNS BP 121/74   Pulse 74   Temp 98.4 F (36.9 C)   Resp 20   Ht 5' 4 (1.626 m)   Wt 121 lb 9.6 oz (55.2 kg)   SpO2 98%   BMI 20.87 kg/m   Outpatient Encounter Medications as of 10/21/2024   Medication Sig   acetaminophen  (TYLENOL ) 500 MG tablet Take 1 tablet (500 mg total) by mouth every 6 (six) hours as needed for mild pain or moderate pain.   Balsam [place]-Castor Oil (VENELEX) OINT Apply topically.   Continuous Glucose Receiver (FREESTYLE LIBRE 2 READER) DEVI    Continuous Glucose Sensor (FREESTYLE LIBRE 2 SENSOR) MISC    EMBECTA AUTOSHIELD DUO 30G X 5 MM MISC Use with insulin  pens   loperamide (IMODIUM A-D) 2 MG tablet Take 2 mg by mouth 3 (three) times daily as needed for diarrhea or loose stools.   metFORMIN (GLUCOPHAGE) 500 MG tablet Take 500 mg by mouth daily with breakfast.   omeprazole (PRILOSEC) 40 MG capsule Take 40 mg by mouth 2 (two) times daily.   QUEtiapine (SEROQUEL) 50 MG tablet Take 50 mg by mouth 2 (two) times daily.   Semaglutide (RYBELSUS) 3 MG TABS Take 3 mg by mouth daily at 12 noon.   No facility-administered encounter medications on file as of 10/21/2024.     SIGNIFICANT DIAGNOSTIC EXAMS  LABS  08-12-24: wbc 5.0; hgb 13.4; hct 40.3; mcv 85.7 plt 227 glucose 272; bun 11; creat 0.75; k+ 4.2; na++ 132; ca 9.4 gfr >60; protein 6.9 albumin 3.9 hgb A1c 10.0   Review of Systems  Reason unable to perform ROS: unable to fully participate; very poor hearing and has poor vision.    Physical Exam Constitutional:      General: She is not in acute distress.    Appearance: She is well-developed. She is not diaphoretic.     Comments: Has sunken chest  Neck:     Thyroid : No thyromegaly.  Cardiovascular:     Rate and Rhythm: Normal rate and regular rhythm.     Heart sounds: Normal heart sounds.  Pulmonary:     Effort: Pulmonary effort is normal. No respiratory distress.     Breath sounds: Normal breath sounds.  Abdominal:     General: Bowel sounds are normal. There is no distension.     Palpations: Abdomen is soft.     Tenderness: There is no abdominal tenderness.  Musculoskeletal:        General: Normal range of motion.     Cervical back: Neck  supple.     Right lower leg: No edema.     Left lower leg: No edema.  Lymphadenopathy:     Cervical: No cervical adenopathy.  Skin:    General: Skin is warm and dry.  Neurological:     Mental Status: She is alert. Mental status is at baseline.  Psychiatric:        Mood and Affect: Mood normal.  ASSESSMENT/ PLAN:  TODAY  1. DM (diabetes mellitus) type 2 with neurological complications hgb A1c 10.0. her family does not want injections. Will increase her rybelsus to 7 mg daily; she will spit out medications.   2. Schizophrenia unspecified type: is currently not on medications; her emotional state is stable.   3. Moderate protein calorie malnutrition; albumin 3.9     PREVIOUS   4. GERD without esophagitis: will continue prilosec 40 mg daily   5. Aortic atherosclerosis (ct 03-18-21) not on statin due to advanced age   11. Dementia without behavioral disturbance weight is 121 pounds.    Barnie Seip NP [facility]   call [phone]     [1]  Allergies Allergen Reactions   Contrast Media [Iodinated Contrast Media] Swelling   Metrizamide Swelling    This is a nonionic iodine -based radiocontrast agent.   "

## 2024-10-26 ENCOUNTER — Other Ambulatory Visit (HOSPITAL_COMMUNITY)
Admission: RE | Admit: 2024-10-26 | Discharge: 2024-10-26 | Disposition: A | Discharge status: Home / Self Care | Source: Skilled Nursing Facility | Attending: Adult Health | Admitting: Adult Health

## 2024-10-26 DIAGNOSIS — E1159 Type 2 diabetes mellitus with other circulatory complications: Secondary | ICD-10-CM | POA: Diagnosis present

## 2024-10-26 LAB — BASIC METABOLIC PANEL WITH GFR
Anion gap: 5 (ref 5–15)
BUN: 14 mg/dL (ref 8–23)
CO2: 32 mmol/L (ref 22–32)
Calcium: 9 mg/dL (ref 8.9–10.3)
Chloride: 92 mmol/L — ABNORMAL LOW (ref 98–111)
Creatinine, Ser: 0.72 mg/dL (ref 0.44–1.00)
GFR, Estimated: >60 mL/min
Glucose, Bld: 332 mg/dL — ABNORMAL HIGH (ref 70–99)
Potassium: 4.6 mmol/L (ref 3.5–5.1)
Sodium: 130 mmol/L — ABNORMAL LOW (ref 135–145)

## 2024-11-12 ENCOUNTER — Encounter: Payer: Self-pay | Admitting: Internal Medicine

## 2024-11-12 ENCOUNTER — Non-Acute Institutional Stay (SKILLED_NURSING_FACILITY): Admitting: Internal Medicine

## 2024-11-12 DIAGNOSIS — F0390 Unspecified dementia without behavioral disturbance: Secondary | ICD-10-CM

## 2024-11-12 DIAGNOSIS — E1149 Type 2 diabetes mellitus with other diabetic neurological complication: Secondary | ICD-10-CM

## 2024-11-12 DIAGNOSIS — Z7984 Long term (current) use of oral hypoglycemic drugs: Secondary | ICD-10-CM | POA: Diagnosis not present

## 2024-11-12 DIAGNOSIS — R627 Adult failure to thrive: Secondary | ICD-10-CM | POA: Diagnosis not present

## 2024-11-12 NOTE — Progress Notes (Unsigned)
"  ° °  NURSING HOME LOCATION:  [facility] ROOM NUMBER: [id number]  CODE STATUS: DNR  PCP: Pcp, No   This is a nursing facility follow up visit  of chronic medical diagnoses to document compliance with Regulation 483.30 (c) in The Long Term Care Survey Manual Phase 2 which mandates caregiver visit ( visits can alternate among physician, PA or NP as per statutes) within 10 days of 30 days / 60 days/ 90 days post admission to SNF date  .  Interim medical record and care since last SNF visit was updated with review of diagnostic studies and change in clinical status since last visit were documented.  HPI: She is a permanent resident of this facility with medical diagnoses of history of colon polyps; history of schizophrenia; history of normochromic, normocytic anemia; moderate protein/caloric malnutrition; GERD; diabetes with neurovascular complications; adult failure to thrive syndrome; and aortic atherosclerosis.  Labs are current as of 10/26/2024.  There is been slight progression of the hyponatremia with a current sodium level of 130.  Glucoses have ranged from 264 up to 340 with an average of 296.  A1c on 08/12/2024 was 10%, indicating suboptimal control.  She is on metformin 500 mg daily and Rybelsus 3 mg daily.  Her family has refused insulin  as they feel she is experiencing pain with the needlesticks. There is no TSH on record.  Review of systems could not be completed because of deafness, essential nonverbal status, and inability to follow commands.  Physical exam:  Pertinent or positive findings: She appears her age and chronically ill.  Facies are markedly weathered.  Countenance is blank.  Mouth tends to be agape.  The maxilla appears to be edentulous and she only has few mandibular teeth, some of which are warm to the gumline.  Exam was limited by her inability to follow commands.  Breath sounds are decreased.  Slight gallop cadence is present.  Abdomen is protuberant.  Pedal  pulses are palpable she has trace-1/2+ edema at the left sock line.  Interosseous wasting are noted. General appearance: Adequately nourished; no acute distress, increased work of breathing is present.   Lymphatic: No lymphadenopathy about the head, neck, axilla. Eyes: No conjunctival inflammation or lid edema is present. There is no scleral icterus. Ears:  External ear exam shows no significant lesions or deformities.   Nose:  External nasal examination shows no deformity or inflammation. Nasal mucosa are pink and moist without lesions, exudates Oral exam:  Lips and gums are healthy appearing. There is no oropharyngeal erythema or exudate. Neck:  No thyromegaly, masses, tenderness noted.    Heart:  Normal rate and regular rhythm. S1 and S2 normal without gallop, murmur, click, rub .  Lungs: Chest clear to auscultation without wheezes, rhonchi, rales, rubs. Abdomen: Bowel sounds are normal. Abdomen is soft and nontender with no organomegaly, hernias, masses. GU: Deferred  Extremities:  No cyanosis, clubbing  Neurologic exam : Cn 2-7 intact Strength equal  in upper & lower extremities Balance, Rhomberg, finger to nose testing could not be completed due to clinical state Deep tendon reflexes are equal Skin: Warm & dry w/o tenting. No significant lesions or rash.  See summary under each active problem in the Problem List with associated updated therapeutic plan  "

## 2024-11-12 NOTE — Assessment & Plan Note (Signed)
 Staff reports no real behavioral issues.  During my exam she was essentially nonverbal and could not follow no commands.

## 2024-11-12 NOTE — Assessment & Plan Note (Signed)
 Current A1c is 10% which would be associated with a 60% increased risk of heart attack or stroke.  Metformin will be increased to 500 mg twice daily.

## 2024-11-12 NOTE — Patient Instructions (Signed)
 See assessment and plan under each diagnosis in the problem list and acutely for this visit

## 2024-11-12 NOTE — Assessment & Plan Note (Signed)
 Her vascular dementia hinders her ability to understand and adhere to optimal nutrition intake and diabetic management.
